# Patient Record
Sex: Male | Born: 1970
Health system: Southern US, Community
[De-identification: ages and names within clinical notes are randomized; demographics above are authoritative.]

## PROBLEM LIST (undated history)

## (undated) DIAGNOSIS — K219 Gastro-esophageal reflux disease without esophagitis: Secondary | ICD-10-CM

## (undated) DIAGNOSIS — E785 Hyperlipidemia, unspecified: Secondary | ICD-10-CM

## (undated) DIAGNOSIS — J45909 Unspecified asthma, uncomplicated: Secondary | ICD-10-CM

## (undated) DIAGNOSIS — M961 Postlaminectomy syndrome, not elsewhere classified: Secondary | ICD-10-CM

## (undated) DIAGNOSIS — I10 Essential (primary) hypertension: Secondary | ICD-10-CM

## (undated) DIAGNOSIS — G8929 Other chronic pain: Secondary | ICD-10-CM

## (undated) DIAGNOSIS — J189 Pneumonia, unspecified organism: Secondary | ICD-10-CM

## (undated) DIAGNOSIS — F32A Depression, unspecified: Secondary | ICD-10-CM

## (undated) DIAGNOSIS — Z9289 Personal history of other medical treatment: Secondary | ICD-10-CM

## (undated) DIAGNOSIS — F329 Major depressive disorder, single episode, unspecified: Secondary | ICD-10-CM

## (undated) DIAGNOSIS — T7840XA Allergy, unspecified, initial encounter: Secondary | ICD-10-CM

## (undated) HISTORY — DX: Unspecified asthma, uncomplicated: J45.909

## (undated) HISTORY — DX: Hyperlipidemia, unspecified: E78.5

## (undated) HISTORY — DX: Essential (primary) hypertension: I10

## (undated) HISTORY — DX: Personal history of other medical treatment: Z92.89

## (undated) HISTORY — PX: SPINAL FUSION: SHX223

## (undated) HISTORY — DX: Allergy, unspecified, initial encounter: T78.40XA

## (undated) HISTORY — PX: BACK SURGERY: SHX140

## (undated) HISTORY — DX: Postlaminectomy syndrome, not elsewhere classified: M96.1

## (undated) HISTORY — PX: ANKLE SURGERY: SHX546

---

## 1992-04-23 HISTORY — PX: HERNIA REPAIR: SHX51

## 2013-03-13 ENCOUNTER — Ambulatory Visit: Payer: Self-pay | Admitting: Internal Medicine

## 2013-04-23 DIAGNOSIS — J189 Pneumonia, unspecified organism: Secondary | ICD-10-CM

## 2013-04-23 HISTORY — DX: Pneumonia, unspecified organism: J18.9

## 2013-07-10 ENCOUNTER — Institutional Professional Consult (permissible substitution): Payer: Self-pay | Admitting: Internal Medicine

## 2013-07-17 ENCOUNTER — Ambulatory Visit (INDEPENDENT_AMBULATORY_CARE_PROVIDER_SITE_OTHER): Payer: BC Managed Care – PPO | Admitting: Internal Medicine

## 2013-07-17 ENCOUNTER — Encounter: Payer: Self-pay | Admitting: Internal Medicine

## 2013-07-17 VITALS — BP 136/80 | HR 88 | Temp 98.6°F | Ht 72.0 in | Wt 232.0 lb

## 2013-07-17 DIAGNOSIS — R058 Other specified cough: Secondary | ICD-10-CM

## 2013-07-17 DIAGNOSIS — R059 Cough, unspecified: Secondary | ICD-10-CM

## 2013-07-17 DIAGNOSIS — R05 Cough: Secondary | ICD-10-CM

## 2013-07-17 MED ORDER — TRAMADOL HCL 50 MG PO TABS: ORAL_TABLET | ORAL | Status: DC

## 2013-07-17 MED ORDER — PANTOPRAZOLE SODIUM 40 MG PO TBEC: 40.0000 mg | DELAYED_RELEASE_TABLET | Freq: Every day | ORAL | Status: DC

## 2013-07-17 MED ORDER — FAMOTIDINE 20 MG PO TABS: ORAL_TABLET | ORAL | Status: DC

## 2013-07-17 MED ORDER — PREDNISONE 10 MG PO TABS: ORAL_TABLET | ORAL | Status: DC

## 2013-07-17 MED ORDER — MONTELUKAST SODIUM 10 MG PO TABS: 10.0000 mg | ORAL_TABLET | Freq: Every day | ORAL | Status: DC

## 2013-07-17 NOTE — Patient Instructions (Addendum)
singulair 10 mg one each evening is a maintenance medication on trial   Only use albuterol if short of breath up to 2 every 4 hours   Prednisone 10 mg take  4 each am x 2 days,   2 each am x 2 days,  1 each am x 2 days and stop   Take delsym two tsp every 12 hours and supplement if needed with  tramadol 50 mg up to 2 every 4 hours to suppress the urge to cough. Swallowing water or using ice chips/non mint and menthol containing candies (such as lifesavers or sugarless jolly ranchers) are also effective.  You should rest your voice and avoid activities that you know make you cough.  Once you have eliminated the cough for 3 straight days try reducing the tramadol first,  then the delsym as tolerated.    Pantoprazole (protonix) 40 mg   Take 30-60 min before first meal of the day and Pepcid 20 mg one bedtime until return to office - this is the best way to tell whether stomach acid is contributing to your problem.    GERD (REFLUX)  is an extremely common cause of respiratory symptoms, many times with no significant heartburn at all.    It can be treated with medication, but also with lifestyle changes including avoidance of late meals, excessive alcohol, smoking cessation, and avoid fatty foods, chocolate, peppermint, colas, red wine, and acidic juices such as orange juice.  NO MINT OR MENTHOL PRODUCTS SO NO COUGH DROPS  USE SUGARLESS CANDY INSTEAD (jolley ranchers or Stover's)  NO OIL BASED VITAMINS - use powdered substitutes.    Please schedule a follow up office visit in 4 weeks, sooner if needed

## 2013-07-17 NOTE — Progress Notes (Addendum)
   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  Prev allergy eval in [place] [facility] cats  (n/a for him) referred 07/17/2013 by Dr Duane LopeAlan Ross for cough eval.    07/17/2013 1st  Pulmonary office visit/ Tanessa Tidd  Chief Complaint  Patient presents with  . Pulmonary Consult    Referred per Dr. Sigmund HazelLisa Miller. Pt c/o persistant cough for the past 3 years, but worse for the past year. He also c/o DOE with minimal exertion.    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   qvar not helping - already rx with zpak and prednisone.  Only thing that really knocks it out is tussionex.  Sob mostly just with coughing.  Cough tends to be worse after supper and disturbs sleep, never coughs up anything much at all  No obvious other patterns in day to day or daytime variabilty or assoc cp or chest tightness, subjective wheeze overt sinus or hb symptoms. No unusual exp hx or h/o childhood pna/ asthma or knowledge of premature birth.  Sleeping ok without nocturnal  or early am exacerbation  of respiratory  c/o's or need for noct saba. Also denies any obvious fluctuation of symptoms with weather or environmental changes or other aggravating or alleviating factors except as outlined above   Current Medications, Allergies, Complete Past Medical History, Past Surgical History, Family History, and Social History were reviewed in [facility] electronic medical record.             Review of Systems  Constitutional: Negative for  fever, chills, activity change, appetite change and unexpected weight change.  HENT: Positive for sore throat. Negative for congestion, dental problem, postnasal drip, rhinorrhea, sneezing, trouble swallowing and voice change.   Eyes: Negative for visual disturbance.  Respiratory: Positive for cough and shortness of breath. Negative for choking.   Cardiovascular: Negative for chest pain and leg swelling.  Gastrointestinal: Negative for nausea, vomiting and abdominal pain.  Genitourinary: Negative for difficulty urinating.  Musculoskeletal: Negative for arthralgias.  Skin: Negative for rash.  Psychiatric/Behavioral: Negative for behavioral problems and confusion.       Objective:   Physical Exam  amb wm nad  Wt Readings from Last 3 Encounters:  07/17/13 232 lb (105.235 kg)     HEENT: nl dentition, turbinates, and orophanx. Nl external ear canals without cough reflex   NECK :  without JVD/Nodes/TM/ nl carotid upstrokes bilaterally   LUNGS: no acc muscle use,  Cough on exp harsh barking dry quality  CV:  RRR  no s3 or murmur or increase in P2, no edema   ABD:  soft and nontender with nl excursion in the supine position. No bruits or organomegaly, bowel sounds nl  MS:  warm without deformities, calf tenderness, cyanosis or clubbing  SKIN: warm and dry without lesions    NEURO:  alert, approp, no deficits           Assessment & Plan:

## 2013-07-18 DIAGNOSIS — R05 Cough: Secondary | ICD-10-CM | POA: Insufficient documentation

## 2013-07-18 DIAGNOSIS — R058 Other specified cough: Secondary | ICD-10-CM | POA: Insufficient documentation

## 2013-07-18 NOTE — Assessment & Plan Note (Signed)
The most common causes of chronic cough in immunocompetent adults include the following: upper airway cough syndrome (UACS), previously referred to as postnasal drip syndrome (PNDS), which is caused by variety of rhinosinus conditions; (2) asthma; (3) GERD; (4) chronic bronchitis from cigarette smoking or other inhaled environmental irritants; (5) nonasthmatic eosinophilic bronchitis; and (6) bronchiectasis.   These conditions, singly or in combination, have accounted for up to 94% of the causes of chronic cough in prospective studies.   Other conditions have constituted no >6% of the causes in prospective studies These have included bronchogenic carcinoma, chronic interstitial pneumonia, sarcoidosis, left ventricular failure, ACEI-induced cough, and aspiration from a condition associated with pharyngeal dysfunction.    Chronic cough is often simultaneously caused by more than one condition. A single cause has been found from 38 to 82% of the time, multiple causes from 18 to 62%. Multiply caused cough has been the result of three diseases up to 42% of the time.       Based on hx and exam, this is most likely:  Classic Upper airway cough syndrome, so named because it's frequently impossible to sort out how much is  CR/sinusitis with freq throat clearing (which can be related to primary GERD)   vs  causing  secondary (" extra esophageal")  GERD from wide swings in gastric pressure that occur with throat clearing, often  promoting self use of mint and menthol lozenges that reduce the lower esophageal sphincter tone and exacerbate the problem further in a cyclical fashion.   These are the same pts (now being labeled as having "irritable larynx syndrome" by some cough centers) who not infrequently have a history of having failed to tolerate ace inhibitors,  dry powder inhalers or biphosphonates or report having atypical reflux symptoms that don't respond to standard doses of PPI , and are easily confused as  having aecopd or asthma flares by even experienced allergists/ pulmonologists.   The first step is to maximize acid suppression and eliminate cyclical coughing then regroup if the cough persists.  See instructions for specific recommendations which were reviewed directly with the patient who was given a copy with highlighter outlining the key components.    Will add singulair in case this is related to chronic rhinitis or cough variant asthma though suspect these may just be the "spark" not the fuel for the refractory cyclical coughing he's experiencing.

## 2013-08-11 ENCOUNTER — Telehealth: Payer: Self-pay | Admitting: Internal Medicine

## 2013-08-11 MED ORDER — TRAMADOL HCL 50 MG PO TABS: ORAL_TABLET | ORAL | Status: DC

## 2013-08-11 NOTE — Telephone Encounter (Signed)
Ok to refill x one 

## 2013-08-11 NOTE — Telephone Encounter (Signed)
Pt last had tramadol refilled 07/17/13 #40 x 0 refills Last ov 07/17/13 Pending 08/21/13 Please advise MW thanks

## 2013-08-11 NOTE — Telephone Encounter (Signed)
Per MW---ok to refill the tramadol x 1.  This has been called to the pts pharmacy and i have called and lmom to make him aware.

## 2013-08-14 ENCOUNTER — Ambulatory Visit: Payer: BC Managed Care – PPO | Admitting: Internal Medicine

## 2013-08-21 ENCOUNTER — Ambulatory Visit (INDEPENDENT_AMBULATORY_CARE_PROVIDER_SITE_OTHER): Payer: BC Managed Care – PPO | Admitting: Internal Medicine

## 2013-08-21 ENCOUNTER — Encounter: Payer: Self-pay | Admitting: Internal Medicine

## 2013-08-21 VITALS — BP 140/84 | HR 87 | Temp 97.8°F | Ht 72.0 in | Wt 237.8 lb

## 2013-08-21 DIAGNOSIS — R059 Cough, unspecified: Secondary | ICD-10-CM

## 2013-08-21 DIAGNOSIS — R058 Other specified cough: Secondary | ICD-10-CM

## 2013-08-21 DIAGNOSIS — R05 Cough: Secondary | ICD-10-CM

## 2013-08-21 MED ORDER — TRAMADOL HCL 50 MG PO TABS: ORAL_TABLET | ORAL | Status: DC

## 2013-08-21 NOTE — Patient Instructions (Addendum)
Plan A = Automatic = singulair 10 mg one daily x 3 months  Plan B = Backup  Short or wheeze > albuterol up 2pff every 4 hours if needed Cough > delsym/ tramadol  Plan C = contingency = Pantoprazole (protonix) 40 mg   Take 30-60 min before first meal of the day and Pepcid 20 mg one bedtime until return to office - this is the best way to tell whether stomach acid is contributing to your problem and if not better quickly return here asap  Please see patient coordinator before you leave today  to schedule sinus CT   Please schedule a follow up visit in 3 months but call sooner if needed

## 2013-08-21 NOTE — Progress Notes (Signed)
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  Prev allergy eval in [place] [facility] cats> dogs (n/a for him) referred 07/17/2013 by Dr Duane LopeAlan Ross for cough eval.   History of Present Illness  07/17/2013 1st [facility] office visit/ January Bergthold  Chief Complaint  Patient presents with  . Pulmonary Consult    Referred per Dr. Sigmund HazelLisa Miller. Pt c/o persistant cough for the past 3 years, but worse for the past year. He also c/o DOE with minimal exertion.    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   qvar not helping - already rx with zpak and prednisone.  Only thing that really knocks it out is tussionex.  Sob mostly just with coughing. Cough tends to be worse after supper and disturbs sleep, never coughs up anything much at all rec Prednisone 10 mg take  4 each am x 2 days,   2 each am x 2 days,  1 each am x 2 days and stop  Take delsym two tsp every 12 hours and supplement if needed with  tramadol 50 mg up to 2 every 4 hours to suppress the urge to cough.  . Once you have eliminated the cough for 3 straight days try reducing the tramadol first,  then the delsym as tolerated.   Pantoprazole (protonix) 40 mg   Take 30-60 min before first meal of the day and Pepcid 20 mg one bedtime singulair 10 mg daily   08/21/2013 f/u ov/Miriam Liles re: recurrent cough/  Chief Complaint  Patient presents with  . Follow-up    Pt states that his cough has almost resolved. His breathing has improved some, but  "sometimes feel like I am breathing under water".  Has not had to use rescue inhaler since his last visit.   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 No unusual exp hx or h/o childhood pna/ asthma or knowledge of premature birth.  Sleeping ok without nocturnal  or early am exacerbation  of respiratory  c/o's or need for noct saba. Also denies any obvious fluctuation of symptoms with weather or environmental changes or other aggravating or alleviating factors except as outlined above   Current Medications, Allergies, Complete Past Medical History, Past Surgical History, Family History, and Social History were reviewed in [facility] electronic medical record.  ROS  The following are not active complaints unless bolded sore throat, dysphagia, dental problems, itching, sneezing,  nasal congestion or excess/ purulent secretions, ear ache,   fever, chills, sweats, unintended wt loss, pleuritic or exertional cp, hemoptysis,  orthopnea pnd or leg swelling, presyncope, palpitations, heartburn, abdominal pain, anorexia, nausea, vomiting, diarrhea  or change in bowel or urinary habits, change in stools  or urine, dysuria,hematuria,  rash, arthralgias, visual complaints, headache, numbness weakness or ataxia or problems with walking or coordination,  change in mood/affect or memory.               .       Objective:   Physical Exam  amb wm nad  Wt Readings from Last 3 Encounters:  08/21/13 237 lb 12.8 oz (107.865 kg)  07/17/13 232 lb (105.235 kg)        HEENT: nl dentition, turbinates, and orophanx. Nl external ear canals without cough reflex   NECK :  without JVD/Nodes/TM/ nl carotid upstrokes bilaterally   LUNGS: no acc muscle use,  No longer cough  on exp  CV:  RRR  no s3 or murmur or increase in P2, no edema   ABD:  soft and nontender with nl excursion in the supine position. No bruits or organomegaly, bowel sounds nl  MS:  warm without deformities, calf tenderness, cyanosis or clubbing  SKIN: warm and dry without lesions    NEURO:  alert, approp, no deficits           Assessment & Plan:

## 2013-08-22 NOTE — Assessment & Plan Note (Signed)
-   rx singulair 07/18/2013 > improved   Also possible he has component of cough variant asthma but already failed qvar so no likely    Classic Upper airway cough syndrome, so named because it's frequently impossible to sort out how much is  CR/sinusitis with freq throat clearing (which can be related to primary GERD)   vs  causing  secondary (" extra esophageal")  GERD from wide swings in gastric pressure that occur with throat clearing, often  promoting self use of mint and menthol lozenges that reduce the lower esophageal sphincter tone and exacerbate the problem further in a cyclical fashion.   These are the same pts (now being labeled as having "irritable larynx syndrome" by some cough centers) who not infrequently have a history of having failed to tolerate ace inhibitors,  dry powder inhalers or biphosphonates or report having atypical reflux symptoms that don't respond to standard doses of PPI , and are easily confused as having aecopd or asthma flares by even experienced allergists/ pulmonologists.   For now continue singulair as maint and add back gerd rx at first sign of flare of cough  In meantime also needs sinus ct to complete the w/u   See instructions for specific recommendations which were reviewed directly with the patient who was given a copy with highlighter outlining the key components.

## 2013-08-28 ENCOUNTER — Ambulatory Visit (INDEPENDENT_AMBULATORY_CARE_PROVIDER_SITE_OTHER)
Admission: RE | Admit: 2013-08-28 | Discharge: 2013-08-28 | Disposition: A | Discharge status: Home / Self Care | Payer: BC Managed Care – PPO | Source: Ambulatory Visit | Attending: Internal Medicine | Admitting: Internal Medicine

## 2013-08-28 DIAGNOSIS — R058 Other specified cough: Secondary | ICD-10-CM

## 2013-08-28 DIAGNOSIS — R059 Cough, unspecified: Secondary | ICD-10-CM

## 2013-08-28 DIAGNOSIS — R05 Cough: Secondary | ICD-10-CM

## 2013-08-29 ENCOUNTER — Encounter: Payer: Self-pay | Admitting: Internal Medicine

## 2013-08-31 ENCOUNTER — Telehealth: Payer: Self-pay | Admitting: Internal Medicine

## 2013-08-31 NOTE — Telephone Encounter (Signed)
Call patient : Study is unremarkable, minimal fluid in sinuses and not enough to warrant ent eval or change in recs      Spoke with pt and notified of results per Dr. Sherene SiresWert. Pt verbalized understanding and denied any questions.

## 2013-08-31 NOTE — Progress Notes (Signed)
Quick Note:  LMTCB ______ 

## 2013-10-05 ENCOUNTER — Telehealth: Payer: Self-pay | Admitting: Internal Medicine

## 2013-10-05 MED ORDER — TRAMADOL HCL 50 MG PO TABS: ORAL_TABLET | ORAL | Status: DC

## 2013-10-05 NOTE — Telephone Encounter (Signed)
lmomtcb x1 

## 2013-10-05 NOTE — Telephone Encounter (Signed)
Refill/ ov before these run out

## 2013-10-05 NOTE — Telephone Encounter (Signed)
Returning call.

## 2013-10-05 NOTE — Telephone Encounter (Signed)
Last refill for tramadol was 08/21/13 #40 x 0 refills Last OV 08/21/13 No pending appt.  Please advise Dr. Sherene SiresWert thanks

## 2013-10-05 NOTE — Telephone Encounter (Signed)
lmtcb X1 to schedule rov with pt.  Tramadol has been called in to the pharmacy.

## 2013-10-06 NOTE — Telephone Encounter (Signed)
I called spoke with pt. Aware of recs. He reports he will have to call back for appt. Nothing further needed

## 2013-10-20 ENCOUNTER — Other Ambulatory Visit: Payer: Self-pay | Admitting: Orthopedic Surgery

## 2013-10-20 DIAGNOSIS — M25572 Pain in left ankle and joints of left foot: Secondary | ICD-10-CM

## 2013-11-23 ENCOUNTER — Encounter: Payer: Self-pay | Admitting: Internal Medicine

## 2013-11-23 ENCOUNTER — Ambulatory Visit (INDEPENDENT_AMBULATORY_CARE_PROVIDER_SITE_OTHER): Payer: BC Managed Care – PPO | Admitting: Internal Medicine

## 2013-11-23 VITALS — BP 144/87 | HR 96 | Ht 72.0 in | Wt 234.4 lb

## 2013-11-23 DIAGNOSIS — E785 Hyperlipidemia, unspecified: Secondary | ICD-10-CM

## 2013-11-23 NOTE — Progress Notes (Addendum)
HPI Patient is a 43 yo who is referred fro cardiac evaluation  He has a family history of CAD  (father with CABG in 60s, MI in 74s; paternal GF and uncle also with early CAD) The patient moved to [place] in past year.  Previously lived in [place] area.  He was followed by a cardiologist there [facility])  He has had stress tests, echo, blood work there.  Does not think he had a CT of heart.   He denies CP  Breathing is OK now  Was more physically actve until this year.  Injured foot, requiring surgery.  Hopes to get back to exercise one day.   Patient has had problems with recurrent bronchitis this year  Followd in pulmonary .  Prob with chronic coughing.   Allergies  Allergen Reactions  . Demerol [Meperidine]     unknown    Current Outpatient Prescriptions  Medication Sig Dispense Refill  . albuterol (PROAIR HFA) 108 (90 BASE) MCG/ACT inhaler Inhale 2 puffs into the lungs every 6 (six) hours as needed for wheezing or shortness of breath.      Marland Kitchen aspirin 81 MG tablet Take 81 mg by mouth daily.      Marland Kitchen atorvastatin (LIPITOR) 20 MG tablet Take 1 tablet by mouth daily.      . DULoxetine (CYMBALTA) 60 MG capsule Take 1 capsule by mouth daily.      . famotidine (PEPCID) 20 MG tablet Take 20 mg by mouth daily as needed. One at bedtime as needed      . fenofibrate (TRICOR) 145 MG tablet Take 1 tablet by mouth daily.      . finasteride (PROPECIA) 1 MG tablet Take 1 mg by mouth daily.      Marland Kitchen loratadine (CLARITIN) 10 MG tablet Take 10 mg by mouth daily.      . montelukast (SINGULAIR) 10 MG tablet Take 1 tablet (10 mg total) by mouth at bedtime.  30 tablet  11  . oxyCODONE-acetaminophen (PERCOCET/ROXICET) 5-325 MG per tablet Take 1 tablet by mouth every 6 (six) hours as needed.      . pantoprazole (PROTONIX) 40 MG tablet Take 40 mg by mouth daily as needed. Take 30-60 min before first meal of the day      . traMADol (ULTRAM) 50 MG tablet 1-2 every 4 hours as needed for cough or pain  40 tablet  0   . traMADol (ULTRAM) 50 MG tablet 1-2 every 4 hours as needed for cough or pain  40 tablet  0   No current facility-administered medications for this visit.    Past Medical History  Diagnosis Date  . Asthma   . Hyperlipidemia     Past Surgical History  Procedure Laterality Date  . Ankle surgery Left   . Hernia repair      Family History  Problem Relation Age of Onset  . Heart disease Father   . Heart attack Father   . Heart attack Paternal Grandfather   . Thyroid cancer Mother     History   Social History  . Marital Status: Married    Spouse Name: N/A    Number of Children: N/A  . Years of Education: N/A   Occupational History  . Attorney    Social History Main Topics  . Smoking status: Never Smoker   . Smokeless tobacco: Never Used  . Alcohol Use: Yes     Comment: once per wk  . Drug Use: No  . Sexual  Activity: Not on file   Other Topics Concern  . Not on file   Social History Narrative  . No narrative on file    Review of Systems:  All systems reviewed.  They are negative to the above problem except as previously stated.  Vital Signs: BP 144/87  Pulse 96  Ht 6' (1.829 m)  Wt 234 lb 6.4 oz (106.323 kg)  BMI 31.78 kg/m2  Physical Exam Patient is an obese 43 yo in NAD  HEENT:  Normocephalic, atraumatic. EOMI, PERRLA.  Neck: JVP is normal.  No bruits.  Lungs: clear to auscultation. No rales no wheezes.  Heart: Regular rate and rhythm. Normal S1, S2. No S3.   No significant murmurs. PMI not displaced.  Abdomen:  Supple, nontender. Normal bowel sounds. No masses. No hepatomegaly.  Extremities:   Good distal pulses throughout. No lower extremity edema.  Musculoskeletal :moving all extremities.  Neuro:   alert and oriented x3.  CN II-XII grossly intact.  EKG  SR 96 bpm  Nonspecific ST T wave changes.   Assessment and Plan:  1.  Cardiac risk evaluation.  Patient with no symptoms to suggest active ischemia.  He is anxious  Father died a few days  after given clinic visit (about 10 years post cabg). I have asked patient to sign release of info to get records from [place]  Will contact him once they arrive  2.  Dyslipidemia  Need to get records  3.  Obesity  pateint knows he has to lose wt  Hopes to increase actvitiy.   Addendum (12/21/13) Records mailed [facility]    [facility].  Syncopal spell  Felt due to comination of meds and EtOH Carottid USN normal  Stress echo normal.  Tele without arrhythmia.    Had coronary CT reported done in 02/2011 that was normal  No calcifications.    Chol levels :  HDL 42 LDL 70  Trig 215, Total 155.

## 2013-11-23 NOTE — Patient Instructions (Signed)
Your physician recommends that you continue on your current medications as directed. Please refer to the Current Medication list given to you today.  We will obtain medical records from [facility]. Dr. Tenny Crawoss will review these records and we will contact you after review for further instructions.

## 2013-11-27 ENCOUNTER — Ambulatory Visit: Payer: BC Managed Care – PPO | Admitting: Internal Medicine

## 2013-12-08 ENCOUNTER — Telehealth: Payer: Self-pay

## 2013-12-09 NOTE — Telephone Encounter (Signed)
Refill for one month I have not received records from [facility] in [place] Please let patient know this and send another refease of info to them so I can get those records to review

## 2013-12-10 ENCOUNTER — Other Ambulatory Visit: Payer: Self-pay

## 2013-12-10 MED ORDER — FENOFIBRATE 145 MG PO TABS: 145.0000 mg | ORAL_TABLET | Freq: Every day | ORAL | Status: DC

## 2013-12-16 ENCOUNTER — Telehealth: Payer: Self-pay | Admitting: Internal Medicine

## 2013-12-16 NOTE — Telephone Encounter (Signed)
New message     Pt saw Dr Tenny Craw 11-23-13.  He was to called and give previous doctors name and number for [place] to get notes.  The info is Dr Trey Paula Zaks---#[phone] and fax [phone].  He is waiting to hear from Dr Tenny Craw as soon as she reviews the notes.

## 2013-12-18 NOTE — Telephone Encounter (Signed)
Received records from medical records.  Given to Dr. Tenny Craw for review.

## 2014-01-14 ENCOUNTER — Other Ambulatory Visit: Payer: Self-pay | Admitting: Internal Medicine

## 2014-01-14 ENCOUNTER — Telehealth: Payer: Self-pay | Admitting: Internal Medicine

## 2014-01-14 DIAGNOSIS — Z79899 Other long term (current) drug therapy: Secondary | ICD-10-CM

## 2014-01-14 NOTE — Telephone Encounter (Signed)
New problem:    Per pt he would like to know what he needs to do going forward.   Pt wants to know, Does he need labs plus pt is worried about his fam.  hx.  Pt would really like a call back today.

## 2014-01-15 NOTE — Telephone Encounter (Signed)
Informed patient. He will come Monday am for labs. Order placed.  Appointment made.

## 2014-01-15 NOTE — Telephone Encounter (Signed)
Message copied by Lendon Ka on Fri Jan 15, 2014  5:55 PM ------      Message from: Pricilla Riffle      Created: Fri Jan 15, 2014  3:54 PM       Reviewed records      Testing in [place] (CT, Ultrasound) all looked good  Done a few years ago but being treated now for lipids.      Lipids looked good but they were from awhile ago.      I would keep on same regimen.  At his convenience should get fasting lipids again.        ----- Message -----         From: Lendon Ka, RN         Sent: 01/14/2014   2:18 PM           To: Pricilla Riffle, MD            Pt called today, asking for a plan.      Did you have a chance to review his scanned in records?      Filippa Yarbough       ------

## 2014-01-18 ENCOUNTER — Other Ambulatory Visit (INDEPENDENT_AMBULATORY_CARE_PROVIDER_SITE_OTHER): Payer: BC Managed Care – PPO | Admitting: *Deleted

## 2014-01-18 DIAGNOSIS — Z79899 Other long term (current) drug therapy: Secondary | ICD-10-CM

## 2014-01-18 LAB — LIPID PANEL
Cholesterol: 174 mg/dL (ref 0–200)
HDL: 34.8 mg/dL — ABNORMAL LOW (ref >39.00)
NonHDL: 139.2
Total CHOL/HDL Ratio: 5
Triglycerides: 246 mg/dL — ABNORMAL HIGH (ref 0.0–149.0)
VLDL: 49.2 mg/dL — ABNORMAL HIGH (ref 0.0–40.0)

## 2014-01-18 LAB — LDL CHOLESTEROL, DIRECT: Direct LDL: 109.6 mg/dL

## 2014-01-25 ENCOUNTER — Telehealth: Payer: Self-pay | Admitting: Internal Medicine

## 2014-01-25 MED ORDER — ATORVASTATIN CALCIUM 40 MG PO TABS: 40.0000 mg | ORAL_TABLET | Freq: Every day | ORAL | Status: DC

## 2014-01-25 NOTE — Telephone Encounter (Signed)
Follow Up  ° °Pt returned call for lab results  °

## 2014-01-25 NOTE — Telephone Encounter (Signed)
Patient informed of his labs. Advised i would send a new rx to [facility] for Lipitor 40 mg daily. He will double up on the 20 mg he has left. Could not schedule a date for his labs, as he travels for work. Will call back to schedule.

## 2014-02-01 ENCOUNTER — Encounter: Payer: Self-pay | Admitting: Internal Medicine

## 2014-02-01 ENCOUNTER — Ambulatory Visit (INDEPENDENT_AMBULATORY_CARE_PROVIDER_SITE_OTHER): Payer: BC Managed Care – PPO | Admitting: Internal Medicine

## 2014-02-01 VITALS — BP 114/76 | HR 80 | Temp 98.1°F | Ht 72.0 in | Wt 243.0 lb

## 2014-02-01 DIAGNOSIS — R05 Cough: Secondary | ICD-10-CM

## 2014-02-01 DIAGNOSIS — R058 Other specified cough: Secondary | ICD-10-CM

## 2014-02-01 MED ORDER — PREDNISONE 10 MG PO TABS: ORAL_TABLET | ORAL | Status: DC

## 2014-02-01 MED ORDER — AMOXICILLIN-POT CLAVULANATE 875-125 MG PO TABS: 1.0000 | ORAL_TABLET | Freq: Two times a day (BID) | ORAL | Status: DC

## 2014-02-01 MED ORDER — ACETAMINOPHEN-CODEINE #3 300-30 MG PO TABS
ORAL_TABLET | ORAL | Status: DC
Start: 2014-02-01 — End: 2014-02-18

## 2014-02-01 NOTE — Assessment & Plan Note (Addendum)
-   rx singulair 07/18/2013 > improved 08/21/13  - Sinus CT 08/28/13 > Sinuses are essentially clear with only minor dependent secretions noted in the inferior right frontal and left maxillary sinuses. There is anatomic narrowing of the ostiomeatal complexes, incompletely evaluated on this limited sinus study.   Classic Upper airway cough syndrome, so named because it's frequently impossible to sort out how much is  CR/sinusitis with freq throat clearing (which can be related to primary GERD)   vs  causing  secondary (" extra esophageal")  GERD from wide swings in gastric pressure that occur with throat clearing, often  promoting self use of mint and menthol lozenges that reduce the lower esophageal sphincter tone and exacerbate the problem further in a cyclical fashion.   These are the same pts (now being labeled as having "irritable larynx syndrome" by some cough centers) who not infrequently have a history of having failed to tolerate ace inhibitors,  dry powder inhalers or biphosphonates or report having atypical reflux symptoms that don't respond to standard doses of PPI , and are easily confused as having aecopd or asthma flares by even experienced allergists/ pulmonologists.  Explained natural history of uri and why it's necessary in patients at risk to treat GERD aggressively  at least  short term   to reduce risk of evolving cyclical cough initially  triggered by epithelial injury and a heightened sensitivty to the effects of any upper airway irritants,  most importantly acid - related.  That is, the more sensitive the epithelium damaged by resp virus, the more the cough, the more the secondary reflux (especially in those prone to reflux) the more the irritation of the sensitive mucosa and so on in a cyclical pattern.  He needs to eliminate this cycle with max gerd rx and stop the cyclical coughing  Could have underlying sinusitis given poor osteomeatal function on a good day > rx augmentin x 10 d  ?  Allergic component > Prednisone 10 mg take  4 each am x 2 days,   2 each am x 2 days,  1 each am x 2 days and stop   See instructions for specific recommendations which were reviewed directly with the patient who was given a copy with highlighter outlining the key components.

## 2014-02-01 NOTE — Patient Instructions (Addendum)
Take delsym two tsp every 12 hours and supplement if needed with tylenol #3 every 4 hours to suppress the urge to cough. Swallowing water or using ice chips/non mint and menthol containing candies (such as lifesavers or sugarless jolly ranchers) are also effective.  You should rest your voice and avoid activities that you know make you cough.  Once you have eliminated the cough for 3 straight days try reducing the tylenol # 3 first,  then the delsym as tolerated.    Augmentin 875 mg take one pill twice daily  X 10 days - take at breakfast and supper with large glass of water.  It would help reduce the usual side effects (diarrhea and yeast infections) if you ate cultured yogurt at lunch.   Prednisone 10 mg take  4 each am x 2 days,   2 each am x 2 days,  1 each am x 2 days and stop   Ear congestion > clariton D over the counter  Plan A = Automatic = singulair 10 mg one daily until return   Plan B = Backup  Short or wheeze > albuterol up 2pff every 4 hours if needed Cough > delsym/ tylenol #3 1 every 4 hours  Plan C = contingency = whenever you are having a respiratory flare:  Pantoprazole (protonix) 40 mg   Take 30-60 min before first meal of the day and Pepcid 20 mg one bedtime until 100% better  Please schedule a follow up office visit in 6 weeks, call sooner if needed

## 2014-02-01 NOTE — Progress Notes (Signed)
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  Prev allergy eval in [place] [facility] cats> dogs (n/a for him) referred 07/17/2013 by Luke Wong for cough eval.   History of Present Illness  07/17/2013 1st [facility] office visit/ Luke Wong  Chief Complaint  Patient presents with  . Pulmonary Consult    Referred per Luke Wong. Pt c/o persistant cough for the past 3 years, but worse for the past year. He also c/o DOE with minimal exertion.    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   qvar not helping - already rx with zpak and prednisone.  Only thing that really knocks it out is tussionex.  Sob mostly just with coughing. Cough tends to be worse after supper and disturbs sleep, never coughs up anything much at all rec Prednisone 10 mg take  4 each am x 2 days,   2 each am x 2 days,  1 each am x 2 days and stop  Take delsym two tsp every 12 hours and supplement if needed with  tramadol 50 mg up to 2 every 4 hours to suppress the urge to cough.  . Once you have eliminated the cough for 3 straight days try reducing the tramadol first,  then the delsym as tolerated.   Pantoprazole (protonix) 40 mg   Take 30-60 min before first meal of the day and Pepcid 20 mg one bedtime singulair 10 mg daily   08/21/2013 f/u ov/Luke Wong re: recurrent cough/  Chief Complaint  Patient presents with  . Follow-up    Pt states that his cough has almost resolved. His breathing has improved some, but  "sometimes feel like I am breathing under water".  Has not had to use rescue inhaler since his last visit.   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 asthma Chief Complaint  Patient presents with  . Acute Visit    Pt c/o chest congestion and cough for the past 6 days- started out with ear pain and his PCP gave zithromax.  Cough is non prod.   did not activate action plan. Feels cough rattling in chest but not productive. Using clariton for nasal/ ear congestion s benefit  and no better p zpak   No obvious day to day or daytime variabilty or assoc  cp or chest tightness, subjective wheeze overt sinus or hb symptoms. No unusual exp hx or h/o childhood pna/ asthma or knowledge of premature birth.  Sleeping ok without nocturnal  or early am exacerbation  of respiratory  c/o's or need for noct saba. Also denies any obvious fluctuation of symptoms with weather or environmental changes or other aggravating or alleviating factors except as outlined above   Current Medications, Allergies, Complete Past Medical History, Past Surgical History, Family History, and Social History were reviewed in [facility] electronic medical record.  ROS  The following are not active complaints unless bolded sore throat, dysphagia, dental problems, itching, sneezing,  nasal  congestion or excess/ purulent secretions, ear ache,   fever, chills, sweats, unintended wt loss, pleuritic or exertional cp, hemoptysis,  orthopnea pnd or leg swelling, presyncope, palpitations, heartburn, abdominal pain, anorexia, nausea, vomiting, diarrhea  or change in bowel or urinary habits, change in stools or urine, dysuria,hematuria,  rash, arthralgias, visual complaints, headache, numbness weakness or ataxia or problems with walking or coordination,  change in mood/affect or memory.               .       Objective:   Physical Exam  amb wm nad  02/01/2014      243  Wt Readings from Last 3 Encounters:  08/21/13 237 lb 12.8 oz (107.865 kg)  07/17/13 232 lb (105.235 kg)        HEENT: nl dentition, turbinates, and orophanx. Nl external ear canals without cough reflex   NECK :  without JVD/Nodes/TM/ nl carotid upstrokes bilaterally   LUNGS: no acc muscle use,  No cough on exp/ no wheeze  CV:  RRR  no s3 or murmur or increase in P2, no edema   ABD:  soft and nontender with nl excursion in the supine position. No bruits or organomegaly, bowel sounds nl  MS:  warm without deformities, calf tenderness, cyanosis or clubbing  SKIN: warm and dry without lesions    NEURO:  alert, approp, no deficits           Assessment & Plan:

## 2014-02-10 ENCOUNTER — Other Ambulatory Visit: Payer: Self-pay | Admitting: Internal Medicine

## 2014-02-18 ENCOUNTER — Telehealth: Payer: Self-pay | Admitting: Internal Medicine

## 2014-02-18 MED ORDER — AMOXICILLIN-POT CLAVULANATE 875-125 MG PO TABS: 1.0000 | ORAL_TABLET | Freq: Two times a day (BID) | ORAL | Status: DC

## 2014-02-18 MED ORDER — ACETAMINOPHEN-CODEINE #3 300-30 MG PO TABS: ORAL_TABLET | ORAL | Status: DC

## 2014-02-18 NOTE — Telephone Encounter (Signed)
lmtcb for pt.  

## 2014-02-18 NOTE — Telephone Encounter (Signed)
I am not here tomorrow so prefer he see Tammy but if declines ok to repeat augmentin x 10 days then ov with me

## 2014-02-18 NOTE — Telephone Encounter (Signed)
Ok with 40.

## 2014-02-18 NOTE — Telephone Encounter (Signed)
Pt states he had a fever slightly over 100F yesterday and his cough has come back. Pt doesn't feel well over all and can;t shake whatever this is going on. His Right ear is blocked for over 3 weeks still.   Allergies  Allergen Reactions  . Demerol [Meperidine]     unknown    MW please advise. Thanks.

## 2014-02-18 NOTE — Telephone Encounter (Signed)
Called and spoke to pt. Appt made with MW on 11/6 for f/u and augmentin sent in to preferred pharmacy.  Pt also now requesting Tylenol #3 refill for cough. Last filled on 10/12 for 1 tablet q4h prn. Refill for #40 and 0 refills.   Please advise.

## 2014-02-18 NOTE — Telephone Encounter (Signed)
rx for the tylenol #3 has been called to the pharmacy and i have called and made the pt aware.

## 2014-02-26 ENCOUNTER — Ambulatory Visit (INDEPENDENT_AMBULATORY_CARE_PROVIDER_SITE_OTHER): Payer: BC Managed Care – PPO | Admitting: Internal Medicine

## 2014-02-26 ENCOUNTER — Encounter: Payer: Self-pay | Admitting: Internal Medicine

## 2014-02-26 VITALS — BP 122/80 | HR 97 | Ht 72.0 in | Wt 242.0 lb

## 2014-02-26 DIAGNOSIS — R058 Other specified cough: Secondary | ICD-10-CM

## 2014-02-26 DIAGNOSIS — R05 Cough: Secondary | ICD-10-CM

## 2014-02-26 MED ORDER — PREDNISONE 10 MG PO TABS: ORAL_TABLET | ORAL | Status: DC

## 2014-02-26 NOTE — Patient Instructions (Addendum)
Prednisone 10 mg take  4 each am x 2 days,   2 each am x 2 days,  1 each am x 2 days and stop   I emphasized that nasal steroids(nasacort or fluticasone over the counter) have no immediate benefit in terms of improving symptoms.  To help them reached the target tissue, the patient should use Afrin two puffs every 12 hours applied one min before using the nasal steroids.  Afrin should be stopped after no more than 5 days.  If the symptoms worsen, Afrin can be restarted after 5 days off of therapy to prevent rebound congestion from overuse of Afrin.  I also emphasized that in no way are nasal steroids a concern in terms of "addiction".   Finish augmentin and singulair   Please see patient coordinator before you leave today  to schedule eval by Dr Suszanne Connerseoh

## 2014-02-26 NOTE — Progress Notes (Signed)
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  Prev allergy eval in [place] [facility] cats> dogs (n/a for him) referred 07/17/2013 by Dr Luke Wong for cough eval.   History of Present Illness  07/17/2013 1st [facility] office visit/ [name]  Chief Complaint  Patient presents with  . Pulmonary Consult    Referred per Dr. Sigmund HazelLisa Wong. Pt c/o persistant cough for the past 3 years, but worse for the past year. He also c/o DOE with minimal exertion.    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   qvar not helping - already rx with zpak and prednisone.  Only thing that really knocks it out is tussionex.  Sob mostly just with coughing. Cough tends to be worse after supper and disturbs sleep, never coughs up anything much at all rec Prednisone 10 mg take  4 each am x 2 days,   2 each am x 2 days,  1 each am x 2 days and stop  Take delsym two tsp every 12 hours and supplement if needed with  tramadol 50 mg up to 2 every 4 hours to suppress the urge to cough.  . Once you have eliminated the cough for 3 straight days try reducing the tramadol first,  then the delsym as tolerated.   Pantoprazole (protonix) 40 mg   Take 30-60 min before first meal of the day and Pepcid 20 mg one bedtime singulair 10 mg daily   08/21/2013 f/u ov/Luke Wong re: recurrent cough/  Chief Complaint  Patient presents with  . Follow-up    Pt states that his cough has almost resolved. His breathing has improved some, but  "sometimes feel like I am breathing under water".  Has not had to use rescue inhaler since his last visit.   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 asthma Chief Complaint  Patient presents with  . Acute Visit    Pt c/o chest congestion and cough for the past 6 days- started out with ear pain and his PCP gave zithromax.  Cough is non prod.   did not activate action plan. Feels cough rattling in chest but not productive. Using clariton for nasal/ ear congestion s  benefit and no better p zpak rec Take delsym two tsp every 12 hours and supplement if needed with tylenol #3 every 4 hours to suppress the urge to cough. Swallowing water or using ice chips/non mint and menthol containing candies (such as lifesavers or sugarless jolly ranchers) are also effective.  You should rest your voice and avoid activities that you know make you cough.  Once you have eliminated the cough for 3 straight days try reducing the tylenol # 3 first,  then the delsym as tolerated.    Augmentin 875 mg take one pill twice daily  X 10 days - take at breakfast and supper with large glass of water.  It would help reduce the usual side effects (diarrhea and yeast infections) if you ate cultured yogurt at lunch.   Prednisone 10 mg take  4 each am x 2 days,   2 each am x 2 days,  1 each  am x 2 days and stop   Ear congestion > clariton D over the counter Plan A = Automatic = singulair 10 mg one daily until return  Plan B = Backup  Short or wheeze > albuterol up 2pff every 4 hours if needed Cough > delsym/ tylenol #3 1 every 4 hours Plan C = contingency = whenever you are having a respiratory flare:  Pantoprazole (protonix) 40 mg   Take 30-60 min before first meal of the day and Pepcid 20 mg one bedtime until 100% better    02/26/2014 f/u ov/Luke Wong re: cough  On singulair for chronic rhinitis and ? Asthma plus gerd rx  Chief Complaint  Patient presents with  . Follow-up    Pt states that his cough has improved some.  He is still having popping in ear and diff with hearing, although this has also improved some.   cough was worse after supper until bed and up all night coughing > better now  Last well 4 weeks prior to OV   Prednisone works the best  Severe ear popping and pain when flies, esp on R  Doesn't think singulair helping  No need for saba      No obvious day to day or daytime variabilty or assoc  Sob or cp or chest tightness, subjective wheeze overt sinus or hb symptoms. No unusual exp hx or h/o childhood pna/ asthma or knowledge of premature birth.  Sleeping ok without nocturnal  or early am exacerbation  of respiratory  c/o's or need for noct saba. Also denies any obvious fluctuation of symptoms with weather or environmental changes or other aggravating or alleviating factors except as outlined above   Current Medications, Allergies, Complete Past Medical History, Past Surgical History, Family History, and Social History were reviewed in [facility] electronic medical record.  ROS  The following are not active complaints unless bolded sore throat, dysphagia, dental problems, itching, sneezing,  nasal congestion or excess/ purulent secretions, ear ache,   fever, chills, sweats, unintended wt loss, pleuritic or exertional cp, hemoptysis,  orthopnea pnd or leg  swelling, presyncope, palpitations, heartburn, abdominal pain, anorexia, nausea, vomiting, diarrhea  or change in bowel or urinary habits, change in stools or urine, dysuria,hematuria,  rash, arthralgias, visual complaints, headache, numbness weakness or ataxia or problems with walking or coordination,  change in mood/affect or memory.               .       Objective:   Physical Exam  amb wm nad  02/01/2014  243 > 02/26/2014 242  Wt Readings from Last 3 Encounters:  08/21/13 237 lb 12.8 oz (107.865 kg)  07/17/13 232 lb (105.235 kg)        HEENT: nl dentition, turbinates, and orophanx. Nl external ear canals without cough reflex/ R TM retracted but no def fluid/erythema   NECK :  without JVD/Nodes/TM/ nl carotid upstrokes bilaterally   LUNGS: no acc muscle use,  No cough on exp/ no wheeze  CV:  RRR  no s3 or murmur or increase in P2, no edema   ABD:  soft and nontender with nl excursion in the supine position. No bruits or organomegaly, bowel sounds nl  MS:  warm without deformities, calf tenderness, cyanosis or clubbing  SKIN: warm and dry without lesions              Assessment & Plan:

## 2014-02-27 NOTE — Assessment & Plan Note (Signed)
-   rx singulair 07/18/2013 > improved 08/21/13 > try off singulair 03/02/14  - Sinus CT 08/28/13 > Sinuses are essentially clear with only minor dependent secretions noted in the inferior right frontal and left maxillary sinuses. There is anatomic narrowing of the ostiomeatal complexes, incompletely evaluated on this limited sinus study.  Continues with more upper than lower airway concerns c/w chronic rhinitis and eustachian tube dysfunction with prednisone the only thing that really rhelps so worth trying max topical rx :  I reviewed  diagnosis and management of rhinitis including how to use topical steroids effectively and why it is necessary to treat nasal obstruction and inflammation and infection all at  the same time to eradicate the cycle of inflammation causing obstruction causing an infection causing inflammation and so forth and so on....  For now he's had enough abx which he should finish and stop singulair since not convinced now it's helping  Next step is ENT eval/ has seen allergist in past and declines re- referral.

## 2014-03-03 ENCOUNTER — Other Ambulatory Visit: Payer: Self-pay | Admitting: Internal Medicine

## 2014-03-03 NOTE — Telephone Encounter (Signed)
[facility]

## 2014-03-12 ENCOUNTER — Ambulatory Visit: Payer: BC Managed Care – PPO | Admitting: Internal Medicine

## 2014-03-13 ENCOUNTER — Encounter: Payer: Self-pay | Admitting: Internal Medicine

## 2014-03-13 DIAGNOSIS — H6983 Other specified disorders of Eustachian tube, bilateral: Secondary | ICD-10-CM | POA: Insufficient documentation

## 2014-03-22 ENCOUNTER — Telehealth: Payer: Self-pay | Admitting: Internal Medicine

## 2014-03-22 NOTE — Telephone Encounter (Signed)
I spoke with the pt and he states that his cough has returned and is worse then ever. He states he has finished all medication given by Dr. Sherene SiresWert and has seen Dr. Suszanne Connerseoh who prescribed another round of prednisone. Pt states while on the prednisone the cough is some better. Appt set for tomorrow at 4:30, pt requested this time. Carron CurieJennifer Cashtyn Pouliot, CMA

## 2014-03-23 ENCOUNTER — Encounter: Payer: Self-pay | Admitting: Adult Health

## 2014-03-23 ENCOUNTER — Encounter (INDEPENDENT_AMBULATORY_CARE_PROVIDER_SITE_OTHER): Payer: Self-pay

## 2014-03-23 ENCOUNTER — Ambulatory Visit (INDEPENDENT_AMBULATORY_CARE_PROVIDER_SITE_OTHER): Payer: BC Managed Care – PPO | Admitting: Adult Health

## 2014-03-23 VITALS — BP 132/82 | HR 85 | Temp 97.9°F | Ht 72.0 in | Wt 245.2 lb

## 2014-03-23 DIAGNOSIS — R058 Other specified cough: Secondary | ICD-10-CM

## 2014-03-23 DIAGNOSIS — R05 Cough: Secondary | ICD-10-CM

## 2014-03-23 MED ORDER — HYDROCODONE-HOMATROPINE 5-1.5 MG/5ML PO SYRP: 5.0000 mL | ORAL_SOLUTION | Freq: Four times a day (QID) | ORAL | Status: DC | PRN

## 2014-03-23 NOTE — Progress Notes (Signed)
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  [place] cats[place] dogs (n/a for him) referred 07/17/2013 by Dr Duane LopeAlan Ross for cough eval.   History of Present Illness  07/17/2013 1st [facility] office visit/ Wert  Chief Complaint  Patient presents with  . Pulmonary Consult    Referred per Dr. Sigmund HazelLisa Miller. Pt c/o persistant cough for the past 3 years, but worse for the past year. He also c/o DOE with minimal exertion.    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   qvar not helping - already rx with zpak and prednisone.  Only thing that really knocks it out is tussionex.  Sob mostly just with coughing. Cough tends to be worse after supper and disturbs sleep, never coughs up anything much at all rec Prednisone 10 mg take  4 each am x 2 days,   2 each am x 2 days,  1 each am x 2 days and stop  Take delsym two tsp every 12 hours and supplement if needed with  tramadol 50 mg up to 2 every 4 hours to suppress the urge to cough.  . Once you have eliminated the cough for 3 straight days try reducing the tramadol first,  then the delsym as tolerated.   Pantoprazole (protonix) 40 mg   Take 30-60 min before first meal of the day and Pepcid 20 mg one bedtime singulair 10 mg daily   08/21/2013 f/u ov/Wert re: recurrent cough/  Chief Complaint  Patient presents with  . Follow-up    Pt states that his cough has almost resolved. His breathing has improved some, but  "sometimes feel like I am breathing under water".  Has not had to use rescue inhaler since his last visit.   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 asthma Chief Complaint  Patient presents with  . Acute Visit    Pt c/o chest congestion and cough for the past 6 days- started out with ear pain and his PCP gave zithromax.  Cough is non prod.   did not activate action plan. Feels cough rattling in chest but not productive. Using clariton for nasal/ ear congestion s  benefit and no better p zpak rec Take delsym two tsp every 12 hours and supplement if needed with tylenol #3 every 4 hours to suppress the urge to cough. Swallowing water or using ice chips/non mint and menthol containing candies (such as lifesavers or sugarless jolly ranchers) are also effective.  You should rest your voice and avoid activities that you know make you cough.  Once you have eliminated the cough for 3 straight days try reducing the tylenol # 3 first,  then the delsym as tolerated.    Augmentin 875 mg take one pill twice daily  X 10 days - take at breakfast and supper with large glass of water.  It would help reduce the usual side effects (diarrhea and yeast infections) if you ate cultured yogurt at lunch.   Prednisone 10 mg take  4 each am x 2 days,   2 each am x 2 days,  1 each  am x 2 days and stop   Ear congestion > clariton D over the counter Plan A = Automatic = singulair 10 mg one daily until return  Plan B = Backup  Short or wheeze > albuterol up 2pff every 4 hours if needed Cough > delsym/ tylenol #3 1 every 4 hours Plan C = contingency = whenever you are having a respiratory flare:  Pantoprazole (protonix) 40 mg   Take 30-60 min before first meal of the day and Pepcid 20 mg one bedtime until 100% better    02/26/2014 f/u ov/Wert re: cough  On singulair for chronic rhinitis and ? Asthma plus gerd rx  Chief Complaint  Patient presents with  . Follow-up    Pt states that his cough has improved some.  He is still having popping in ear and diff with hearing, although this has also improved some.   cough was worse after supper until bed and up all night coughing > better now  Last well 4 weeks prior to OV   Prednisone works the best  Severe ear popping and pain when flies, esp on R  Doesn't think singulair helping  No need for saba  >>pred paper    03/23/2014 Acute OV  Patient returns for persistent coughing.  Complains of worsening cough that is more frequent and deeper x6 days. Ooccasionally produces mucus, wheezing, tightness/heaviness, HA, some low grade temp over the weekend while out of town.   Saw ENT and was started on pred, flonase and will followup again 12/7. Gets some better but cough never goes away completely.  Last antibiotic  was 1 month ago He denies any chest pain, orthopnea, PND, hemoptysis, edema, nausea, vomiting overt reflux, or leg swelling. Has been using several things for cough control, including Delsym Hydromet, Tylenol 3 and tramadol. Says that Hydromet works the best for his cough Has had several courses of steroids over the last year We discussed the potential side effects of frequent steroid use and frequent narcotic use. 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  Denies any unusual hobbies or pets. Does travel frequently to [place]. Has 1 dog. Lives in a rental home w/ no carpet.     Current Medications, Allergies, Complete Past Medical History, Past Surgical History, Family History, and Social History were reviewed in [facility] electronic medical record.  ROS  The following are not active complaints unless bolded sore throat, dysphagia, dental problems, itching, sneezing,  nasal congestion or excess/ purulent secretions,,  fever, chills, sweats, unintended wt loss, pleuritic or exertional cp, hemoptysis,  orthopnea pnd or leg swelling, presyncope, palpitations, heartburn, abdominal pain, anorexia, nausea, vomiting, diarrhea  or change in bowel or urinary habits, change in stools or urine, dysuria,hematuria,  rash, arthralgias, visual complaints, headache, numbness weakness or ataxia or problems with walking or coordination,  change in mood/affect or memory.               .       Objective:   Physical Exam  amb wm nad      HEENT: nl dentition, turbinates, and orophanx. Nl external ear canals without cough reflex/ R TM retracted but no def fluid/erythema   NECK :  without JVD/Nodes/TM/ nl carotid upstrokes bilaterally   LUNGS: no acc muscle use,  No wheezing or stridor , CTA   CV:  RRR  no s3 or murmur or increase in P2, no edema   ABD:  soft and nontender with nl excursion in the supine position. No bruits or organomegaly, bowel sounds nl  MS:  warm without deformities, calf tenderness, cyanosis or clubbing  SKIN: warm and dry without lesions              Assessment & Plan:

## 2014-03-23 NOTE — Patient Instructions (Signed)
Return for labs and chest x-ray Begin Delsym 2 teaspoons twice daily Begin Tessalon perles 1 Three times a day   May use Hydromet 1-2 teaspoons every 6 hours as needed for cough control, may make you sleepy Begin Chlortrimeton  4 mg 2 at bedtime May use Chlor-Trimeton 4 mg 1 every 4 hours as needed. For postnasal drip. Throat clearing and drainage, may make you sleepy Continue with Protonix daily before meal Continue with Pepcid at bedtime Use sips of water to help avoid coughing and throat clearing Use sugarless candy to help with cough Avoid all mint products Return in 3 weeks with Dr. Sherene SiresWert for pulmonary function test and As needed   Please contact office for sooner follow up if symptoms do not improve or worsen or seek emergency care

## 2014-03-23 NOTE — Assessment & Plan Note (Signed)
Persistent upper airway cough syndrome with recurrent flares despite aggressive trigger control.  Workup has been unrevealing w/ neg CT sinus and CXR 08/2013 . ENT referral w/ ET dysfunction  Needs repeat CXR and allergy profile with IgE .  Advised to use caution with narcotic use as dependence is a potential issue Steroid education given -avoid if possible   Plan  Return for labs and chest x-ray Begin Delsym 2 teaspoons twice daily Begin Tessalon perles 1 Three times a day   May use Hydromet 1-2 teaspoons every 6 hours as needed for cough control, may make you sleepy Begin Chlortrimeton  4 mg 2 at bedtime May use Chlor-Trimeton 4 mg 1 every 4 hours as needed. For postnasal drip. Throat clearing and drainage, may make you sleepy Continue with Protonix daily before meal Continue with Pepcid at bedtime Use sips of water to help avoid coughing and throat clearing Use sugarless candy to help with cough Avoid all mint products Return in 3 weeks with Dr. Sherene SiresWert for pulmonary function test and As needed   Please contact office for sooner follow up if symptoms do not improve or worsen or seek emergency care

## 2014-03-24 NOTE — Progress Notes (Signed)
emr chart notes and ov reviewed in detail and agree with a/p

## 2014-03-26 ENCOUNTER — Other Ambulatory Visit (INDEPENDENT_AMBULATORY_CARE_PROVIDER_SITE_OTHER): Payer: BC Managed Care – PPO

## 2014-03-26 ENCOUNTER — Ambulatory Visit (INDEPENDENT_AMBULATORY_CARE_PROVIDER_SITE_OTHER)
Admission: RE | Admit: 2014-03-26 | Discharge: 2014-03-26 | Disposition: A | Discharge status: Home / Self Care | Payer: BC Managed Care – PPO | Source: Ambulatory Visit | Attending: Adult Health | Admitting: Adult Health

## 2014-03-26 DIAGNOSIS — R05 Cough: Secondary | ICD-10-CM

## 2014-03-26 DIAGNOSIS — R058 Other specified cough: Secondary | ICD-10-CM

## 2014-03-27 LAB — CBC WITH DIFFERENTIAL/PLATELET
Basophils Absolute: 0 10*3/uL (ref 0.0–0.1)
Basophils Relative: 0.4 % (ref 0.0–3.0)
Eosinophils Absolute: 0.6 10*3/uL (ref 0.0–0.7)
Eosinophils Relative: 5.8 % — ABNORMAL HIGH (ref 0.0–5.0)
HCT: 44 % (ref 39.0–52.0)
Hemoglobin: 14.3 g/dL (ref 13.0–17.0)
Lymphocytes Relative: 29.6 % (ref 12.0–46.0)
Lymphs Abs: 2.9 10*3/uL (ref 0.7–4.0)
MCHC: 32.4 g/dL (ref 30.0–36.0)
MCV: 87.1 fl (ref 78.0–100.0)
Monocytes Absolute: 0.6 10*3/uL (ref 0.1–1.0)
Monocytes Relative: 6.4 % (ref 3.0–12.0)
Neutro Abs: 5.6 10*3/uL (ref 1.4–7.7)
Neutrophils Relative %: 57.8 % (ref 43.0–77.0)
Platelets: 407 10*3/uL — ABNORMAL HIGH (ref 150.0–400.0)
RBC: 5.05 Mil/uL (ref 4.22–5.81)
RDW: 13.8 % (ref 11.5–15.5)
WBC: 9.7 10*3/uL (ref 4.0–10.5)

## 2014-03-29 LAB — ALLERGY FULL PROFILE
Allergen, D pternoyssinus,d7: 4.17 kU/L — ABNORMAL HIGH
Allergen,Goose feathers, e70: <0.1 kU/L
Alternaria Alternata: <0.1 kU/L
Aspergillus fumigatus, m3: <0.1 kU/L
Bahia Grass: <0.1 kU/L
Bermuda Grass: <0.1 kU/L
Box Elder IgE: <0.1 kU/L
Candida Albicans: <0.1 kU/L
Cat Dander: 3.81 kU/L — ABNORMAL HIGH
Common Ragweed: 0.31 kU/L — ABNORMAL HIGH
Curvularia lunata: <0.1 kU/L
D. farinae: 5.09 kU/L — ABNORMAL HIGH
Dog Dander: 3.29 kU/L — ABNORMAL HIGH
Elm IgE: <0.1 kU/L
Fescue: <0.1 kU/L
G005 Rye, Perennial: <0.1 kU/L
G009 Red Top: <0.1 kU/L
Goldenrod: <0.1 kU/L
Helminthosporium halodes: <0.1 kU/L
House Dust Hollister: 2.56 kU/L — ABNORMAL HIGH
IgE (Immunoglobulin E), Serum: 87 kU/L (ref <115)
Lamb's Quarters: <0.1 kU/L
Oak: <0.1 kU/L
Plantain: <0.1 kU/L
Stemphylium Botryosum: <0.1 kU/L
Sycamore Tree: <0.1 kU/L
Timothy Grass: <0.1 kU/L

## 2014-04-15 ENCOUNTER — Other Ambulatory Visit: Payer: Self-pay | Admitting: Internal Medicine

## 2014-04-19 ENCOUNTER — Ambulatory Visit (INDEPENDENT_AMBULATORY_CARE_PROVIDER_SITE_OTHER): Payer: BC Managed Care – PPO | Admitting: Internal Medicine

## 2014-04-19 ENCOUNTER — Encounter: Payer: Self-pay | Admitting: Internal Medicine

## 2014-04-19 ENCOUNTER — Other Ambulatory Visit: Payer: Self-pay | Admitting: *Deleted

## 2014-04-19 VITALS — BP 114/82 | HR 98 | Ht 72.0 in | Wt 242.0 lb

## 2014-04-19 DIAGNOSIS — R05 Cough: Secondary | ICD-10-CM

## 2014-04-19 DIAGNOSIS — R058 Other specified cough: Secondary | ICD-10-CM

## 2014-04-19 LAB — PULMONARY FUNCTION TEST
DL/VA % pred: 112 %
DL/VA: 5.39 ml/min/mmHg/L
DLCO unc % pred: 98 %
DLCO unc: 34.61 ml/min/mmHg
FEF 25-75 Post: 3.36 L/sec
FEF 25-75 Pre: 4.74 L/sec
FEF2575-%Change-Post: -29 %
FEF2575-%Pred-Post: 82 %
FEF2575-%Pred-Pre: 116 %
FEV1-%Change-Post: -9 %
FEV1-%Pred-Post: 84 %
FEV1-%Pred-Pre: 92 %
FEV1-Post: 3.73 L
FEV1-Pre: 4.1 L
FEV1FVC-%Change-Post: 0 %
FEV1FVC-%Pred-Pre: 107 %
FEV6-%Change-Post: -9 %
FEV6-%Pred-Post: 80 %
FEV6-%Pred-Pre: 89 %
FEV6-Post: 4.39 L
FEV6-Pre: 4.85 L
FEV6FVC-%Pred-Post: 103 %
FEV6FVC-%Pred-Pre: 103 %
FVC-%Change-Post: -9 %
FVC-%Pred-Post: 78 %
FVC-%Pred-Pre: 86 %
FVC-Post: 4.39 L
FVC-Pre: 4.85 L
Post FEV1/FVC ratio: 85 %
Post FEV6/FVC ratio: 100 %
Pre FEV1/FVC ratio: 85 %
Pre FEV6/FVC Ratio: 100 %
RV % pred: 96 %
RV: 1.91 L
TLC % pred: 90 %
TLC: 6.63 L

## 2014-04-19 MED ORDER — HYDROCODONE-HOMATROPINE 5-1.5 MG/5ML PO SYRP: 5.0000 mL | ORAL_SOLUTION | ORAL | Status: DC | PRN

## 2014-04-19 MED ORDER — PANTOPRAZOLE SODIUM 40 MG PO TBEC: 40.0000 mg | DELAYED_RELEASE_TABLET | Freq: Every day | ORAL | Status: DC | PRN

## 2014-04-19 NOTE — Patient Instructions (Addendum)
Continue Pantoprazole (protonix) 40 mg   Take 30-60 min before first meal of the day and Pepcid 20 mg one bedtime until return to office - this is the best way to tell whether stomach acid is contributing to your problem.    Prednisone 10 mg take  4 each am x 2 days,   2 each am x 2 days,  1 each am x 2 days and stop   For drainage take chlortrimeton (chlorpheniramine) 4 mg every 4 hours available over the counter (may cause drowsiness)   Hycodan 1 tsp every 4 hours as needed for cough   Return if cough not better  Late add ? Trial of neurotin if cyclical cough pattern continues though might consider methacholine challenge first

## 2014-04-19 NOTE — Progress Notes (Signed)
PFT done. Garielle Mroz, CMA  

## 2014-04-19 NOTE — Progress Notes (Signed)
Assessment:     Plan:

## 2014-04-20 NOTE — Progress Notes (Signed)
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  Prev allergy eval in [place] cats> dogs (n/a for him) referred 07/17/2013 by Dr Duane Lope for cough eval.   History of Present Illness  07/17/2013 1st  [facility] office visit/ Luke Wong  Chief Complaint  Patient presents with  . Pulmonary Consult    Referred per Dr. Sigmund Hazel. Pt c/o persistant cough for the past 3 years, but worse for the past year. He also c/o DOE with minimal exertion.    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   qvar not helping - already rx with zpak and prednisone.  Only thing that really knocks it out is tussionex.  Sob mostly just with coughing. Cough tends to be worse after supper and disturbs sleep, never coughs up anything much at all rec Prednisone 10 mg take  4 each am x 2 days,   2 each am x 2 days,  1 each am x 2 days and stop  Take delsym two tsp every 12 hours and supplement if needed with  tramadol 50 mg up to 2 every 4 hours to suppress the urge to cough.  . Once you have eliminated the cough for 3 straight days try reducing the tramadol first,  then the delsym as tolerated.   Pantoprazole (protonix) 40 mg   Take 30-60 min before first meal of the day and Pepcid 20 mg one bedtime singulair 10 mg daily   08/21/2013 f/u ov/Luke Wong re: recurrent cough/  Chief Complaint  Patient presents with  . Follow-up    Pt states that his cough has almost resolved. His breathing has improved some, but "sometimes  feel like I am breathing under water".  Has not had to use rescue inhaler since his last visit.   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 asthma Chief Complaint  Patient presents with  . Acute Visit    Pt c/o chest congestion and cough for the past 6 days- started out with ear pain and his PCP gave zithromax.  Cough is non prod.   did not activate action plan. Feels cough rattling in chest but not productive. Using clariton for nasal/ ear congestion s benefit  and no better p zpak rec Take delsym two tsp every 12 hours and supplement if needed with tylenol #3 every 4 hours to suppress the urge to cough.   Augmentin 875 mg take one pill twice daily  X 10 days - take at breakfast and supper with large glass of water.  It would help reduce the usual side effects (diarrhea and yeast infections) if you ate cultured yogurt at lunch.  Prednisone 10 mg take  4 each am x 2 days,   2 each am x 2 days,  1 each am x 2 days and stop  Ear congestion > clariton D over the counter Plan A = Automatic = singulair 10 mg one daily until return  Plan B = Backup  Short or wheeze > albuterol up 2pff every 4 hours if needed Cough > delsym/ tylenol #3 1 every 4 hours Plan C = contingency = whenever you are having a respiratory flare:  Pantoprazole (protonix) 40 mg   Take  30-60 min before first meal of the day and Pepcid 20 mg one bedtime until 100% better    02/26/2014 f/u ov/Luke Wong re: cough  On singulair for chronic rhinitis and ? Asthma plus gerd rx  Chief Complaint  Patient presents with  . Follow-up    Pt states that his cough has improved some.  He is still having popping in ear and diff with hearing, although this has also improved some.   cough was worse after supper until bed and up all night coughing > better now  Last well 4 weeks prior to OV   Prednisone works the best  Severe ear popping and pain when flies, esp on R  Doesn't think singulair helping  No need for saba  >>pred paper    03/23/2014 Acute OV  Patient returns for persistent coughing.  Complains of worsening cough that is more frequent and deeper x6 days. Ooccasionally produces mucus, wheezing, tightness/heaviness, HA, some low grade temp over the weekend while out of town.   Saw ENT and was started on pred, flonase and will followup again 12/7. Gets some better but cough never goes away completely.  Last antibiotic  was 1 month ago He denies any chest pain, orthopnea, PND, hemoptysis, edema, nausea, vomiting overt reflux, or leg swelling. Has been using several things for cough control, including Delsym Hydromet, Tylenol 3 and tramadol. Says that Hydromet works the best for his cough Has had several courses of steroids over the last year We discussed the potential side effects of frequent steroid use and frequent narcotic use. 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 Denies any unusual hobbies or pets. Does travel frequently to [place]. Has 1 dog. Lives in a rental home w/ no carpet.  rec Return for labs and chest x-ray Begin Delsym 2 teaspoons twice daily Begin Tessalon perles 1 Three times a day   May use Hydromet 1-2 teaspoons every 6 hours as needed for cough control, may make you sleepy Begin Chlortrimeton  4  mg 2 at bedtime May use Chlor-Trimeton 4 mg 1 every 4 hours as needed. For postnasal drip. Throat clearing and drainage, may make you sleepy Continue with Protonix daily before meal Continue with Pepcid at bedti   ENT > R Tympanic tube early Dec 2015    04/19/2014 f/u ov/Luke Wong re: recurrent cough x 2 months  Chief Complaint  Patient presents with  . Follow-up  PFT done today. Pt states that his cough is some better.  No new co's.   cough worse as day goes on at peaks at hs despite max gerd rx Coughed up some green last week  Not limited by breathing from desired activities   Not convinced resp to saba or worse off singulair   No obvious day to day or daytime variabilty or assoc chronic cough or cp or chest tightness, subjective wheeze overt  hb symptoms. No unusual exp hx or h/o childhood pna/ asthma or knowledge of premature birth.  Sleeping ok without nocturnal  or early am exacerbation  of respiratory  c/o's or need for noct saba. Also denies any obvious fluctuation of symptoms with weather or environmental changes or other aggravating or alleviating factors except as outlined above   Current Medications, Allergies, Complete Past Medical History, Past Surgical History, Family History, and Social History were reviewed in [facility] electronic medical record.  ROS  The following are not active complaints unless bolded sore throat, dysphagia, dental problems, itching, sneezing,  nasal congestion or excess/ purulent secretions, ear ache,   fever, chills, sweats, unintended wt loss, pleuritic or exertional cp, hemoptysis,  orthopnea pnd or leg swelling, presyncope, palpitations, heartburn, abdominal pain, anorexia, nausea, vomiting, diarrhea  or change in bowel or urinary habits, change in stools or urine, dysuria,hematuria,  rash, arthralgias, visual complaints, headache, numbness weakness or ataxia or problems with walking or coordination,  change in mood/affect or memory.                        .       Objective:   Physical Exam  amb wm nad  Wt Readings from Last 3 Encounters:  04/19/14 242 lb (109.77 kg)  03/23/14 245 lb 3.2 oz (111.222 kg)  02/26/14 242 lb (109.77 kg)    Vital signs reviewed       HEENT: nl dentition, turbinates, and orophanx. Nl external ear canals without cough reflex/ R TM now light reflex, tympanic tube in good position   NECK :  without JVD/Nodes/TM/ nl carotid upstrokes bilaterally   LUNGS: no acc muscle use,  No wheezing or stridor , CTA   CV:  RRR  no s3 or murmur or increase in P2, no edema   ABD:  soft and nontender with nl excursion in the supine position. No bruits or organomegaly, bowel sounds nl  MS:  warm without deformities, calf tenderness, cyanosis or clubbing  SKIN: warm and dry without lesions      cxr 03/26/14 Allowing for hypoinflation there is no significant change in the appearance of the chest since the previous study. No acute cardiopulmonary abnormality is demonstrated.            Assessment & Plan:

## 2014-04-20 NOTE — Assessment & Plan Note (Signed)
-   rx singulair 07/18/2013 > improved 08/21/13 > try off singulair 03/02/14 as pt not convinced helped - Sinus CT 08/28/13 > Sinuses are essentially clear with only minor dependent secretions noted in the inferior right frontal and left maxillary sinuses. There is anatomic narrowing of the ostiomeatal complexes, incompletely evaluated on this limited sinus study. 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 Trial of neurotin if cyclical cough pattern continues though might consider methacholine challenge first

## 2014-04-27 ENCOUNTER — Other Ambulatory Visit: Payer: Self-pay | Admitting: Cardiology

## 2014-05-06 ENCOUNTER — Other Ambulatory Visit: Payer: Self-pay | Admitting: Internal Medicine

## 2014-05-25 DIAGNOSIS — M899 Disorder of bone, unspecified: Secondary | ICD-10-CM | POA: Insufficient documentation

## 2014-05-25 DIAGNOSIS — S93402A Sprain of unspecified ligament of left ankle, initial encounter: Secondary | ICD-10-CM | POA: Insufficient documentation

## 2014-07-26 ENCOUNTER — Telehealth: Payer: Self-pay | Admitting: Internal Medicine

## 2014-07-26 ENCOUNTER — Encounter: Payer: Self-pay | Admitting: Adult Health

## 2014-07-26 ENCOUNTER — Ambulatory Visit (INDEPENDENT_AMBULATORY_CARE_PROVIDER_SITE_OTHER): Payer: BLUE CROSS/BLUE SHIELD | Admitting: Adult Health

## 2014-07-26 VITALS — BP 110/70 | HR 89 | Temp 98.5°F | Ht 72.0 in | Wt 246.0 lb

## 2014-07-26 DIAGNOSIS — R05 Cough: Secondary | ICD-10-CM | POA: Diagnosis not present

## 2014-07-26 DIAGNOSIS — R058 Other specified cough: Secondary | ICD-10-CM

## 2014-07-26 MED ORDER — PREDNISONE 10 MG PO TABS: ORAL_TABLET | ORAL | Status: DC

## 2014-07-26 MED ORDER — HYDROCODONE-HOMATROPINE 5-1.5 MG/5ML PO SYRP
5.0000 mL | ORAL_SOLUTION | Freq: Four times a day (QID) | ORAL | Status: DC | PRN
Start: 2014-07-26 — End: 2014-11-20

## 2014-07-26 NOTE — Progress Notes (Addendum)
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  Prev allergy eval in [place] cats> dogs (n/a for him) referred 07/17/2013 by Dr Duane Lope for cough eval.   History of Present Illness  07/17/2013 1st [facility] office visit/ Wert  Chief Complaint  Patient presents with  . Pulmonary Consult    Referred per Dr. Sigmund Hazel. Pt c/o persistant cough for the past 3 years, but worse for the past year. He also c/o DOE with minimal exertion.    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   qvar not helping - already rx with zpak and prednisone.  Only thing that really knocks it out is tussionex.  Sob mostly just with coughing. Cough tends to be worse after supper and disturbs sleep, never coughs up anything much at all rec Prednisone 10 mg take  4 each am x 2 days,   2 each am x 2 days,  1 each am x 2 days and stop  Take delsym two tsp every 12 hours and supplement if needed with  tramadol 50 mg up to 2 every 4 hours to suppress the urge to cough.  . Once you have eliminated the cough for 3 straight days try reducing the tramadol first,  then the delsym as tolerated.   Pantoprazole (protonix) 40 mg   Take 30-60 min before first meal of the day and Pepcid 20 mg one bedtime singulair 10 mg daily   08/21/2013 f/u ov/Wert re: recurrent cough/  Chief Complaint  Patient presents with  . Follow-up    Pt states that his cough has almost resolved. His breathing has improved some, but "sometimes  feel like I am breathing under water".  Has not had to use rescue inhaler since his last visit.   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 asthma Chief Complaint  Patient presents with  . Acute Visit    Pt c/o chest congestion and cough for the past 6 days- started out with ear pain and his PCP gave zithromax.  Cough is non prod.   did not activate action plan. Feels cough rattling in chest but not productive. Using clariton for nasal/ ear congestion s benefit  and no better p zpak rec Take delsym two tsp every 12 hours and supplement if needed with tylenol #3 every 4 hours to suppress the urge to cough.   Augmentin 875 mg take one pill twice daily  X 10 days - take at breakfast and supper with large glass of water.  It would help reduce the usual side effects (diarrhea and yeast infections) if you ate cultured yogurt at lunch.  Prednisone 10 mg take  4 each am x 2 days,   2 each am x 2 days,  1 each am x 2 days and stop  Ear congestion > clariton D over the counter Plan A = Automatic = singulair 10 mg one daily until return  Plan B = Backup  Short or wheeze > albuterol up 2pff every 4 hours if needed Cough > delsym/ tylenol #3 1 every 4 hours Plan C = contingency = whenever you are having a respiratory flare:  Pantoprazole (protonix) 40 mg   Take  30-60 min before first meal of the day and Pepcid 20 mg one bedtime until 100% better    02/26/2014 f/u ov/Wert re: cough  On singulair for chronic rhinitis and ? Asthma plus gerd rx  Chief Complaint  Patient presents with  . Follow-up    Pt states that his cough has improved some.  He is still having popping in ear and diff with hearing, although this has also improved some.   cough was worse after supper until bed and up all night coughing > better now  Last well 4 weeks prior to OV   Prednisone works the best  Severe ear popping and pain when flies, esp on R  Doesn't think singulair helping  No need for saba  >>pred paper    03/23/2014 Acute OV  Patient returns for persistent coughing.  Complains of worsening cough that is more frequent and deeper x6 days. Ooccasionally produces mucus, wheezing, tightness/heaviness, HA, some low grade temp over the weekend while out of town.   Saw ENT and was started on pred, flonase and will followup again 12/7. Gets some better but cough never goes away completely.  Last antibiotic  was 1 month ago He denies any chest pain, orthopnea, PND, hemoptysis, edema, nausea, vomiting overt reflux, or leg swelling. Has been using several things for cough control, including Delsym Hydromet, Tylenol 3 and tramadol. Says that Hydromet works the best for his cough Has had several courses of steroids over the last year We discussed the potential side effects of frequent steroid use and frequent narcotic use. 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 Denies any unusual hobbies or pets. Does travel frequently to [place]. Has 1 dog. Lives in a rental home w/ no carpet.  rec Return for labs and chest x-ray Begin Delsym 2 teaspoons twice daily Begin Tessalon perles 1 Three times a day   May use Hydromet 1-2 teaspoons every 6 hours as needed for cough control, may make you sleepy Begin Chlortrimeton  4  mg 2 at bedtime May use Chlor-Trimeton 4 mg 1 every 4 hours as needed. For postnasal drip. Throat clearing and drainage, may make you sleepy Continue with Protonix daily before meal Continue with Pepcid at bedti   ENT > R Tympanic tube early Dec 2015    04/19/2014 f/u ov/Wert re: recurrent cough x 2 months  Chief Complaint  Patient presents with  . Follow-up  PFT done today. Pt states that his cough is some better.  No new co's.   cough worse as day goes on at peaks at hs despite max gerd rx Coughed up some green last week  Not limited by breathing from desired activities   Not convinced resp to saba or worse off singulair  >>PPI/Pepcid/pred pack and hycodan rx   07/26/2014 Acute OV  Presents for an acute office visit.  Complains of cough for last 4 days. Minimally productive.  No fever, discolored mucus , chest pain, orthopnea, n/v/d.  Seen last ov for chronic cough , tx w/ steroids , PPI/Pepcid and chlortrimeton . Says cough went totally away for last 3 months . Feels he caught a cold last week and cough started back . He restarted protonix . Does not have any cough syrup.  Had recent ankle surgery and trip [place]. No calf pain, chest pain or hemoptysis.  He is leaving to go [place]OT for work in 3 days. Wants to get  Better before leaving.  Previous PFT and cxr nml .  No sign sinus congestion.     Current Medications, Allergies, Complete Past Medical History, Past Surgical History, Family History, and Social History were reviewed in [facility] electronic medical record.  ROS  The following are not active complaints unless bolded sore throat, dysphagia, dental problems, itching, sneezing,  nasal congestion or excess/ purulent secretions, ear ache,   fever, chills, sweats, unintended wt loss, pleuritic or exertional cp, hemoptysis,  orthopnea pnd or leg swelling, presyncope, palpitations, heartburn, abdominal pain, anorexia, nausea, vomiting, diarrhea  or change in bowel or urinary  habits, change in stools or urine, dysuria,hematuria,  rash, arthralgias, visual complaints, headache, numbness weakness or ataxia or problems with walking or coordination,  change in mood/affect or memory.            Objective:   Physical Exam  amb wm nad     Vital signs reviewed       HEENT: nl dentition, turbinates, and orophanx. Nl external ear canals without cough reflex/ R TM now light reflex, tympanic tube in good position,  nontender sinus    NECK :  without JVD/Nodes/TM/ nl carotid upstrokes bilaterally   LUNGS: no acc muscle use,  No wheezing or stridor , CTA   CV:  RRR  no s3 or murmur or increase in P2, no edema , neg calf pain, neg homans sign   ABD:  soft and nontender with nl excursion in the supine position. No bruits or organomegaly, bowel sounds nl  MS:  warm without deformities, calf tenderness, cyanosis or clubbing  SKIN: warm and dry without lesions      cxr 03/26/14 Allowing for hypoinflation there is no significant change in the appearance of the chest since the previous study. No acute cardiopulmonary abnormality is demonstrated.            Assessment & Plan:

## 2014-07-26 NOTE — Patient Instructions (Addendum)
Restart  Pantoprazole (protonix) 40 mg   Take 30-60 min before first meal of the day and Pepcid 20 mg one bedtime until return to office - this is the best way to tell whether stomach acid is contributing to your problem.   Prednisone taper over next week.  Delsym 2 tsp Twice daily  As needed  Cough .  For drainage take chlortrimeton (chlorpheniramine) 4 mg every 4 hours available over the counter (may cause drowsiness)  Hycodan 1 tsp every 4 hours as needed for cough , may make you sleepy.  Follow up Dr. Sherene SiresWert  In 6-8 weeks and As needed   Please contact office for sooner follow up if symptoms do not improve or worsen or seek emergency care

## 2014-07-26 NOTE — Telephone Encounter (Signed)
Spoke with patient-states he had a cold last week and feels he is getting worse. Increased deep chest cough-slightly productive-unsure if any color; and is set to go out of town for about 2 weeks for work. Pt wants to be seen today; pt was offered and accepted OV with TP at 3:00pm today. Nothing more needed at this time.

## 2014-07-26 NOTE — Assessment & Plan Note (Signed)
Flare of chronic cough with URI  Hold on abx at this time  Plan  Restart  Pantoprazole (protonix) 40 mg   Take 30-60 min before first meal of the day and Pepcid 20 mg one bedtime until return to office - this is the best way to tell whether stomach acid is contributing to your problem.   Prednisone taper over next week.  Delsym 2 tsp Twice daily  As needed  Cough .  For drainage take chlortrimeton (chlorpheniramine) 4 mg every 4 hours available over the counter (may cause drowsiness)  Hycodan 1 tsp every 4 hours as needed for cough , may make you sleepy.  Follow up Dr. Sherene SiresWert  In 6-8 weeks and As needed   Please contact office for sooner follow up if symptoms do not improve or worsen or seek emergency care

## 2014-08-03 ENCOUNTER — Other Ambulatory Visit: Payer: Self-pay | Admitting: Internal Medicine

## 2014-08-23 ENCOUNTER — Encounter: Payer: Self-pay | Admitting: Internal Medicine

## 2014-09-08 ENCOUNTER — Telehealth: Payer: Self-pay | Admitting: Internal Medicine

## 2014-09-08 DIAGNOSIS — E785 Hyperlipidemia, unspecified: Secondary | ICD-10-CM

## 2014-09-08 NOTE — Telephone Encounter (Signed)
Patient called to schedule lab work that was recommended by Dr. Tenny Crawoss in April. Patient travels for work and is just now able to schedule appt to get this done. Dr. Tenny Crawoss had recently started patient on Lipitor. Will route to her so that she can state which labs she would like to order for patient.

## 2014-09-08 NOTE — Telephone Encounter (Signed)
New Message  Pt called. Request to have blood work. Please put in orders and either return note to scheduling to schedule or call the pt. Thanks!

## 2014-09-09 NOTE — Telephone Encounter (Signed)
I had recomm lipitor in fall WOuld set up for lipid panel and AST

## 2014-09-10 ENCOUNTER — Other Ambulatory Visit: Payer: Self-pay | Admitting: Internal Medicine

## 2014-09-14 NOTE — Telephone Encounter (Signed)
I left a message for the patient to call. 

## 2014-09-15 DIAGNOSIS — E785 Hyperlipidemia, unspecified: Secondary | ICD-10-CM | POA: Insufficient documentation

## 2014-09-15 NOTE — Telephone Encounter (Signed)
Follow up       Pt want heather to know that he can come in this thurs--5-26 or Friday--5-27 to have labs drawn.  Please put order in computer.  If this is too soon, please call

## 2014-09-15 NOTE — Telephone Encounter (Signed)
Notified patient that lab work has been ordered (AST, lipid profile) and he can come in either 5/26 or 5/27 to have them drawn. Patient verbalized understanding and appreciation.

## 2014-09-16 ENCOUNTER — Other Ambulatory Visit (INDEPENDENT_AMBULATORY_CARE_PROVIDER_SITE_OTHER): Payer: BLUE CROSS/BLUE SHIELD | Admitting: *Deleted

## 2014-09-16 DIAGNOSIS — E785 Hyperlipidemia, unspecified: Secondary | ICD-10-CM | POA: Diagnosis not present

## 2014-09-16 LAB — LIPID PANEL
Cholesterol: 174 mg/dL (ref 0–200)
HDL: 34.6 mg/dL — ABNORMAL LOW (ref >39.00)
NonHDL: 139.4
Total CHOL/HDL Ratio: 5
Triglycerides: 210 mg/dL — ABNORMAL HIGH (ref 0.0–149.0)
VLDL: 42 mg/dL — ABNORMAL HIGH (ref 0.0–40.0)

## 2014-09-16 LAB — LDL CHOLESTEROL, DIRECT: Direct LDL: 113 mg/dL

## 2014-09-16 LAB — AST: AST: 31 U/L (ref 0–37)

## 2014-09-29 ENCOUNTER — Ambulatory Visit: Payer: BLUE CROSS/BLUE SHIELD | Attending: Orthopedic Surgery

## 2014-09-29 DIAGNOSIS — M25673 Stiffness of unspecified ankle, not elsewhere classified: Secondary | ICD-10-CM

## 2014-09-29 DIAGNOSIS — M25572 Pain in left ankle and joints of left foot: Secondary | ICD-10-CM | POA: Diagnosis not present

## 2014-09-29 DIAGNOSIS — R29898 Other symptoms and signs involving the musculoskeletal system: Secondary | ICD-10-CM | POA: Insufficient documentation

## 2014-09-29 DIAGNOSIS — M25672 Stiffness of left ankle, not elsewhere classified: Secondary | ICD-10-CM | POA: Insufficient documentation

## 2014-09-29 NOTE — Therapy (Addendum)
[facility] [address], [address] [place], [place], [address] Phone: [phone]   Fax:  [phone]  Physical Therapy Evaluation  Patient Details  Name: Luke Wong MRN: [id number] Date of Birth: [birth year] Referring Provider:  Gildardo Cranker, MD  Encounter Date: 09/29/2014      PT End of Session - 09/29/14 0901    Visit Number 1   Date for PT Re-Evaluation 11/24/14   PT Start Time 0804   PT Stop Time 0846   PT Time Calculation (min) 42 min   Activity Tolerance Patient tolerated treatment well   Behavior During Therapy [facility] for tasks assessed/performed      Past Medical History  Diagnosis Date  . Asthma   . Hyperlipidemia     Past Surgical History  Procedure Laterality Date  . Ankle surgery Left   . Hernia repair      There were no vitals filed for this visit.  Visit Diagnosis:  Ankle stiffness, left - Plan: PT plan of care cert/re-cert  Decreased ROM of ankle - Plan: PT plan of care cert/re-cert  Pain in joint, ankle and foot, left - Plan: PT plan of care cert/re-cert      Subjective Assessment - 09/29/14 0811    Subjective Had a Lt. ankle arthoscopy and does not feel like it has helped. Has had an increase in pain in the past 2 years and feels like it has affected other joints (hip, knee) as well. Currently 3/10 now; it gets to 6/10 at the end of the day.    How long can you stand comfortably? 30 minutes    How long can you walk comfortably? 30 minutes - 1 hour    Patient Stated Goals Reduce pain, be able to get back to doing martial arts    Currently in Pain? Yes   Pain Score 3    Pain Location Ankle   Pain Orientation Left   Pain Descriptors / Indicators Aching;Dull;Sore   Pain Onset More than a month ago   Pain Frequency Intermittent   Aggravating Factors  Increased movement/walking,  going up/down stairs, having to pivot on the foot    Pain Relieving Factors pain medication, heat, epsom salt bath   Effect of Pain on Daily Activities unable to do martial arts             [facility] PT Assessment - 09/29/14 0001    Assessment   Medical Diagnosis Lt ankle arthroscopy   Onset Date/Surgical Date 06/29/14   Next MD Visit June 22nd; [facility]   Prior Therapy LBP, ankle years ago   Precautions   Precautions None   Restrictions   Weight Bearing Restrictions No   Balance Screen   Has the patient fallen in the past 6 months No   Prior Function   Level of Independence Independent   Vocation Full time employment  [address]   Leisure [employer], playing with kids    Cognition   Overall Cognitive Status Within Functional Limits for tasks assessed   Observation/Other Assessments   Focus on Therapeutic Outcomes (FOTO)  49%   Posture/Postural Control   Posture/Postural Control No significant limitations   Posture Comments ER at bilateral hip with gait   ROM / Strength   AROM / PROM / Strength AROM;PROM;Strength   AROM   Overall AROM  Deficits   Overall AROM Comments Rt. ankle within normal limits for all motions; PF normal  Cueing to keep hip in neutral for ankle ROM;  naturally ER   AROM Assessment Site Ankle   Right/Left Ankle Left   Left Ankle Dorsiflexion 10  Lacking 10 degrees   Left Ankle Inversion 20   Left Ankle Eversion 10   PROM   Overall PROM  Deficits   Overall PROM Comments Hard end feel for Lt ankle DF; unable to get more motion  R ankle PROM WNL    PROM Assessment Site Ankle   Right/Left Ankle Left   Left Ankle Dorsiflexion 10  lacking 10 with hard end feel   Left Ankle Inversion 30   Left Ankle Eversion 12   Strength   Overall Strength Deficits   Overall Strength Comments Lt DF: 4+/5; all other LE motions 5/5    Palpation   Palpation comment Increased tenderness along anterior talocrual joint; diffuse region of tenderness anteriorly; increased swelling and redness at Lt compared to Rt.    Ambulation/Gait   Ambulation/Gait Yes   Ambulation/Gait Assistance 7:  Independent   Ambulation Distance (Feet) 200 Feet   Ambulation Surface Level   Gait Comments Equal toe on both sides during ambulation; lands on lateral aspect of foot during foot flat on both feet    Balance   Balance Assessed Yes   Standardized Balance Assessment   Standardized Balance Assessment --  SLS: able to hold 5 seconds; increased sway R > L                    [facility] Adult PT Treatment/Exercise - 09/29/14 0001    Modalities   Modalities Iontophoresis   Iontophoresis   Type of Iontophoresis Dexamethasone  #1   Location Anterolateral aspect of ankle    Dose 1.0cc   Time 6 hour wear time                PT Education - 09/29/14 0838    Education provided Yes   Education Details HEP: gastroc with strap, seated figure 4, ionto   Person(s) Educated Patient   Methods Explanation;Handout   Comprehension Verbalized understanding;Returned demonstration          PT Short Term Goals - 09/29/14 0910    PT SHORT TERM GOAL #1   Title Will be independent with HEP    Time 4   Period Weeks   Status New   PT SHORT TERM GOAL #2   Title Will report decrease in pain to 4/10 in Lt. ankle at the end of the day after completing daily activities    Time 4   Period Weeks   Status New   PT SHORT TERM GOAL #3   Title Will report lying with neutral Lt. hip posture for 50% of the time to avoid hip tightness    Time 4   Period Weeks   Status New           PT Long Term Goals - 09/29/14 0913    PT LONG TERM GOAL #1   Title Be independent with advanced HEP    Time 8   Period Weeks   Status New   PT LONG TERM GOAL #2   Title Decrease FOTO score to < or =  to 37% limitation    Time 8   Period Weeks   Status New   PT LONG TERM GOAL #3   Title Improve active Lt. ankle DF movement to 0 degrees for fluid gait pattern during ambulation   Time 8   Period Weeks   Status New  Plan - 09/29/14 0901    Clinical Impression Statement 44 y.o male  presents with diffuse Lt ankle pain that has been persisting for the past 2 years. Pain is intermittent without any preciptiating factors; typically increases at the end of the day after increased activity.  Lt. ankle strength is [facility] and pt reports that he is able to stand/walk for about 30 min - 1 hour before pain onset. Decreased ROM on Lt side and pt tends to keep hip in ER for comfort. Increased sensitivity to light touch along ankle, decreased  temperature and  redness noted on Lt. side. Pt will benefit from skilled physical therapy to improve ROM and for pain management to improve ability for daily activities.     Pt will benefit from skilled therapeutic intervention in order to improve on the following deficits Impaired sensation;Pain;Decreased activity tolerance;Decreased range of motion;Decreased strength;Impaired flexibility   Rehab Potential Good   PT Frequency 2x / week   PT Duration 8 weeks   PT Treatment/Interventions Iontophoresis 4mg /ml Dexamethasone;ADLs/Self Care Home Management;Gait training;Stair training;Neuromuscular re-education;Passive range of motion;Energy conservation;Manual techniques;Functional mobility training;Moist Heat;Therapeutic activities;Ultrasound;Electrical Stimulation;Taping;Compression bandaging;Patient/family education;Therapeutic exercise   PT Next Visit Plan Begin with bike, proprioception exercise, manual therapy for DF ROM, continue ionto   Consulted and Agree with Plan of Care Patient         Problem List Patient Active Problem List   Diagnosis Date Noted  . Hyperlipidemia 09/15/2014  . Dysfunction of both Eustachian tubes 03/13/2014  . Upper airway cough syndrome 07/18/2013    Reyes IvanMarian Sabreen Kitchen, SPT 09/29/2014 10:06 AM  [facility] [address], [address] [place], [place], [id number] Phone: [phone]   Fax:  [phone]

## 2014-09-29 NOTE — Patient Instructions (Addendum)
Sitting: Unilateral   Sit, one leg straight with towel or belt around foot, other leg bent with foot tucked into inner thigh. Pull toes toward knee. Hold _20__ seconds. Repeat _3__ times per session. Do __3_ sessions per day.  Copyright  VHI. All rights reserved.  Piriformis Stretch, Sitting   Sit, one ankle on opposite knee, same-side hand on crossed knee. Push down on knee, keeping spine straight. Lean torso forward, with flat back, until tension is felt in hamstrings and gluteals of crossed-leg side. Hold 20__ seconds.  Repeat _3__ times per session. Do _3__ sessions per day.  Copyright  VHI. All rights reserved.  IONTOPHORESIS PATIENT PRECAUTIONS & CONTRAINDICATIONS:  . Redness under one or both electrodes can occur.  This characterized by a uniform redness that usually disappears within 12 hours of treatment. . Small pinhead size blisters may result in response to the drug.  Contact your physician if the problem persists more than 24 hours. . On rare occasions, iontophoresis therapy can result in temporary skin reactions such as rash, inflammation, irritation or burns.  The skin reactions may be the result of individual sensitivity to the ionic solution used, the condition of the skin at the start of treatment, reaction to the materials in the electrodes, allergies or sensitivity to dexamethasone, or a poor connection between the patch and your skin.  Discontinue using iontophoresis if you have any of these reactions and report to your therapist. . Remove the Patch or electrodes if you have any undue sensation of pain or burning during the treatment and report discomfort to your therapist. . Tell your Therapist if you have had known adverse reactions to the application of electrical current. . If using the Patch, the LED light will turn off when treatment is complete and the patch can be removed.  Approximate treatment time is 1-3 hours.  Remove the patch when light goes off or after 6  hours. . The Patch can be worn during normal activity, however excessive motion where the electrodes have been placed can cause poor contact between the skin and the electrode or uneven electrical current resulting in greater risk of skin irritation. Marland Kitchen. Keep out of the reach of children.   . DO NOT use if you have a cardiac pacemaker or any other electrically sensitive implanted device. . DO NOT use if you have a known sensitivity to dexamethasone. . DO NOT use during Magnetic Resonance Imaging (MRI). . DO NOT use over broken or compromised skin (e.g. sunburn, cuts, or acne) due to the increased risk of skin reaction. . DO NOT SHAVE over the area to be treated:  To establish good contact between the Patch and the skin, excessive hair may be clipped. . DO NOT place the Patch or electrodes on or over your eyes, directly over your heart, or brain. . DO NOT reuse the Patch or electrodes as this may cause burns to occur.

## 2014-10-01 ENCOUNTER — Ambulatory Visit: Payer: BLUE CROSS/BLUE SHIELD | Admitting: Physical Therapy

## 2014-10-01 ENCOUNTER — Encounter: Payer: Self-pay | Admitting: Physical Therapy

## 2014-10-01 DIAGNOSIS — M25672 Stiffness of left ankle, not elsewhere classified: Secondary | ICD-10-CM

## 2014-10-01 DIAGNOSIS — M25572 Pain in left ankle and joints of left foot: Secondary | ICD-10-CM

## 2014-10-01 DIAGNOSIS — M25673 Stiffness of unspecified ankle, not elsewhere classified: Secondary | ICD-10-CM

## 2014-10-01 NOTE — Therapy (Signed)
[facility] [address] [place], [place], [address] Phone: [phone]   Fax:  [phone]  Physical Therapy Treatment  Patient Details  Name: Luke Wong MRN: [id number] Date of Birth: 1970/07/19 Referring Provider:  Gildardo Cranker, MD  Encounter Date: 10/01/2014      PT End of Session - 10/01/14 0920    Visit Number 2   Date for PT Re-Evaluation 11/24/14   PT Start Time 0847   PT Stop Time 0930   PT Time Calculation (min) 43 min   Activity Tolerance Patient tolerated treatment well   Behavior During Therapy [facility] for tasks assessed/performed      Past Medical History  Diagnosis Date  . Asthma   . Hyperlipidemia     Past Surgical History  Procedure Laterality Date  . Ankle surgery Left   . Hernia repair      There were no vitals filed for this visit.  Visit Diagnosis:  Pain in joint, ankle and foot, left  Ankle stiffness, left  Decreased ROM of ankle      Subjective Assessment - 10/01/14 0850    Subjective Ankle is really bad this AM. Walked a lot yesterday.    Currently in Pain? Yes   Pain Score 4    Pain Location Ankle   Pain Orientation Left   Pain Descriptors / Indicators Aching;Dull;Constant   Aggravating Factors  Variable, mostly over doing it BUT doing nothing doesn't help.    Pain Relieving Factors Meds   Multiple Pain Sites No                         [facility] Adult PT Treatment/Exercise - 10/01/14 0001    Iontophoresis   Type of Iontophoresis Dexamethasone  #2   Dose 1ml   Time 6 hour wear time   Manual Therapy   Manual Therapy Passive ROM   Manual therapy comments Attempted light stroking for nerve/ desensitazation but could only tolerate about 3 min   Ankle Exercises: Stretches   Plantar Fascia Stretch 3 reps;20 seconds   Plantar Fascia Stretch Limitations Towel stretch, VC on relaxing   Ankle Exercises: Aerobic   Stationary Bike L1 x 6 min   Ankle Exercises:  Standing   Rebounder Wt shifting 3 ways 1 mineach   Ankle Exercises: Seated   BAPS Sitting;Level 2  2 min each direction including cirlces                  PT Short Term Goals - 09/29/14 0910    PT SHORT TERM GOAL #1   Title Will be independent with HEP    Time 4   Period Weeks   Status New   PT SHORT TERM GOAL #2   Title Will report decrease in pain to 4/10 in Lt. ankle at the end of the day after completing daily activities    Time 4   Period Weeks   Status New   PT SHORT TERM GOAL #3   Title Will report lying with neutral Lt. hip posture for 50% of the time to avoid hip tightness    Time 4   Period Weeks   Status New           PT Long Term Goals - 09/29/14 6962    PT LONG TERM GOAL #1   Title Be independent with advanced HEP    Time 8   Period Weeks   Status New  PT LONG TERM GOAL #2   Title Decrease FOTO score to < or =  to 37% limitation    Time 8   Period Weeks   Status New   PT LONG TERM GOAL #3   Title Improve active Lt. ankle DF movement to 0 degrees for fluid gait pattern during ambulation   Time 8   Period Weeks   Status New               Plan - 10/01/14 0920    Clinical Impression Statement Pt had difficulty controling movement on BAPS board, had to use his vision to see where his ankle was in space. Unsure if ionto was helpful. Doing HEP.   Pt will benefit from skilled therapeutic intervention in order to improve on the following deficits Impaired sensation;Pain;Decreased activity tolerance;Decreased range of motion;Decreased strength;Impaired flexibility   Rehab Potential Good   PT Frequency 2x / week   PT Duration 8 weeks   PT Treatment/Interventions Iontophoresis 4mg /ml Dexamethasone;ADLs/Self Care Home Management;Gait training;Stair training;Neuromuscular re-education;Passive range of motion;Energy conservation;Manual techniques;Functional mobility training;Moist Heat;Therapeutic activities;Ultrasound;Electrical  Stimulation;Taping;Compression bandaging;Patient/family education;Therapeutic exercise   PT Next Visit Plan Continue with proprioception exercises, BAPS was challenging, ionto #3   Consulted and Agree with Plan of Care Patient        Problem List Patient Active Problem List   Diagnosis Date Noted  . Hyperlipidemia 09/15/2014  . Dysfunction of both Eustachian tubes 03/13/2014  . Upper airway cough syndrome 07/18/2013    Othel Hoogendoorn, PTA 10/01/2014, 9:29 AM  [facility] [address], [address] [place], [place], [address] Phone: [phone]   Fax:  [phone]   Skin was intact pre and post ionto.

## 2014-10-05 ENCOUNTER — Ambulatory Visit: Payer: BLUE CROSS/BLUE SHIELD

## 2014-10-05 ENCOUNTER — Other Ambulatory Visit: Payer: Self-pay | Admitting: Internal Medicine

## 2014-10-05 DIAGNOSIS — M25673 Stiffness of unspecified ankle, not elsewhere classified: Secondary | ICD-10-CM

## 2014-10-05 DIAGNOSIS — M25672 Stiffness of left ankle, not elsewhere classified: Secondary | ICD-10-CM | POA: Diagnosis not present

## 2014-10-05 DIAGNOSIS — M25572 Pain in left ankle and joints of left foot: Secondary | ICD-10-CM

## 2014-10-05 NOTE — Patient Instructions (Addendum)
Desensitization Techniques  Perform these exercises 2-3x/day  for 15 minute sessions.  Progress to the next exercises when the exercises you are doing become easy.  1)  Using light pressure, rub the various textures along with the hypersensitive area:  A.  Flannel  E.  Polyester  B.  Velvet  F.  Corduroy  C.  Wool  G.  Cotton material  D.  Terry cloth  2)  With the same textures use a firmer pressure. 3)  Use a hand held vibrator and massage along the sensitive area. 4)  With a small dowel rod, eraser on a pencil or base of an ink pen tap along the sensitive area. 5)  Use an empty roll-on deodorant bottle to roll along the sensitive area. 6)  Place your hand/forearm in separate containers of the following items:  A.  Sand  D.  Dry lentil beans  B.  Dry Rice  E.  Dry kidney beans  C.  Ball bearings F.  Dry pinto beans  Scar Massage Purpose: To soften/smooth scar tissue.   To desensitize sensitive areas after surgery.   To mechanically break up inner scar tissue, adhesions, therefore allowing freer        movement of injured tendons and muscle.  Technique: Use a cream to massage with, as it insures a smooth gliding motion and avoids irritation    caused by rubbing skin to skin.  Cream is preferred over a lotion.   May begin as soon as any suture areas are healed.   Apply a firm, steady pressure with your finger-tip, pulling the skin in a circular motion over the   scarred area.  Do not rub.  Message 5 minutes, at least 2-3 times a day, unless you are getting tender afterward   IONTOPHORESIS PATIENT PRECAUTIONS & CONTRAINDICATIONS   Redness under one or both electrodes can occur.  This is characterized by a uniform redness that usually disappears within 12 hours of treatment.  Small pinhead size blisters may result in response to the drug.  Contact your physician if the problem persists more than 24 hours.  On rare occasions, iontophoresis therapy can result in temporary skin  reactions such as rash, inflammation, irritation or burns.  The skin reaction bay be the result of individual sensitivity to the ionic solution used, the condition of the skin at the start of treatment, reaction to the materials in the electrodes, allergies or sensitivity to dexamethasone, or a poor connection between the patch and your skin.  Discontinue using iontophoresis if you have any of these reactions and report to your therapist.  Remove the Patch or electrodes if you have any undue sensation of pain or burning during the treatment and report discomfort to your therapist.  Tell your Therapist if you have had known adverse reactions to the application of electrical current.  If using the Patch, the LED light will turn off when treatment is complete and the patch can be removed.  Approximate treatment time is 1-3 hours.  Remove the patch when light goes off or after 6 hours.  The Patch can be worn during normal activity, however excessive motion where the electrodes have been placed can cause poor contact between the skin and the electrode or uneven electrical current resulting in greater risk of skin irritation.  Keep out of the reach of children.   DO NOT use if you have a cardiac pacemaker or any other electrically sensitive  Implanted device.  DO NOT use if you  have known sensitivity to Dexamethasone.  DO NOT use during Magnetic Resonance Imaging (MRI).  DO NOT use over broken or compromised skin (e.g. Sunburn, cuts or acne) due to the increased risk of skin reaction.  DO NOT SHAVE over the area to be treated:  To establish good contact between the Patch and the skin excessive hair may be clipped.  DO NOT place the Patch or electrodes on or over your eyes, directly over your heart or brain.  DO NOT reuse the Patch or electrodes as this may cause burns to occur.

## 2014-10-05 NOTE — Therapy (Addendum)
[facility] [address] [place], [place], [address] Phone: [phone]   Fax:  [phone]  Physical Therapy Treatment  Patient Details  Name: Luke Wong MRN: [id number] Date of Birth: 26-Dec-1970 Referring Provider:  Gildardo Cranker, MD  Encounter Date: 10/05/2014      PT End of Session - 10/05/14 0732    Visit Number 3   Date for PT Re-Evaluation 11/24/14   PT Start Time 0731   PT Stop Time 0802   PT Time Calculation (min) 31 min   Activity Tolerance Patient limited by pain;Patient tolerated treatment well   Behavior During Therapy [facility] for tasks assessed/performed      Past Medical History  Diagnosis Date  . Asthma   . Hyperlipidemia     Past Surgical History  Procedure Laterality Date  . Ankle surgery Left   . Hernia repair      There were no vitals filed for this visit.  Visit Diagnosis:  Pain in joint, ankle and foot, left  Ankle stiffness, left  Decreased ROM of ankle      Subjective Assessment - 10/05/14 0733    Subjective Ankle is feeling sore and pain has been keeping him up the past two nights. May be due to walking around a lot of Sunday. Has been resting it the last two days. Pain radiating toward calf. Ionto patch does not seem to help/harm the area.   Pain Score 4    Pain Location Ankle   Pain Orientation Left   Pain Onset More than a month ago   Pain Frequency Intermittent   Aggravating Factors  Increased activity, does not appear to have one specific thing   Pain Relieving Factors Ibuprofen, heat for temporary relief                          [facility] Adult PT Treatment/Exercise - 10/05/14 0001    Iontophoresis   Type of Iontophoresis Dexamethasone  #3   Location Anterolateral aspect of ankle    Dose 1.0cc   Time 6 hour wear time   Manual Therapy   Manual Therapy Soft tissue mobilization   Manual therapy comments Scar mobilizatoin; light pressure; 3 minutes    Ankle Exercises: Seated   BAPS Sitting;Level 1  2 min each direction including cirlces; tacti   Ankle Exercises: Standing   SLS 3x30 seconds each leg on trampoline                PT Education - 10/05/14 0747    Education provided Yes   Education Details desensitization techniques, ionto   Person(s) Educated Patient   Methods Explanation;Handout   Comprehension Verbalized understanding;Returned demonstration          PT Short Term Goals - 10/05/14 0900    PT SHORT TERM GOAL #2   Title Will report decrease in pain to 4/10 in Lt. ankle at the end of the day after completing daily activities   Lt ankle has increased in pain to 6/10 due to increased activity this past weekend   Time 4   Period Weeks   Status On-going           PT Long Term Goals - 09/29/14 0913    PT LONG TERM GOAL #1   Title Be independent with advanced HEP    Time 8   Period Weeks   Status New   PT LONG TERM GOAL #2   Title Decrease  FOTO score to < or =  to 37% limitation    Time 8   Period Weeks   Status New   PT LONG TERM GOAL #3   Title Improve active Lt. ankle DF movement to 0 degrees for fluid gait pattern during ambulation   Time 8   Period Weeks   Status New               Plan - 10/05/14 0901    Clinical Impression Statement Able to tolerate standing and sitting ankle proprioceptive exercises with only mild discomfort. Increased tenderness/irritation response to light scar massage at ankle; no notable redness/temperature difference this session. Provided desensitization exercises for HEP in order to decrease response to non-painful stimuli.  Will continue to benefit from skilled PT for continued balance, ankle strength/ROM and pain management.    Pt will benefit from skilled therapeutic intervention in order to improve on the following deficits Impaired sensation;Pain;Decreased activity tolerance;Decreased range of motion;Decreased strength;Impaired flexibility   Rehab  Potential Good   PT Frequency 2x / week   PT Duration 8 weeks   PT Treatment/Interventions Iontophoresis /ml Dexamethasone;ADLs/Self Care Home Management;Gait training;Stair training;Neuromuscular re-education;Passive range of motion;Energy conservation;Manual techniques;Functional mobility training;Moist Heat;Therapeutic activities;Ultrasound;Electrical Stimulation;Taping;Compression bandaging;Patient/family education;Therapeutic exercise   PT Next Visit Plan Continue with proprioception exercises, assess goals , ionto #3   Consulted and Agree with Plan of Care Patient        Problem List Patient Active Problem List   Diagnosis Date Noted  . Hyperlipidemia 09/15/2014  . Dysfunction of both Eustachian tubes 03/13/2014  . Upper airway cough syndrome 07/18/2013    Reyes Ivan, SPT 10/05/2014 9:26 AM During this treatment session, the therapist was present, participating in, and directing the treatment. Lorrene Reid, PT 10/05/2014 9:27 AM  [facility] [address], [address] [place], [place], [address] Phone: [phone]   Fax:  [phone]

## 2014-10-07 ENCOUNTER — Ambulatory Visit: Payer: BLUE CROSS/BLUE SHIELD

## 2014-10-07 DIAGNOSIS — M25673 Stiffness of unspecified ankle, not elsewhere classified: Secondary | ICD-10-CM

## 2014-10-07 DIAGNOSIS — M25672 Stiffness of left ankle, not elsewhere classified: Secondary | ICD-10-CM

## 2014-10-07 DIAGNOSIS — M25572 Pain in left ankle and joints of left foot: Secondary | ICD-10-CM

## 2014-10-07 NOTE — Patient Instructions (Signed)

## 2014-10-07 NOTE — Therapy (Signed)
[facility] [address], [address] [place], [place], [address] Phone: [phone]   Fax:  [phone]  Physical Therapy Treatment  Patient Details  Name: Luke Wong MRN: [id number] Date of Birth: 20-May-1970 Referring Provider:  Gildardo Cranker, MD  Encounter Date: 10/07/2014      PT End of Session - 10/07/14 0804    Visit Number 4   Date for PT Re-Evaluation 11/24/14   PT Start Time 0730   PT Stop Time 0802   PT Time Calculation (min) 32 min   Activity Tolerance Patient tolerated treatment well   Behavior During Therapy [facility] for tasks assessed/performed      Past Medical History  Diagnosis Date  . Asthma   . Hyperlipidemia     Past Surgical History  Procedure Laterality Date  . Ankle surgery Left   . Hernia repair      There were no vitals filed for this visit.  Visit Diagnosis:  Pain in joint, ankle and foot, left  Ankle stiffness, left  Decreased ROM of ankle      Subjective Assessment - 10/07/14 0736    Subjective Ankle is feeling "crappy"; no improvement since Tuesday.    Patient Stated Goals Reduce pain, be able to get back to doing martial arts    Currently in Pain? Yes   Pain Score 4    Pain Location Ankle   Pain Orientation Left   Pain Descriptors / Indicators Aching;Dull;Constant   Pain Type Chronic pain   Pain Onset More than a month ago   Pain Frequency Intermittent   Aggravating Factors  Increased activity,    Pain Relieving Factors Ibuprofen, hot water                          [facility] Adult PT Treatment/Exercise - 10/07/14 0001    Iontophoresis   Type of Iontophoresis Dexamethasone  #4   Location Anterolateral aspect of ankle    Dose 1.0cc   Time 6 hour wear time   Ankle Exercises: Standing   SLS 3x1 min on Lt. LE; 30 sec on Rt LE    Heel Raises 20 reps  Foam Mat   Ankle Exercises: Aerobic   Stationary Bike L1 x 6 min   Ankle Exercises: Seated   BAPS Sitting;Level  1  2 min each direction including cirlces; tacti                PT Education - 10/07/14 0752    Education provided Yes   Education Details ionto   Person(s) Educated Patient   Methods Explanation;Demonstration   Comprehension Verbalized understanding;Returned demonstration          PT Short Term Goals - 10/05/14 0900    PT SHORT TERM GOAL #2   Title Will report decrease in pain to 4/10 in Lt. ankle at the end of the day after completing daily activities   Lt ankle has increased in pain to 6/10 due to increased activity this past weekend   Time 4   Period Weeks   Status On-going           PT Long Term Goals - 09/29/14 0913    PT LONG TERM GOAL #1   Title Be independent with advanced HEP    Time 8   Period Weeks   Status New   PT LONG TERM GOAL #2   Title Decrease FOTO score to < or =  to 37% limitation  Time 8   Period Weeks   Status New   PT LONG TERM GOAL #3   Title Improve active Lt. ankle DF movement to 0 degrees for fluid gait pattern during ambulation   Time 8   Period Weeks   Status New               Plan - 10/07/14 0805    Clinical Impression Statement Continues to experience diffuse pain along anterolateral ankle pain; does not feel like exercises have been exacerbating pain. Demonstrated improved proprioception using BAPS board with Lt. ankle and  increased SLS time on trampoline indicating improved balance response.  Continued ionto treatment for pain management. Will benefit from skilled PT for continued LE exericse and ankle proprioceptive training.   Pt will benefit from skilled therapeutic intervention in order to improve on the following deficits Impaired sensation;Pain;Decreased activity tolerance;Decreased range of motion;Decreased strength;Impaired flexibility   PT Frequency 2x / week   PT Duration 8 weeks   PT Treatment/Interventions Iontophoresis 4mg /ml Dexamethasone;ADLs/Self Care Home Management;Gait training;Stair  training;Neuromuscular re-education;Passive range of motion;Energy conservation;Manual techniques;Functional mobility training;Moist Heat;Therapeutic activities;Ultrasound;Electrical Stimulation;Taping;Compression bandaging;Patient/family education;Therapeutic exercise   PT Next Visit Plan Try BAPS board Level 2, DF/PF on rockerboard   Consulted and Agree with Plan of Care Patient        Problem List Patient Active Problem List   Diagnosis Date Noted  . Hyperlipidemia 09/15/2014  . Dysfunction of both Eustachian tubes 03/13/2014  . Upper airway cough syndrome 07/18/2013   Reyes Ivan, SPT 10/07/2014 8:22 AM During this treatment session, the therapist was present, participating in, and directing the treatment. TAKACS,KELLY 10/07/2014, 8:22 AM  [facility] [address] [place], [place], [address] Phone: [phone]   Fax:  [phone]

## 2014-10-12 ENCOUNTER — Ambulatory Visit: Payer: BLUE CROSS/BLUE SHIELD

## 2014-10-12 DIAGNOSIS — M25672 Stiffness of left ankle, not elsewhere classified: Secondary | ICD-10-CM | POA: Diagnosis not present

## 2014-10-12 DIAGNOSIS — M25572 Pain in left ankle and joints of left foot: Secondary | ICD-10-CM

## 2014-10-12 DIAGNOSIS — M25673 Stiffness of unspecified ankle, not elsewhere classified: Secondary | ICD-10-CM

## 2014-10-12 NOTE — Patient Instructions (Signed)

## 2014-10-12 NOTE — Therapy (Signed)
[facility] [address], [address] [place], [place], [address] Phone: [phone]   Fax:  [phone]  Physical Therapy Treatment  Patient Details  Name: Luke Wong MRN: [id number] Date of Birth: 12-Nov-1970 Referring Provider:  Nadara Mustard, MD  Encounter Date: 10/12/2014      PT End of Session - 10/12/14 0758    Visit Number 5   Date for PT Re-Evaluation 11/24/14   PT Start Time 0729   PT Stop Time 0759   PT Time Calculation (min) 30 min   Activity Tolerance Patient limited by pain  Pain with all exercise today   Behavior During Therapy [facility] for tasks assessed/performed      Past Medical History  Diagnosis Date  . Asthma   . Hyperlipidemia     Past Surgical History  Procedure Laterality Date  . Ankle surgery Left   . Hernia repair      There were no vitals filed for this visit.  Visit Diagnosis:  Pain in joint, ankle and foot, left  Ankle stiffness, left  Decreased ROM of ankle      Subjective Assessment - 10/12/14 0732    Subjective Pt is going on a hiking trip over the weekend, very nervous that he is going to have a lot of pain.     Limitations Standing;Walking   How long can you walk comfortably? 30 minutes - 1 hour    Currently in Pain? Yes   Pain Score 4    Pain Location Ankle   Pain Orientation Left   Pain Descriptors / Indicators Aching;Constant;Dull  tearing   Pain Type Chronic pain   Pain Onset More than a month ago   Pain Frequency Constant   Aggravating Factors  end of the day (pain up to 6-7/10), sleep at night, standing longer than 30 minutes   Pain Relieving Factors ibuprofen   Multiple Pain Sites No            [facility] PT Assessment - 10/12/14 0001    Assessment   Medical Diagnosis Lt ankle arthroscopy   Onset Date/Surgical Date 06/29/14   Observation/Other Assessments   Focus on Therapeutic Outcomes (FOTO)  53% limitation   AROM   Overall AROM  Deficits   AROM Assessment  Site Ankle   Right/Left Ankle Left   Left Ankle Dorsiflexion 11  lacking 11 degrees   Left Ankle Inversion 16   Left Ankle Eversion 10                     [facility] Adult PT Treatment/Exercise - 10/12/14 0001    Iontophoresis   Type of Iontophoresis Dexamethasone  #5   Location Anterolateral aspect of ankle    Dose 1.0cc   Time 6 hour wear time   Ankle Exercises: Aerobic   Stationary Bike L3 x 6 min  PT present to discuss goals with pt   Ankle Exercises: Seated   BAPS Sitting;Level 2  2 min each direction including cirlces; tactile cues by PT   Other Seated Ankle Exercises ankle AROM all directions                PT Education - 10/12/14 0741    Education provided Yes   Education Details ionto   Person(s) Educated Patient   Methods Explanation;Handout   Comprehension Verbalized understanding          PT Short Term Goals - 10/05/14 0900    PT SHORT TERM GOAL #2  Title Will report decrease in pain to 4/10 in Lt. ankle at the end of the day after completing daily activities   Lt ankle has increased in pain to 6/10 due to increased activity this past weekend   Time 4   Period Weeks   Status On-going           PT Long Term Goals - 10/12/14 0735    PT LONG TERM GOAL #1   Title Be independent with advanced HEP    Time 8   Period Weeks   Status On-going  Pt is independent in current HEP   PT LONG TERM GOAL #2   Title Decrease FOTO score to < or =  to 37% limitation    Time 8   Period Weeks               Plan - 10/12/14 2841    Clinical Impression Statement Pt has attended 5 PT sessions including 5 ionto patches, flexibility, instruction in desensitization program for home and proprioception exercises.  Pt reports no change in status and FOTO score was 53% limitation today (49% limitation at evaluation), and continues to have 4-6/10 Lt ankle pain with all activity. No change in Lt ankle AROM since evaluation. Pain is worse at night.  Pt  with improved propriocpetion with  seated BAPs board.     Pt will benefit from skilled therapeutic intervention in order to improve on the following deficits Impaired sensation;Pain;Decreased activity tolerance;Decreased range of motion;Decreased strength;Impaired flexibility   Rehab Potential Good   PT Frequency 2x / week   PT Duration 8 weeks   PT Treatment/Interventions Iontophoresis 4mg /ml Dexamethasone;ADLs/Self Care Home Management;Gait training;Stair training;Neuromuscular re-education;Passive range of motion;Energy conservation;Manual techniques;Functional mobility training;Moist Heat;Therapeutic activities;Ultrasound;Electrical Stimulation;Taping;Compression bandaging;Patient/family education;Therapeutic exercise   PT Next Visit Plan See what MD says regarding PT, 1 more ionto patch, propriocpetion, desensitization   Consulted and Agree with Plan of Care Patient        Problem List Patient Active Problem List   Diagnosis Date Noted  . Hyperlipidemia 09/15/2014  . Dysfunction of both Eustachian tubes 03/13/2014  . Upper airway cough syndrome 07/18/2013    Naseem Varden, PT 10/12/2014, 8:01 AM  [facility] [address], [address] [place], [place], [address] Phone: [phone]   Fax:  [phone]

## 2014-10-19 ENCOUNTER — Ambulatory Visit: Payer: BLUE CROSS/BLUE SHIELD

## 2014-10-19 DIAGNOSIS — M25672 Stiffness of left ankle, not elsewhere classified: Secondary | ICD-10-CM

## 2014-10-19 DIAGNOSIS — M25673 Stiffness of unspecified ankle, not elsewhere classified: Secondary | ICD-10-CM

## 2014-10-19 DIAGNOSIS — M25572 Pain in left ankle and joints of left foot: Secondary | ICD-10-CM

## 2014-10-19 NOTE — Therapy (Signed)
[facility] [address], [address] [place], [place], [address] Phone: [phone]   Fax:  [phone]  Physical Therapy Treatment  Patient Details  Name: Luke Wong MRN: [id number] Date of Birth: [birth year] Referring Provider:  Gildardo Crankeross, Charles, MD  Encounter Date: 10/19/2014      PT End of Session - 10/19/14 0815    Visit Number 6   Date for PT Re-Evaluation 11/24/14   PT Start Time 0736   PT Stop Time 0820   PT Time Calculation (min) 44 min   Activity Tolerance Patient tolerated treatment well;Patient limited by pain   Behavior [facility] Therapy [facility] for tasks assessed/performed      Past Medical History  Diagnosis Date  . Asthma   . Hyperlipidemia     Past Surgical History  Procedure Laterality Date  . Ankle surgery Left   . Hernia repair      There were no vitals filed for this visit.  Visit Diagnosis:  Pain in joint, ankle and foot, left  Ankle stiffness, left  Decreased ROM of ankle      Subjective Assessment - 10/19/14 0738    Subjective Went on a hiking trip this weekend and feels like he is really paying for it. Noticed decreased balance with hiking.    How long can you walk comfortably? 2-3 hour hike    Patient Stated Goals Reduce pain, be able to get back to doing martial arts    Currently in Pain? Yes   Pain Score 7    Pain Location Ankle   Pain Orientation Left   Pain Descriptors / Indicators Stabbing   Pain Type Chronic pain   Pain Radiating Towards Lt. shin area    Pain Onset More than a month ago   Pain Frequency Constant   Aggravating Factors  Extended amounts of hiking                          [facility] Adult PT Treatment/Exercise - 10/19/14 0001    Modalities   Modalities Electrical Stimulation   Electrical Stimulation   Electrical Stimulation Location Anterolateral Lt. ankle   Electrical Stimulation Action Pain Relief   Electrical Stimulation Parameters Pre-Mod Channel 1  - 15 minutes   Electrical Stimulation Goals Pain   Iontophoresis   Type of Iontophoresis Dexamethasone  #6   Location Anterolateral aspect of ankle    Dose 1.0cc   Time 6 hour wear time   Manual Therapy   Manual Therapy Passive ROM   Manual therapy comments Mirror Therapy - passive ROM, light stroking with different materials   Ankle Exercises: Standing   SLS 3x1 min bilaterally   rebounder   Heel Raises 20 reps  Foam Mat   Ankle Exercises: Aerobic   Stationary Bike L3 x 4 min  PT present to discuss goals with pt                PT Education - 10/19/14 1010    Education provided Yes   Education Details ionto   Person(s) Educated Patient   Methods Explanation;Handout   Comprehension Verbalized understanding          PT Short Term Goals - 10/19/14 0744    PT SHORT TERM GOAL #1   Title Will be independent with HEP   Reports that he has only done desensitization exercises 1-2 times at thome    Time 4   Period Weeks   Status On-going  PT SHORT TERM GOAL #2   Title Will report decrease in pain to 4/10 in Lt. ankle at the end of the day after completing daily activities   Reported 7/10 after going hiking this weekend with all activities    Status On-going   PT SHORT TERM GOAL #3   Title Will report lying with neutral Lt. hip posture for 50% of the time to avoid hip tightness   Continues to lie down with hip externally rotated.    Time 4   Period Days   Status On-going           PT Long Term Goals - 10/12/14 0735    PT LONG TERM GOAL #1   Title Be independent with advanced HEP    Time 8   Period Weeks   Status On-going  Pt is independent in current HEP   PT LONG TERM GOAL #2   Title Decrease FOTO score to < or =  to 37% limitation    Time 8   Period Weeks               Plan - 10/19/14 1003    Clinical Impression Statement Pt has noticed continued increase in pain after vigorous activity over the weekend and increased difficulty with  descending stairs. Able to progress to 1 minute of SLS balance and tolerated ankle strengthening exercises. Utilized mirror therapy in conjunction with manual therapy for desensitization of Lt. ankle. Pt reported e-stim helped decrease Lt. ankle symptoms. Will continue with PT for continued strength, balance and proprioceptive/desensitization training.     Pt will benefit from skilled therapeutic intervention in order to improve on the following deficits Impaired sensation;Pain;Decreased activity tolerance;Decreased range of motion;Decreased strength;Impaired flexibility   Rehab Potential Good   PT Frequency 2x / week   PT Duration 8 weeks   PT Treatment/Interventions Iontophoresis /ml Dexamethasone;ADLs/Self Care Home Management;Gait training;Stair training;Neuromuscular re-education;Passive range of motion;Energy conservation;Manual techniques;Functional mobility training;Moist Heat;Therapeutic activities;Ultrasound;Electrical Stimulation;Taping;Compression bandaging;Patient/family education;Therapeutic exercise   PT Next Visit Plan Continue with e-stim to Lt. ankle, continue with ankle strengthening/proprioceptive exercises on foam pad/trampoline/BAPS board   Consulted and Agree with Plan of Care Patient        Problem List Patient Active Problem List   Diagnosis Date Noted  . Hyperlipidemia 09/15/2014  . Dysfunction of both Eustachian tubes 03/13/2014  . Upper airway cough syndrome 07/18/2013   Reyes Ivan, SPT 10/19/2014 10:24 AM During this treatment session, the therapist was present, participating in, and directing the treatment. TAKACS,KELLY 10/19/2014, 10:24 AM  [facility] [address] [place], [place], [address] Phone: [phone]   Fax:  [phone]

## 2014-10-19 NOTE — Patient Instructions (Signed)

## 2014-10-21 ENCOUNTER — Ambulatory Visit: Payer: BLUE CROSS/BLUE SHIELD

## 2014-10-21 DIAGNOSIS — M25672 Stiffness of left ankle, not elsewhere classified: Secondary | ICD-10-CM

## 2014-10-21 DIAGNOSIS — M25572 Pain in left ankle and joints of left foot: Secondary | ICD-10-CM

## 2014-10-21 NOTE — Therapy (Addendum)
[facility] [address], [place], [place], [address] Phone: [phone]   Fax:  [phone]  Physical Therapy Treatment  Patient Details  Name: Luke Wong MRN: [id number] Date of Birth: 02/04/1971 Referring Provider:  Lona Kettle, MD  Encounter Date: 10/21/2014      PT End of Session - 10/21/14 0743    Visit Number 7   Date for PT Re-Evaluation 11/24/14   PT Start Time 0740   PT Stop Time 0821   PT Time Calculation (min) 41 min   Activity Tolerance Patient tolerated treatment well   Behavior During Therapy [facility] for tasks assessed/performed      Past Medical History  Diagnosis Date  . Asthma   . Hyperlipidemia     Past Surgical History  Procedure Laterality Date  . Ankle surgery Left   . Hernia repair      There were no vitals filed for this visit.  Visit Diagnosis:  Pain in joint, ankle and foot, left  Ankle stiffness, left      Subjective Assessment - 10/21/14 0741    Subjective Did not feel like e-stim hurt but did not notice a change in pain difference the next day. Pain has reduced since last session after rest but still present; reports feeling like there is a bowling ball on his left foot.  Still has difficulty going down the steps.    Patient Stated Goals Reduce pain, be able to get back to doing martial arts    Currently in Pain? Yes   Pain Score 3    Pain Location Ankle   Pain Orientation Left                         [facility] Adult PT Treatment/Exercise - 10/21/14 0001    Electrical Stimulation   Electrical Stimulation Location Anterolateral Lt. ankle   Electrical Stimulation Parameters Pre-Mod Channel 1-15    Electrical Stimulation Goals Pain   Ankle Exercises: Seated   Other Seated Ankle Exercises 20x red theraband ankle 4 way directions  mirror therapy   Ankle Exercises: Standing   Other Standing Ankle Exercises Tandem stance 1 min; 3x each leg    Other Standing  Ankle Exercises Lunges x 10                   PT Short Term Goals - 10/19/14 0744    PT SHORT TERM GOAL #1   Title Will be independent with HEP   Reports that he has only done desensitization exercises 1-2 times at thome    Time 4   Period Weeks   Status On-going   PT SHORT TERM GOAL #2   Title Will report decrease in pain to 4/10 in Lt. ankle at the end of the day after completing daily activities   Reported 7/10 after going hiking this weekend with all activities    Status On-going   PT SHORT TERM GOAL #3   Title Will report lying with neutral Lt. hip posture for 50% of the time to avoid hip tightness   Continues to lie down with hip externally rotated.    Time 4   Period Days   Status On-going           PT Long Term Goals - 10/12/14 0735    PT LONG TERM GOAL #1   Title Be independent with advanced HEP    Time 8   Period Weeks   Status  On-going  Pt is independent in current HEP   PT LONG TERM GOAL #2   Title Decrease FOTO score to < or =  to 37% limitation    Time 8   Period Weeks               Plan - 10/21/14 0956    Clinical Impression Statement Has had decreased irritability at Lt ankle and pain has localized to small area under distal lateral/medial mallelous; reports that he does not feel diffuse pain at anterior talus. Continues to report feeling generalized pain associated with Lt. ankle. Will continue desensitiztion exercises at home. Will continue to benefit from skilled PT for ankle strengtheing, proprioception and  desensitization training.     Pt will benefit from skilled therapeutic intervention in order to improve on the following deficits Impaired sensation;Pain;Decreased activity tolerance;Decreased range of motion;Decreased strength;Impaired flexibility   Rehab Potential Good   PT Frequency 2x / week   PT Duration 8 weeks   PT Treatment/Interventions Iontophoresis 97m/ml Dexamethasone;ADLs/Self Care Home Management;Gait training;Stair  training;Neuromuscular re-education;Passive range of motion;Energy conservation;Manual techniques;Functional mobility training;Moist Heat;Therapeutic activities;Ultrasound;Electrical Stimulation;Taping;Compression bandaging;Patient/family education;Therapeutic exercise   PT Next Visit Plan Continue with e-stim, perform BAPS board exercises for propioceptive training    Consulted and Agree with Plan of Care Patient        Problem List Patient Active Problem List   Diagnosis Date Noted  . Hyperlipidemia 09/15/2014  . Dysfunction of both Eustachian tubes 03/13/2014  . Upper airway cough syndrome 07/18/2013   MReginal Lutes SPT 10/21/2014 10:21 AM During this treatment session, the therapist was present, participating in, and directing the treatment. TAKACS,KELLY, PT 10/21/2014, 10:21 AM PHYSICAL THERAPY DISCHARGE SUMMARY  Visits from Start of Care: 7  Current functional level related to goals / functional outcomes: See above for current status.  Pt with continued Lt ankle pain that limits mobility.      Remaining deficits: See above.  Pt with continued chronic pain in the Lt ankle.  Pt plans to follow-up with MD to discuss limited progress.     Education / Equipment: HEP, BEconomisteducation Plan: Patient agrees to discharge.  Patient goals were partially met. Patient is being discharged due to lack of progress.  ?????   KSigurd Sos PT 11/15/2014 11:41 AM  [facility] [address] [place] [address]Phone: [phone]  Fax:  [phone]

## 2014-11-20 ENCOUNTER — Emergency Department (HOSPITAL_COMMUNITY)
Admission: EM | Admit: 2014-11-20 | Discharge: 2014-11-20 | Disposition: A | Discharge status: Home / Self Care | Payer: BLUE CROSS/BLUE SHIELD | Attending: Emergency Medicine | Admitting: Emergency Medicine

## 2014-11-20 ENCOUNTER — Encounter (HOSPITAL_COMMUNITY): Payer: Self-pay | Admitting: Emergency Medicine

## 2014-11-20 DIAGNOSIS — M545 Low back pain, unspecified: Secondary | ICD-10-CM

## 2014-11-20 DIAGNOSIS — M25551 Pain in right hip: Secondary | ICD-10-CM | POA: Insufficient documentation

## 2014-11-20 DIAGNOSIS — Z7982 Long term (current) use of aspirin: Secondary | ICD-10-CM | POA: Diagnosis not present

## 2014-11-20 DIAGNOSIS — E785 Hyperlipidemia, unspecified: Secondary | ICD-10-CM | POA: Diagnosis not present

## 2014-11-20 DIAGNOSIS — Z79899 Other long term (current) drug therapy: Secondary | ICD-10-CM | POA: Diagnosis not present

## 2014-11-20 DIAGNOSIS — J45909 Unspecified asthma, uncomplicated: Secondary | ICD-10-CM | POA: Insufficient documentation

## 2014-11-20 MED ORDER — SODIUM CHLORIDE 0.9 % IV BOLUS (SEPSIS)
1000.0000 mL | Freq: Once | INTRAVENOUS | Status: AC
  Administered 2014-11-20: 1000 mL via INTRAVENOUS

## 2014-11-20 MED ORDER — HYDROMORPHONE HCL 1 MG/ML IJ SOLN
1.0000 mg | Freq: Once | INTRAMUSCULAR | Status: AC
  Administered 2014-11-20: 1 mg via INTRAVENOUS
  Filled 2014-11-20: qty 1

## 2014-11-20 MED ORDER — NAPROXEN 500 MG PO TABS: 500.0000 mg | ORAL_TABLET | Freq: Two times a day (BID) | ORAL | Status: DC

## 2014-11-20 MED ORDER — OXYCODONE-ACETAMINOPHEN 5-325 MG PO TABS: 1.0000 | ORAL_TABLET | Freq: Four times a day (QID) | ORAL | Status: DC | PRN

## 2014-11-20 MED ORDER — ONDANSETRON HCL 4 MG/2ML IJ SOLN
4.0000 mg | Freq: Once | INTRAMUSCULAR | Status: AC
  Administered 2014-11-20: 4 mg via INTRAVENOUS
  Filled 2014-11-20: qty 2

## 2014-11-20 NOTE — ED Provider Notes (Signed)
CSN: [id number]     Arrival date & time 11/20/14  0906 History   First MD Initiated Contact with Patient 11/20/14 [phone]     Chief Complaint  Patient presents with  . Back Pain     (Consider location/radiation/quality/duration/timing/severity/associated sxs/prior Treatment) HPI Mr. Luke Wong is a 44 y.o. male presenting to the emergency department with right-sided low back and hip pain with radiation down the front of his leg just past his knee.  States the pain began on Sunday and was of sudden onset while making a twisting movement.  Denies any known injury or trauma to the area.  States went to his PCP on Tuesday and given prednisone dose pack, flexeril and vicodin.  Also states went to his chiropractor Wedneday through Friday of this week for treatment as well.  Reports using ice at home in addition to all other treatment modalities and states nothing has really helped.  Reports standing makes the pain worse while lying down seems to make the pain better.  Denies any overt loss of strength or coordination, no bladder or bowel loss, no saddle anesthesia.  States that he was supposed to have an order placed by his PCP for an MRI but there was no record of order being placed.  Pt is afebrile, no chest pain, no shortness of breath, no urinary complaints, no CVA tenderness, no swelling in his extremities.  Past Medical History  Diagnosis Date  . Asthma   . Hyperlipidemia    Past Surgical History  Procedure Laterality Date  . Ankle surgery Left   . Hernia repair     Family History  Problem Relation Age of Onset  . Heart disease Father   . Heart attack Father   . Heart attack Paternal Grandfather   . Thyroid cancer Mother    History  Substance Use Topics  . Smoking status: Never Smoker   . Smokeless tobacco: Never Used  . Alcohol Use: Yes     Comment: once per wk    Review of Systems  Constitutional: Negative for fever and chills.  Respiratory: Negative for cough and shortness of  breath.   Cardiovascular: Negative for chest pain and leg swelling.  Gastrointestinal: Negative for nausea and vomiting.  Genitourinary: Negative for dysuria and frequency.  Musculoskeletal: Positive for back pain.       Right sided hip pain with radiation below knee and right side back pain.  Neurological: Negative for dizziness and syncope.  All other systems reviewed and are negative.     Allergies  Demerol  Home Medications   Prior to Admission medications   Medication Sig Start Date End Date Taking? Authorizing Provider  aspirin 81 MG tablet Take 81 mg by mouth daily.   Yes Historical Provider, MD  atorvastatin (LIPITOR) 40 MG tablet Take 1 tablet (40 mg total) by mouth daily. 10/05/14  Yes Pricilla Riffle, MD  cyclobenzaprine (FLEXERIL) 10 MG tablet Take 10 mg by mouth 3 (three) times daily. 11/16/14  Yes Historical Provider, MD  DULoxetine (CYMBALTA) 60 MG capsule Take 60 mg by mouth daily.  06/26/13  Yes Historical Provider, MD  fenofibrate (TRICOR) 145 MG tablet Take 1 tablet by mouth everyday Patient taking differently: Take 145 mg by mouth daily. Take 1 tablet by mouth everyday 05/07/14  Yes Pricilla Riffle, MD  finasteride (PROPECIA) 1 MG tablet Take 1 mg by mouth daily.   Yes Historical Provider, MD  loratadine (CLARITIN) 10 MG tablet Take 10 mg by mouth daily.  Yes Historical Provider, MD  Multiple Vitamins-Minerals (MULTIVITAMIN PO) Take 1 tablet by mouth daily.   Yes Historical Provider, MD  albuterol (PROAIR HFA) 108 (90 BASE) MCG/ACT inhaler Inhale 2 puffs into the lungs every 6 (six) hours as needed for wheezing or shortness of breath.    Historical Provider, MD  famotidine (PEPCID) 20 MG tablet Take 20 mg by mouth daily as needed. One at bedtime as needed 07/17/13   Nyoka Cowden, MD  naproxen (NAPROSYN) 500 MG tablet Take 1 tablet (500 mg total) by mouth 2 (two) times daily with a meal. 11/20/14   Gwynn Burly, DO  oxyCODONE-acetaminophen (ROXICET) 5-325 MG per tablet Take  1 tablet by mouth every 6 (six) hours as needed for severe pain. 11/20/14   Gwynn Burly, DO  pantoprazole (PROTONIX) 40 MG tablet Take 1 tablet (40 mg total) by mouth daily as needed. Patient not taking: Reported on 09/29/2014 04/19/14   Nyoka Cowden, MD   BP 130/70 mmHg  Pulse 94  Temp(Src) 98 F (36.7 C) (Oral)  Resp 17  Ht 6' (1.829 m)  Wt 235 lb (106.595 kg)  BMI 31.86 kg/m2  SpO2 97% Physical Exam  Constitutional: He appears well-developed and well-nourished.  HENT:  Head: Normocephalic and atraumatic.  Eyes: EOM are normal.  Cardiovascular: Normal rate and regular rhythm.   Pulmonary/Chest: Effort normal and breath sounds normal.  Abdominal: Soft. There is no tenderness.  Musculoskeletal: He exhibits no edema.  ROM normal on left side. Normal strength on left side.  Slightly decreased ROM and strength on right with straight leg raise.  Strength equal bilaterally with plantarflexion and dorsiflexion.  Neurological: He is alert. No sensory deficit.    ED Course  Procedures (including critical care time) Labs Review Labs Reviewed - No data to display  Imaging Review No results found.   EKG Interpretation None      MDM   Final diagnoses:  Low back pain, non-specific   44 yo male with low back pain with onset of Sunday night.  Pain radiates to front of hip and front of leg down past knee.  Has been seen by PCP and treated with Vicodin, flexeril, and prednisone dose pack.  States supposed to have MRI as outpatient but has not been scheduled for that yet.  Patient is without neurological deficits, no bladder or bowel incontinence, no saddle anesthesia.  Strength is fair.  Pt is afebrile, normotensive, not tachycardic.  We have given pain control and IV fluids.  Will plan for discharge to home with follow up with PCP.  We have attempted to order MRI for patient as outpatient and instructed to call [facility] on Monday morning.  Given rx for Naproxen 500mg  BID and  Percocet 5/325 #15.    Gwynn Burly, DO 11/20/14 1312  Jerelyn Scott, MD 11/22/14 [phone]

## 2014-11-20 NOTE — ED Notes (Signed)
MD at bedside. 

## 2014-11-20 NOTE — ED Notes (Signed)
Pt. Stated, i hurt my back on Sunday went to doctor on Tuesday gave Prednisone dose- pak , Flexeril, and Hydrocodone.  Went to chiropractor on Wednesday he told me to come here.

## 2014-11-20 NOTE — Discharge Instructions (Signed)
Back Injury Prevention Back injuries can be extremely painful and difficult to heal. After having one back injury, you are much more likely to experience another later on. It is important to learn how to avoid injuring or re-injuring your back. The following tips can help you to prevent a back injury. PHYSICAL FITNESS  Exercise regularly and try to develop good tone in your abdominal muscles. Your abdominal muscles provide a lot of the support needed by your back.  Do aerobic exercises (walking, jogging, biking, swimming) regularly.  Do exercises that increase balance and strength (tai chi, yoga) regularly. This can decrease your risk of falling and injuring your back.  Stretch before and after exercising.  Maintain a healthy weight. The more you weigh, the more stress is placed on your back. For every pound of weight, 10 times that amount of pressure is placed on the back. DIET  Talk to your caregiver about how much calcium and vitamin D you need per day. These nutrients help to prevent weakening of the bones (osteoporosis). Osteoporosis can cause broken (fractured) bones that lead to back pain.  Include good sources of calcium in your diet, such as dairy products, green, leafy vegetables, and products with calcium added (fortified).  Include good sources of vitamin D in your diet, such as milk and foods that are fortified with vitamin D.  Consider taking a nutritional supplement or a multivitamin if needed.  Stop smoking if you smoke. POSTURE  Sit and stand up straight. Avoid leaning forward when you sit or hunching over when you stand.  Choose chairs with good low back (lumbar) support.  If you work at a desk, sit close to your work so you do not need to lean over. Keep your chin tucked in. Keep your neck drawn back and elbows bent at a right angle. Your arms should look like the letter "L."  Sit high and close to the steering wheel when you drive. Add a lumbar support to your car  seat if needed.  Avoid sitting or standing in one position for too long. Take breaks to get up, stretch, and walk around at least once every hour. Take breaks if you are driving for long periods of time.  Sleep on your side with your knees slightly bent, or sleep on your back with a pillow under your knees. Do not sleep on your stomach. LIFTING, TWISTING, AND REACHING  Avoid heavy lifting, especially repetitive lifting. If you must do heavy lifting:  Stretch before lifting.  Work slowly.  Rest between lifts.  Use carts and dollies to move objects when possible.  Make several small trips instead of carrying 1 heavy load.  Ask for help when you need it.  Ask for help when moving big, awkward objects.  Follow these steps when lifting:  Stand with your feet shoulder-width apart.  Get as close to the object as you can. Do not try to pick up heavy objects that are far from your body.  Use handles or lifting straps if they are available.  Bend at your knees. Squat down, but keep your heels off the floor.  Keep your shoulders pulled back, your chin tucked in, and your back straight.  Lift the object slowly, tightening the muscles in your legs, abdomen, and buttocks. Keep the object as close to the center of your body as possible.  When you put a load down, use these same guidelines in reverse.  Do not:  Lift the object above your waist.  Twist at the waist while lifting or carrying a load. Move your feet if you need to turn, not your waist.  Bend over without bending at your knees.  Avoid reaching over your head, across a table, or for an object on a high surface. OTHER TIPS  Avoid wet floors and keep sidewalks clear of ice to prevent falls.  Do not sleep on a mattress that is too soft or too hard.  Keep items that are used frequently within easy reach.  Put heavier objects on shelves at waist level and lighter objects on lower or higher shelves.  Find ways to  decrease your stress, such as exercise, massage, or relaxation techniques. Stress can build up in your muscles. Tense muscles are more vulnerable to injury.  Seek treatment for depression or anxiety if needed. These conditions can increase your risk of developing back pain. SEEK MEDICAL CARE IF:  You injure your back.  You have questions about diet, exercise, or other ways to prevent back injuries. MAKE SURE YOU:  Understand these instructions.  Will watch your condition.  Will get help right away if you are not doing well or get worse. Document Released: 05/17/2004 Document Revised: 07/02/2011 Document Reviewed: 05/21/2011 ExitCare Patient Information 2015 [employer]. This information is not intended to replace advice given to you by your health care provider. Make sure you discuss any questions you have with your health care provider.  

## 2014-11-22 ENCOUNTER — Ambulatory Visit
Admission: RE | Admit: 2014-11-22 | Discharge: 2014-11-22 | Disposition: A | Discharge status: Home / Self Care | Payer: BLUE CROSS/BLUE SHIELD | Source: Ambulatory Visit | Attending: Emergency Medicine | Admitting: Emergency Medicine

## 2014-11-29 ENCOUNTER — Encounter (HOSPITAL_COMMUNITY): Payer: Self-pay | Admitting: *Deleted

## 2014-11-29 ENCOUNTER — Other Ambulatory Visit: Payer: Self-pay | Admitting: Neurological Surgery

## 2014-11-30 ENCOUNTER — Encounter (HOSPITAL_COMMUNITY)
Admission: RE | Disposition: A | Discharge status: Home / Self Care | Payer: Self-pay | Source: Ambulatory Visit | Attending: Neurological Surgery

## 2014-11-30 ENCOUNTER — Encounter (HOSPITAL_COMMUNITY): Payer: Self-pay | Admitting: General Practice

## 2014-11-30 ENCOUNTER — Ambulatory Visit (HOSPITAL_COMMUNITY)
Admission: RE | Admit: 2014-11-30 | Discharge: 2014-11-30 | Disposition: A | Discharge status: Home / Self Care | Payer: BLUE CROSS/BLUE SHIELD | Source: Ambulatory Visit | Attending: Neurological Surgery | Admitting: Neurological Surgery

## 2014-11-30 ENCOUNTER — Ambulatory Visit (HOSPITAL_COMMUNITY): Payer: BLUE CROSS/BLUE SHIELD

## 2014-11-30 ENCOUNTER — Ambulatory Visit (HOSPITAL_COMMUNITY): Payer: BLUE CROSS/BLUE SHIELD | Admitting: Anesthesiology

## 2014-11-30 ENCOUNTER — Ambulatory Visit: Payer: BLUE CROSS/BLUE SHIELD | Admitting: Sports Medicine

## 2014-11-30 DIAGNOSIS — M5116 Intervertebral disc disorders with radiculopathy, lumbar region: Secondary | ICD-10-CM | POA: Insufficient documentation

## 2014-11-30 DIAGNOSIS — E785 Hyperlipidemia, unspecified: Secondary | ICD-10-CM | POA: Diagnosis not present

## 2014-11-30 DIAGNOSIS — M549 Dorsalgia, unspecified: Secondary | ICD-10-CM

## 2014-11-30 DIAGNOSIS — M5416 Radiculopathy, lumbar region: Secondary | ICD-10-CM | POA: Diagnosis present

## 2014-11-30 HISTORY — DX: Gastro-esophageal reflux disease without esophagitis: K21.9

## 2014-11-30 HISTORY — DX: Depression, unspecified: F32.A

## 2014-11-30 HISTORY — DX: Major depressive disorder, single episode, unspecified: F32.9

## 2014-11-30 HISTORY — PX: LUMBAR LAMINECTOMY/ DECOMPRESSION WITH MET-RX: SHX5959

## 2014-11-30 HISTORY — DX: Pneumonia, unspecified organism: J18.9

## 2014-11-30 LAB — CBC
HCT: 42 % (ref 39.0–52.0)
Hemoglobin: 13.8 g/dL (ref 13.0–17.0)
MCH: 28.3 pg (ref 26.0–34.0)
MCHC: 32.9 g/dL (ref 30.0–36.0)
MCV: 86.1 fL (ref 78.0–100.0)
Platelets: 301 10*3/uL (ref 150–400)
RBC: 4.88 MIL/uL (ref 4.22–5.81)
RDW: 13.4 % (ref 11.5–15.5)
WBC: 7.2 10*3/uL (ref 4.0–10.5)

## 2014-11-30 LAB — SURGICAL PCR SCREEN
MRSA, PCR: NEGATIVE
Staphylococcus aureus: POSITIVE — AB

## 2014-11-30 SURGERY — LUMBAR LAMINECTOMY/ DECOMPRESSION WITH MET-RX
Anesthesia: General | Site: Back | Laterality: Right

## 2014-11-30 MED ORDER — PROPOFOL 10 MG/ML IV BOLUS
INTRAVENOUS | Status: DC | PRN
  Administered 2014-11-30: 120 mg via INTRAVENOUS

## 2014-11-30 MED ORDER — MENTHOL 3 MG MT LOZG: 1.0000 | LOZENGE | OROMUCOSAL | Status: DC | PRN

## 2014-11-30 MED ORDER — MUPIROCIN 2 % EX OINT
1.0000 "application " | TOPICAL_OINTMENT | Freq: Once | CUTANEOUS | Status: AC
  Administered 2014-11-30: 1 via TOPICAL

## 2014-11-30 MED ORDER — DULOXETINE HCL 60 MG PO CPEP: 60.0000 mg | ORAL_CAPSULE | Freq: Every day | ORAL | Status: DC

## 2014-11-30 MED ORDER — MUPIROCIN 2 % EX OINT
TOPICAL_OINTMENT | CUTANEOUS | Status: AC
  Filled 2014-11-30: qty 22

## 2014-11-30 MED ORDER — PHENYLEPHRINE HCL 10 MG/ML IJ SOLN
INTRAMUSCULAR | Status: DC | PRN
  Administered 2014-11-30 (×3): 80 ug via INTRAVENOUS
  Administered 2014-11-30: 120 ug via INTRAVENOUS
  Administered 2014-11-30: 80 ug via INTRAVENOUS
  Administered 2014-11-30: 120 ug via INTRAVENOUS

## 2014-11-30 MED ORDER — LACTATED RINGERS IV SOLN
INTRAVENOUS | Status: DC
  Administered 2014-11-30 (×2): via INTRAVENOUS

## 2014-11-30 MED ORDER — DEXAMETHASONE SODIUM PHOSPHATE 10 MG/ML IJ SOLN
INTRAMUSCULAR | Status: DC | PRN
  Administered 2014-11-30: 8 mg via INTRAVENOUS

## 2014-11-30 MED ORDER — METHYLPREDNISOLONE ACETATE 80 MG/ML IJ SUSP
INTRAMUSCULAR | Status: DC | PRN
  Administered 2014-11-30: 80 mg

## 2014-11-30 MED ORDER — MIDAZOLAM HCL 5 MG/5ML IJ SOLN
INTRAMUSCULAR | Status: DC | PRN
  Administered 2014-11-30: 2 mg via INTRAVENOUS

## 2014-11-30 MED ORDER — SODIUM CHLORIDE 0.9 % IJ SOLN: 3.0000 mL | Freq: Two times a day (BID) | INTRAMUSCULAR | Status: DC

## 2014-11-30 MED ORDER — THROMBIN 5000 UNITS EX SOLR
CUTANEOUS | Status: DC | PRN
  Administered 2014-11-30 (×2): 5000 [IU] via TOPICAL

## 2014-11-30 MED ORDER — SUGAMMADEX SODIUM 200 MG/2ML IV SOLN
INTRAVENOUS | Status: AC
  Filled 2014-11-30: qty 2

## 2014-11-30 MED ORDER — ALBUTEROL SULFATE (2.5 MG/3ML) 0.083% IN NEBU: 3.0000 mL | INHALATION_SOLUTION | Freq: Four times a day (QID) | RESPIRATORY_TRACT | Status: DC | PRN

## 2014-11-30 MED ORDER — METHOCARBAMOL 500 MG PO TABS
750.0000 mg | ORAL_TABLET | Freq: Four times a day (QID) | ORAL | Status: DC | PRN
  Administered 2014-11-30: 750 mg via ORAL
  Filled 2014-11-30: qty 2

## 2014-11-30 MED ORDER — SUGAMMADEX SODIUM 200 MG/2ML IV SOLN
INTRAVENOUS | Status: DC | PRN
  Administered 2014-11-30: 200 mg via INTRAVENOUS

## 2014-11-30 MED ORDER — ACETAMINOPHEN 650 MG RE SUPP: 650.0000 mg | RECTAL | Status: DC | PRN

## 2014-11-30 MED ORDER — HYDROMORPHONE HCL 1 MG/ML IJ SOLN
0.5000 mg | INTRAMUSCULAR | Status: DC | PRN
  Administered 2014-11-30: 1 mg via INTRAVENOUS
  Filled 2014-11-30: qty 1

## 2014-11-30 MED ORDER — FENTANYL CITRATE (PF) 250 MCG/5ML IJ SOLN
INTRAMUSCULAR | Status: AC
  Filled 2014-11-30: qty 5

## 2014-11-30 MED ORDER — OXYCODONE-ACETAMINOPHEN 5-325 MG PO TABS: 1.0000 | ORAL_TABLET | ORAL | Status: DC | PRN

## 2014-11-30 MED ORDER — MIDAZOLAM HCL 2 MG/2ML IJ SOLN
INTRAMUSCULAR | Status: AC
  Filled 2014-11-30: qty 2

## 2014-11-30 MED ORDER — DEXTROSE 5 % IV SOLN
500.0000 mg | Freq: Four times a day (QID) | INTRAVENOUS | Status: DC | PRN
  Filled 2014-11-30: qty 5

## 2014-11-30 MED ORDER — THROMBIN 5000 UNITS EX SOLR
OROMUCOSAL | Status: DC | PRN
  Administered 2014-11-30: 10 mL via TOPICAL

## 2014-11-30 MED ORDER — ROCURONIUM BROMIDE 100 MG/10ML IV SOLN
INTRAVENOUS | Status: DC | PRN
  Administered 2014-11-30: 50 mg via INTRAVENOUS

## 2014-11-30 MED ORDER — LIDOCAINE HCL (CARDIAC) 20 MG/ML IV SOLN
INTRAVENOUS | Status: DC | PRN
  Administered 2014-11-30: 100 mg via INTRAVENOUS

## 2014-11-30 MED ORDER — SODIUM CHLORIDE 0.9 % IV SOLN: 250.0000 mL | INTRAVENOUS | Status: DC

## 2014-11-30 MED ORDER — HYDROMORPHONE HCL 1 MG/ML IJ SOLN: 0.2500 mg | INTRAMUSCULAR | Status: DC | PRN

## 2014-11-30 MED ORDER — PANTOPRAZOLE SODIUM 40 MG IV SOLR: 40.0000 mg | Freq: Every day | INTRAVENOUS | Status: DC

## 2014-11-30 MED ORDER — VECURONIUM BROMIDE 10 MG IV SOLR
INTRAVENOUS | Status: DC | PRN
  Administered 2014-11-30: 3000 ug via INTRAVENOUS
  Administered 2014-11-30: 2000 ug via INTRAVENOUS

## 2014-11-30 MED ORDER — LORATADINE 10 MG PO TABS: 10.0000 mg | ORAL_TABLET | Freq: Every day | ORAL | Status: DC

## 2014-11-30 MED ORDER — CEFAZOLIN SODIUM-DEXTROSE 2-3 GM-% IV SOLR
2.0000 g | Freq: Three times a day (TID) | INTRAVENOUS | Status: DC
  Filled 2014-11-30 (×2): qty 50

## 2014-11-30 MED ORDER — 0.9 % SODIUM CHLORIDE (POUR BTL) OPTIME
TOPICAL | Status: DC | PRN
  Administered 2014-11-30: 1000 mL

## 2014-11-30 MED ORDER — FENTANYL CITRATE (PF) 250 MCG/5ML IJ SOLN
INTRAMUSCULAR | Status: DC | PRN
  Administered 2014-11-30 (×3): 50 ug via INTRAVENOUS
  Administered 2014-11-30: 100 ug via INTRAVENOUS
  Administered 2014-11-30: 150 ug via INTRAVENOUS
  Administered 2014-11-30 (×2): 50 ug via INTRAVENOUS

## 2014-11-30 MED ORDER — SODIUM CHLORIDE 0.9 % IJ SOLN: 3.0000 mL | INTRAMUSCULAR | Status: DC | PRN

## 2014-11-30 MED ORDER — ONDANSETRON HCL 4 MG/2ML IJ SOLN: 4.0000 mg | Freq: Once | INTRAMUSCULAR | Status: DC | PRN

## 2014-11-30 MED ORDER — OXYCODONE-ACETAMINOPHEN 5-325 MG PO TABS
1.0000 | ORAL_TABLET | ORAL | Status: DC | PRN
  Administered 2014-11-30: 2 via ORAL
  Filled 2014-11-30: qty 2

## 2014-11-30 MED ORDER — HEMOSTATIC AGENTS (NO CHARGE) OPTIME
TOPICAL | Status: DC | PRN
  Administered 2014-11-30: 1 via TOPICAL

## 2014-11-30 MED ORDER — SODIUM CHLORIDE 0.9 % IR SOLN
Status: DC | PRN
  Administered 2014-11-30: 500 mL

## 2014-11-30 MED ORDER — KETOROLAC TROMETHAMINE 30 MG/ML IJ SOLN: 30.0000 mg | Freq: Four times a day (QID) | INTRAMUSCULAR | Status: DC

## 2014-11-30 MED ORDER — SODIUM CHLORIDE 0.9 % IV SOLN: INTRAVENOUS | Status: DC

## 2014-11-30 MED ORDER — ONDANSETRON HCL 4 MG/2ML IJ SOLN: 4.0000 mg | INTRAMUSCULAR | Status: DC | PRN

## 2014-11-30 MED ORDER — LIDOCAINE-EPINEPHRINE 1 %-1:100000 IJ SOLN
INTRAMUSCULAR | Status: DC | PRN
  Administered 2014-11-30: 5 mL

## 2014-11-30 MED ORDER — ONDANSETRON HCL 4 MG/2ML IJ SOLN
INTRAMUSCULAR | Status: DC | PRN
  Administered 2014-11-30: 4 mg via INTRAVENOUS

## 2014-11-30 MED ORDER — ACETAMINOPHEN 325 MG PO TABS: 650.0000 mg | ORAL_TABLET | ORAL | Status: DC | PRN

## 2014-11-30 MED ORDER — PHENOL 1.4 % MT LIQD: 1.0000 | OROMUCOSAL | Status: DC | PRN

## 2014-11-30 MED ORDER — CEFAZOLIN SODIUM-DEXTROSE 2-3 GM-% IV SOLR
2.0000 g | Freq: Once | INTRAVENOUS | Status: AC
  Administered 2014-11-30: 2 g via INTRAVENOUS
  Filled 2014-11-30: qty 50

## 2014-11-30 SURGICAL SUPPLY — 60 items
BLADE CLIPPER SURG (BLADE) IMPLANT
BLADE SURG 15 STRL LF DISP TIS (BLADE) ×1 IMPLANT
BLADE SURG 15 STRL SS (BLADE) ×1
BRUSH SCRUB EZ PLAIN DRY (MISCELLANEOUS) ×2 IMPLANT
BUR MATCHSTICK NEURO 3.0 LAGG (BURR) ×2 IMPLANT
CANISTER SUCT 3000ML PPV (MISCELLANEOUS) ×2 IMPLANT
DECANTER SPIKE VIAL GLASS SM (MISCELLANEOUS) ×4 IMPLANT
DERMABOND ADHESIVE PROPEN (GAUZE/BANDAGES/DRESSINGS) ×1
DERMABOND ADVANCED (GAUZE/BANDAGES/DRESSINGS) ×1
DERMABOND ADVANCED .7 DNX12 (GAUZE/BANDAGES/DRESSINGS) ×1 IMPLANT
DERMABOND ADVANCED .7 DNX6 (GAUZE/BANDAGES/DRESSINGS) ×1 IMPLANT
DRAPE C-ARM 42X72 X-RAY (DRAPES) ×4 IMPLANT
DRAPE INCISE IOBAN 66X45 STRL (DRAPES) ×2 IMPLANT
DRAPE LAPAROTOMY 100X72 PEDS (DRAPES) ×2 IMPLANT
DRAPE MICROSCOPE LEICA (MISCELLANEOUS) ×2 IMPLANT
DRAPE POUCH INSTRU U-SHP 10X18 (DRAPES) ×2 IMPLANT
DRAPE PROXIMA HALF (DRAPES) ×4 IMPLANT
DRSG OPSITE POSTOP 3X4 (GAUZE/BANDAGES/DRESSINGS) ×2 IMPLANT
DURAPREP 26ML APPLICATOR (WOUND CARE) ×2 IMPLANT
ELECT BLADE 6.5 EXT (BLADE) ×4 IMPLANT
ELECT REM PT RETURN 9FT ADLT (ELECTROSURGICAL) ×2
ELECTRODE REM PT RTRN 9FT ADLT (ELECTROSURGICAL) ×1 IMPLANT
GAUZE SPONGE 4X4 16PLY XRAY LF (GAUZE/BANDAGES/DRESSINGS) IMPLANT
GLOVE BIO SURGEON STRL SZ 6.5 (GLOVE) ×6 IMPLANT
GLOVE BIO SURGEON STRL SZ7 (GLOVE) ×2 IMPLANT
GLOVE BIOGEL PI IND STRL 7.0 (GLOVE) ×3 IMPLANT
GLOVE BIOGEL PI INDICATOR 7.0 (GLOVE) ×3
GLOVE ECLIPSE 7.0 STRL STRAW (GLOVE) ×2 IMPLANT
GLOVE ECLIPSE 8.0 STRL XLNG CF (GLOVE) ×2 IMPLANT
GLOVE EXAM NITRILE LRG STRL (GLOVE) ×2 IMPLANT
GLOVE EXAM NITRILE MD LF STRL (GLOVE) IMPLANT
GLOVE EXAM NITRILE XL STR (GLOVE) IMPLANT
GLOVE EXAM NITRILE XS STR PU (GLOVE) IMPLANT
GLOVE SS N UNI LF 7.0 STRL (GLOVE) ×4 IMPLANT
GLOVE SURG SS PI 7.5 STRL IVOR (GLOVE) ×2 IMPLANT
GOWN STRL REUS W/ TWL LRG LVL3 (GOWN DISPOSABLE) ×2 IMPLANT
GOWN STRL REUS W/ TWL XL LVL3 (GOWN DISPOSABLE) ×3 IMPLANT
GOWN STRL REUS W/TWL 2XL LVL3 (GOWN DISPOSABLE) IMPLANT
GOWN STRL REUS W/TWL LRG LVL3 (GOWN DISPOSABLE) ×2
GOWN STRL REUS W/TWL XL LVL3 (GOWN DISPOSABLE) ×3
KIT BASIN OR (CUSTOM PROCEDURE TRAY) ×2 IMPLANT
KIT ROOM TURNOVER OR (KITS) ×2 IMPLANT
NEEDLE HYPO 18GX1.5 BLUNT FILL (NEEDLE) IMPLANT
NEEDLE HYPO 25X1 1.5 SAFETY (NEEDLE) ×2 IMPLANT
NEEDLE SPNL 20GX3.5 QUINCKE YW (NEEDLE) IMPLANT
NS IRRIG 1000ML POUR BTL (IV SOLUTION) ×2 IMPLANT
PACK LAMINECTOMY NEURO (CUSTOM PROCEDURE TRAY) ×2 IMPLANT
PAD ARMBOARD 7.5X6 YLW CONV (MISCELLANEOUS) ×6 IMPLANT
RUBBERBAND STERILE (MISCELLANEOUS) ×4 IMPLANT
SPONGE SURGIFOAM ABS GEL SZ50 (HEMOSTASIS) ×2 IMPLANT
SUT VIC AB 0 CT1 18XCR BRD8 (SUTURE) ×1 IMPLANT
SUT VIC AB 0 CT1 8-18 (SUTURE) ×1
SUT VIC AB 2-0 CT1 18 (SUTURE) ×2 IMPLANT
SUT VIC AB 3-0 SH 8-18 (SUTURE) ×2 IMPLANT
SUT VICRYL 0 UR6 27IN ABS (SUTURE) ×2 IMPLANT
SYR 20ML ECCENTRIC (SYRINGE) ×2 IMPLANT
SYR 5ML LL (SYRINGE) IMPLANT
TOWEL OR 17X24 6PK STRL BLUE (TOWEL DISPOSABLE) ×2 IMPLANT
TOWEL OR 17X26 10 PK STRL BLUE (TOWEL DISPOSABLE) ×2 IMPLANT
WATER STERILE IRR 1000ML POUR (IV SOLUTION) ×2 IMPLANT

## 2014-11-30 NOTE — Transfer of Care (Signed)
Immediate Anesthesia Transfer of Care Note  Patient: Luke Wong  Procedure(s) Performed: Procedure(s) with comments: Right L4-5 Far Lateral Microdiskectomy w/ Metrx (Right) - Right L4-5 Far Lateral Microdiskectomy  Patient Location: PACU  Anesthesia Type:General  Level of Consciousness: awake, alert  and oriented  Airway & Oxygen Therapy: Patient Spontanous Breathing  Post-op Assessment: Report given to RN and Post -op Vital signs reviewed and stable  Post vital signs: Reviewed and stable  Last Vitals:  Filed Vitals:   11/30/14 0847  BP: 129/97  Pulse: 95  Temp: 36.7 C  Resp: 18    Complications: No apparent anesthesia complications

## 2014-11-30 NOTE — Anesthesia Preprocedure Evaluation (Signed)
Anesthesia Evaluation  Patient identified by MRN, date of birth, ID band Patient awake    Reviewed: Allergy & Precautions, NPO status , Patient's Chart, lab work & pertinent test results  Airway Mallampati: I  TM Distance: >3 FB Neck ROM: Full    Dental   Pulmonary    Pulmonary exam normal       Cardiovascular Normal cardiovascular exam    Neuro/Psych Depression    GI/Hepatic GERD-  Medicated and Controlled,  Endo/Other    Renal/GU      Musculoskeletal   Abdominal   Peds  Hematology   Anesthesia Other Findings   Reproductive/Obstetrics                             Anesthesia Physical Anesthesia Plan  ASA: II  Anesthesia Plan: General   Post-op Pain Management:    Induction: Intravenous  Airway Management Planned: Oral ETT  Additional Equipment:   Intra-op Plan:   Post-operative Plan: Extubation in OR  Informed Consent: I have reviewed the patients History and Physical, chart, labs and discussed the procedure including the risks, benefits and alternatives for the proposed anesthesia with the patient or authorized representative who has indicated his/her understanding and acceptance.     Plan Discussed with: CRNA and Surgeon  Anesthesia Plan Comments:         Anesthesia Quick Evaluation

## 2014-11-30 NOTE — H&P (Addendum)
  CC: Back and right leg pain  HPI: 44 year old male with 3 weeks of back and right leg pain that radiates from the back to the lateral thigh and anterior lower leg.  Also with numbness.  No bowel or bladder dysfunction.  He has not responded adequately to medical management. Allergies  Allergen Reactions  . Demerol [Meperidine]     unknown   Past Medical History  Diagnosis Date  . Asthma   . Hyperlipidemia   . GERD (gastroesophageal reflux disease)   . Pneumonia 2015  . Depression    Past Surgical History  Procedure Laterality Date  . Ankle surgery Left 06/07/13, 3/16    Arthroscopic debridement  x2  . Hernia repair Bilateral 1994   Family History  Problem Relation Age of Onset  . Heart disease Father   . Heart attack Father   . Heart attack Paternal Grandfather   . Thyroid cancer Mother    History   Social History  . Marital Status: Married    Spouse Name: N/A  . Number of Children: N/A  . Years of Education: N/A   Occupational History  . Attorney    Social History Main Topics  . Smoking status: Never Smoker   . Smokeless tobacco: Never Used  . Alcohol Use: Yes     Comment: once per wk 0- 3 wine, beer, liquor  . Drug Use: No  . Sexual Activity: Not on file   Other Topics Concern  . Not on file   Social History Narrative   Physical Exam  Constitutional: He is oriented to person, place, and time. He appears well-developed.  HENT:  Head: Normocephalic.  Cardiovascular: Normal rate and regular rhythm.   Pulmonary/Chest: Effort normal.  Neurological: He is alert and oriented to person, place, and time. He has normal strength and normal reflexes. A sensory deficit is present.  R L4 dermatome sensation diminished   A/P: 44 year old male with right L4 radiculopathy secondary to right L4-5 extraforaminal disc herniation.  Sensory changes, motor normal.  Agrees to proceed with L4-5 extraforaminal microendoscopic discectomy.  Risks and benefits discussed.

## 2014-11-30 NOTE — Anesthesia Postprocedure Evaluation (Signed)
Anesthesia Post Note  Patient: Luke Wong  Procedure(s) Performed: Procedure(s) (LRB): Right L4-5 Far Lateral Microdiskectomy w/ Metrx (Right)  Anesthesia type: general  Patient location: PACU  Post pain: Pain level controlled  Post assessment: Patient's Cardiovascular Status Stable  Last Vitals:  Filed Vitals:   11/30/14 1434  BP: 103/67  Pulse: 95  Temp: 36.7 C  Resp: 16    Post vital signs: Reviewed and stable  Level of consciousness: sedated  Complications: No apparent anesthesia complications

## 2014-11-30 NOTE — Discharge Summary (Signed)
  Date of admission: 11/30/14  Date of discharge: 11/30/14  Admission diagnosis:  1: Lumbar radiculopathy 2: Herniated nucleus pulposis, right L4-5, extraforaminal.  Discharge diagnosis: Same  Procedure Performed: L4-5 extraforaminal microendoscopic discectomy, right  Hospital course: Luke Wong was admitted to the hospital on the day of surgery and taken to the operating room for the above listed operation.  He tolerated the procedure well.  He was neurologically and medically stable upon awakening from general anesthesia.  He was transported to PACU and then the floor.  After recovery from anesthesia he remained at his neurological baseline and was discharged home in stable condition.  Discharge medications: Resume home medications Percocet 5/325 for pain  Followup: Neurosurgery clinic in 10 days.

## 2014-11-30 NOTE — Op Note (Addendum)
Date of Procedure: 11/30/14  Surgeon: Hulan Saas, MD  Assistant Surgeon: Aliene Beams, MD  Admission Diagnosis:  1. Lumbar radiculopathy 2. Herniated nucleus pulposis, L4-5, Right, extraforaminal  Discharge Diagnosis: Same  Procedure performed: 1. Right L4-5 extraforaminal microendoscopic discectomy  Anesthesia: General endotrachial  Estimated blood loss: 50 cc  Indication: Luke Wong is a 44 year old male with back and right leg pain consistent with an L4 radiculopathy. His MRI reveals an extraforaminal disc herniation at L4-5 on the right.  He failed to respond to medical management and wished to proceed with surgery.  Risks and benefits of surgery as well as the alternatives were explained to him in language that he understands.  His questions were answered.  Procedure: After smooth induction of general endotracheal anesthesia he was turned prone on to a Wilson frame.  Hair was clipped from the lumbar region and his skin was prepped with alcohol then betadine and chlorhexidine.  With the assistance of fluoroscopy a spinal needle was placed to identify the L4-5 disc space and the right L4 transverse process.  The skin was anesthetized with lidocaine 1% and epinephrine 1:100,000 and a 2 cm incision was made 4cm right of midline.  A k-wire was placed in contact with the right L4 transverse process.  Sequential dilators were advanced and an 18 mm tube retractor was positioned.  The operative microscope was brought into the field.  Using microsurgical technique the inferior aspect of the L4 transverse process was exposed and the retractor was repositioned inferiorly and medially to allow visualization of the lateral L4-5 facet.  Dissection was carried out to the posterior border of the L4-5 neural foramen.  The intertransverse fascia was bluntly dissected.  Gentle blunt dissection was carried out through the subfascial fat and the disc space was obtained.  The nerve root and its  associated structures were identified superiorly at this point.  Dissection was carried down to the intervertebral disc.  Working laterally four free fragments of disc were encountered and removed.  Removal of the last fragment was accompanied by brisk venous bleeding.  Using ball tipped probes the area around the disc herniation was probed and no additional fragments were visualized.  Meticulous hemostasis was obtained.  The wound was irrigated with bacitracin saline.  80 mg of Depo-medrol was injected into the depth of the wound.  The wound was closed in layers using absorbable sutures.  Dermabond was used to seal the skin edges.  Sponge and needle counts were counted and found to be consistent with the pre-operative count.  The patient was turned to the supine position and was extubated without incident.  He was able to dorsiflex his right foot after awakening from anesthesia.  He was transported to the PACU to recover.

## 2014-11-30 NOTE — Progress Notes (Signed)
Pt doing well. Pt and wife given D/C instructions with Rx, verbal understanding was provided. Pt's incision is clean and dry with no sign of infection. Pt's IV was removed prior to D/C. Pt D/C'd home via wheelchair @ 1735 per MD order. Pt is stable @ D/C and has no other needs at this time. Rema Fendt, RN

## 2014-11-30 NOTE — Anesthesia Procedure Notes (Signed)
Procedure Name: Intubation Performed by: Marena Chancy Preoxygenation: Pre-oxygenation with 100% oxygen Intubation Type: IV induction Ventilation: Mask ventilation without difficulty Laryngoscope Size: Miller and 3 Grade View: Grade II Tube type: Oral Tube size: 8.0 mm Number of attempts: 1 Placement Confirmation: ETT inserted through vocal cords under direct vision,  breath sounds checked- equal and bilateral and positive ETCO2 Tube secured with: Tape Dental Injury: Teeth and Oropharynx as per pre-operative assessment

## 2014-12-01 ENCOUNTER — Encounter (HOSPITAL_COMMUNITY): Payer: Self-pay | Admitting: Neurological Surgery

## 2014-12-02 ENCOUNTER — Telehealth: Payer: Self-pay | Admitting: Internal Medicine

## 2014-12-02 NOTE — Telephone Encounter (Signed)
Spoke with pt, scheduled for tomorrow afternoon.  Nothing further needed at this time.

## 2014-12-02 NOTE — Telephone Encounter (Signed)
Will need ov tomorrow can't treat this over the phone make sure bring all meds.

## 2014-12-02 NOTE — Telephone Encounter (Signed)
Spoke with pt, states he had back sx on Monday, has since began having nonprod cough, some chest tightness with exertion. Denies fever, mucus production. Pt not taking anything to help with symptoms. [facility] [address].    MW please advise on recs.  Thanks!

## 2014-12-03 ENCOUNTER — Ambulatory Visit (INDEPENDENT_AMBULATORY_CARE_PROVIDER_SITE_OTHER)
Admission: RE | Admit: 2014-12-03 | Discharge: 2014-12-03 | Disposition: A | Discharge status: Home / Self Care | Payer: BLUE CROSS/BLUE SHIELD | Source: Ambulatory Visit | Attending: Internal Medicine | Admitting: Internal Medicine

## 2014-12-03 ENCOUNTER — Other Ambulatory Visit: Payer: Self-pay | Admitting: Internal Medicine

## 2014-12-03 ENCOUNTER — Encounter: Payer: Self-pay | Admitting: Internal Medicine

## 2014-12-03 ENCOUNTER — Ambulatory Visit (INDEPENDENT_AMBULATORY_CARE_PROVIDER_SITE_OTHER): Payer: BLUE CROSS/BLUE SHIELD | Admitting: Internal Medicine

## 2014-12-03 DIAGNOSIS — R05 Cough: Secondary | ICD-10-CM

## 2014-12-03 DIAGNOSIS — R058 Other specified cough: Secondary | ICD-10-CM

## 2014-12-03 MED ORDER — PANTOPRAZOLE SODIUM 40 MG PO TBEC: DELAYED_RELEASE_TABLET | ORAL | Status: DC

## 2014-12-03 MED ORDER — FAMOTIDINE 20 MG PO TABS: ORAL_TABLET | ORAL | Status: DC

## 2014-12-03 MED ORDER — HYDROCODONE-HOMATROPINE 5-1.5 MG/5ML PO SYRP
5.0000 mL | ORAL_SOLUTION | Freq: Four times a day (QID) | ORAL | Status: DC | PRN
Start: 2014-12-03 — End: 2015-03-01

## 2014-12-03 NOTE — Patient Instructions (Addendum)
Restart  Pantoprazole (protonix) 40 mg   Take 30-60 min before first meal of the day and Pepcid 20 mg one bedtime until  Cough is gone for a week without the need for cough suppression  Prednisone taper over next week.   GERD (REFLUX)  is an extremely common cause of respiratory symptoms just like yours , many times with no obvious heartburn at all.    It can be treated with medication, but also with lifestyle changes including elevation of the head of your bed (ideally with 6 inch  bed blocks),  Smoking cessation, avoidance of late meals, excessive alcohol, and avoid fatty foods, chocolate, peppermint, colas, red wine, and acidic juices such as orange juice.  NO MINT OR MENTHOL PRODUCTS SO NO COUGH DROPS  USE SUGARLESS CANDY INSTEAD (Jolley ranchers or Stover's or Life Savers) or even ice chips will also do - the key is to swallow to prevent all throat clearing. NO OIL BASED VITAMINS - use powdered substitutes.   Hycodan 1 tsp every 4 hours as needed for cough , may make you sleepy.   Please remember to go to the   x-ray department downstairs for your tests - we will call you with the results when they are available.     Return if not better w/in 2 weeks

## 2014-12-03 NOTE — Progress Notes (Signed)
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  Prev allergy eval in [place] Pos cats> dogs (n/a for him) referred 07/17/2013 by Dr Duane Lope for cough eval.   History of Present Illness  07/17/2013 1st [facility] office visit/ Ayesha Markwell  Chief Complaint  Patient presents with  . Pulmonary Consult    Referred per Dr. Sigmund Hazel. Pt c/o persistant cough for the past 3 years, but worse for the past year. He also c/o DOE with minimal exertion.    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   qvar not helping - already rx with zpak and prednisone.  Only thing that really knocks it out is tussionex.  Sob mostly just with coughing. Cough tends to be worse after supper and disturbs sleep, never coughs up anything much at all rec Prednisone 10 mg take  4 each am x 2 days,   2 each am x 2 days,  1 each am x 2 days and stop  Take delsym two tsp every 12 hours and supplement if needed with  tramadol 50 mg up to 2 every 4 hours to suppress the urge to cough.  . Once you have eliminated the cough for 3 straight days try reducing the tramadol first,  then the delsym as tolerated.   Pantoprazole (protonix) 40 mg   Take 30-60 min before first meal of the day and Pepcid 20 mg one bedtime singulair 10 mg daily   08/21/2013 f/u ov/Hendrixx Severin re: recurrent cough/  Chief Complaint  Patient presents with  . Follow-up    Pt states that his cough has almost resolved. His breathing has improved some, but "sometimes  feel like I am breathing under water".  Has not had to use rescue inhaler since his last visit.   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 asthma Chief Complaint  Patient presents with  . Acute Visit    Pt c/o chest congestion and cough for the past 6 days- started out with ear pain and his PCP gave zithromax.  Cough is non prod.   did not activate action plan. Feels cough rattling in chest but not productive. Using clariton for nasal/ ear congestion s benefit  and no better p zpak rec Take delsym two tsp every 12 hours and supplement if needed with tylenol #3 every 4 hours to suppress the urge to cough.   Augmentin 875 mg take one pill twice daily  X 10 days - take at breakfast and supper with large glass of water.  It would help reduce the usual side effects (diarrhea and yeast infections) if you ate cultured yogurt at lunch.  Prednisone 10 mg take  4 each am x 2 days,   2 each am x 2 days,  1 each am x 2 days and stop  Ear congestion > clariton D over the counter Plan A = Automatic = singulair 10 mg one daily until return  Plan B = Backup  Short or wheeze > albuterol up 2pff every 4 hours if needed Cough > delsym/ tylenol #3 1 every 4 hours Plan C = contingency = whenever you are having a respiratory flare:  Pantoprazole (protonix) 40 mg   Take  30-60 min before first meal of the day and Pepcid 20 mg one bedtime until 100% better    02/26/2014 f/u ov/Nakaila Freeze re: cough  On singulair for chronic rhinitis and ? Asthma plus gerd rx  Chief Complaint  Patient presents with  . Follow-up    Pt states that his cough has improved some.  He is still having popping in ear and diff with hearing, although this has also improved some.   cough was worse after supper until bed and up all night coughing > better now  Last well 4 weeks prior to OV   Prednisone works the best  Severe ear popping and pain when flies, esp on R  Doesn't think singulair helping  No need for saba  >>pred paper    03/23/2014 Acute OV  Patient returns for persistent coughing.  Complains of worsening cough that is more frequent and deeper x6 days. Ooccasionally produces mucus, wheezing, tightness/heaviness, HA, some low grade temp over the weekend while out of town.   Saw ENT and was started on pred, flonase and will followup again 12/7. Gets some better but cough never goes away completely.  Last antibiotic  was 1 month ago He denies any chest pain, orthopnea, PND, hemoptysis, edema, nausea, vomiting overt reflux, or leg swelling. Has been using several things for cough control, including Delsym Hydromet, Tylenol 3 and tramadol. Says that Hydromet works the best for his cough Has had several courses of steroids over the last year We discussed the potential side effects of frequent steroid use and frequent narcotic use. Cough is disrupting his home life and working environment Patient is a  Clinical research associate.  He is a never smoker. Denies any unusual hobbies or pets. Does travel frequently to [place]. Has 1 dog. Lives in a rental home w/ no carpet.  rec Return for labs and chest x-ray Begin Delsym 2 teaspoons twice daily Begin Tessalon perles 1 Three times a day   May use Hydromet 1-2 teaspoons every 6 hours as needed for cough control, may make you sleepy Begin Chlortrimeton  4  mg 2 at bedtime May use Chlor-Trimeton 4 mg 1 every 4 hours as needed. For postnasal drip. Throat clearing and drainage, may make you sleepy Continue with Protonix daily before meal Continue with Pepcid at bedti   ENT > R Tympanic tube early Dec 2015    04/19/2014 f/u ov/Aadhya Bustamante re: recurrent cough x 2 months  Chief Complaint  Patient presents with  . Follow-up  PFT done today. Pt states that his cough is some better.  No new co's.   cough worse as day goes on at peaks at hs despite max gerd rx Coughed up some green last week  Not limited by breathing from desired activities   Not convinced resp to saba or worse off singulair  >>PPI/Pepcid/pred pack and hycodan rx   07/26/2014 Acute OV  Presents for an acute office visit.  Complains of cough for last 4 days. Minimally productive.  No fever, discolored mucus , chest pain, orthopnea, n/v/d.  Seen last ov for chronic cough , tx w/ steroids , PPI/Pepcid and chlortrimeton . Says cough went totally away for last 3 months . Feels he caught a cold last week and cough started back . He restarted protonix . Does not have any cough syrup.  Had recent ankle surgery and trip [place]. No calf pain, chest pain or hemoptysis.  He is leaving to go [place] for work in 3 days. Wants to get  Better before leaving.  Previous PFT and cxr nml .  No sign sinus congestion.  rec Restart  Pantoprazole (protonix) 40 mg   Take 30-60 min before first meal of the day and Pepcid 20 mg one bedtime until return to office - this is the best way to tell whether stomach acid is contributing to your problem.   Prednisone taper over next week.  Delsym 2 tsp Twice daily  As needed  Cough .  For drainage take chlortrimeton (chlorpheniramine) 4 mg every 4 hours available over the counter (may cause drowsiness)  Hycodan 1 tsp every 4 hours as needed for cough , may make you sleepy.     12/03/2014 acute  ov/Ector Laurel re: acute flare of uacs p et  Chief Complaint  Patient presents with   . Acute Visit    Pt c/o increased cough for the past 3 days- had back surgery the day before this started. He also c/o minimal chest tightness.     Was doing fine then ET 12/01/14 for back surgery  then 8/11 onset hacking cough/ sob not on acid rx / dry hacking quality to point hard to catch his breath but otherwise really not sob   No obvious day to day or daytime variability    or cp or   subjective wheeze or overt sinus or hb symptoms. No unusual exp hx or h/o childhood pna/ asthma or knowledge of premature birth.  Sleeping ok without nocturnal  or early am exacerbation  of respiratory  c/o's or need for noct saba. Also denies any obvious fluctuation of symptoms with weather or environmental changes or other aggravating or alleviating factors except as outlined above   Current Medications, Allergies, Complete Past Medical History, Past Surgical History, Family History, and Social History were reviewed in [employer] record.  ROS  The following are not active complaints unless bolded sore throat, dysphagia, dental problems, itching, sneezing,  nasal congestion or excess/ purulent secretions, ear ache,   fever, chills, sweats, unintended wt loss, classically pleuritic or exertional cp, hemoptysis,  orthopnea pnd or leg swelling, presyncope, palpitations, abdominal pain, anorexia, nausea, vomiting, diarrhea  or change in bowel or bladder habits, change in stools or urine, dysuria,hematuria,  rash, arthralgias, visual complaints, headache, numbness, weakness or ataxia or problems with walking or coordination,  change in mood/affect or memory.                Objective:   Physical Exam  amb wm  nad   Wt Readings from Last 3 Encounters:  12/03/14 233 lb (105.688 kg)  11/30/14 232 lb (105.235 kg)  11/22/14 232 lb (105.235 kg)    Vital signs reviewed      HEENT: nl dentition, turbinates, and orophanx. Nl external ear canals without cough reflex/    NECK :  without  JVD/Nodes/TM/ nl carotid upstrokes bilaterally   LUNGS: no acc muscle use,  No wheezing or stridor , CTA   CV:  RRR  no s3 or murmur or increase in P2, no edema , neg calf pain, neg homans sign   ABD:  soft and nontender with nl excursion in the supine position. No bruits or organomegaly, bowel sounds nl  MS:  warm without deformities, calf tenderness, cyanosis or clubbing  SKIN: warm and dry without lesions        CXR PA and Lateral:   12/03/2014 :     I personally reviewed images and agree with radiology impression as follows:   No active disease.         Assessment & Plan:

## 2014-12-04 ENCOUNTER — Encounter: Payer: Self-pay | Admitting: Internal Medicine

## 2014-12-04 NOTE — Assessment & Plan Note (Signed)
-   rx singulair 07/18/2013 > improved 08/21/13 > try off singulair 03/02/14 as pt not convinced helped - Sinus CT 08/28/13 > Sinuses are essentially clear with only minor dependent secretions noted in the inferior right frontal and left maxillary sinuses. There is anatomic narrowing of the ostiomeatal complexes, incompletely evaluated on this limited sinus study. - 02/26/2014 refer to ENT ([facility])> neg sinus dz/ pos ET dysfunction rec flonase  -03/26/14  Allergy profile:  eos 0,  IGgE 87 Dog=cat, dust, ragweed  - 04/19/14  PFTs wnl including fef 25-75 off singulair and all bronchodilators   Flare p ET :  Of the three most common causes of chronic cough, only one (GERD)  can actually cause the other two (asthma and post nasal drip syndrome)  and perpetuate the cylce of cough inducing airway trauma, inflammation, heightened sensitivity to reflux which is prompted by the cough itself via a cyclical mechanism.    This may partially respond to steroids and look like asthma and post nasal drainage but never erradicated completely unless the cough and the secondary reflux are eliminated, preferably both at the same time.  While not intuitively obvious, many patients with chronic low grade reflux do not cough until there is a secondary insult that disturbs the protective epithelial barrier and exposes sensitive nerve endings.  This can be viral or direct physical injury such as with an endotracheal tube.   The point is that once this occurs, it is difficult to eliminate using anything but a maximally effective acid suppression regimen at least in the short run, accompanied by an appropriate diet to address non acid GERD.   I had an extended discussion with the patient reviewing all relevant studies completed to date and  lasting 15 to 20 minutes of a 25 minute visit    Each maintenance medication was reviewed in detail including most importantly the difference between maintenance and prns and under what circumstances  the prns are to be triggered using an action plan format that is not reflected in the computer generated alphabetically organized AVS.    Please see instructions for details which were reviewed in writing and the patient given a copy highlighting the part that I personally wrote and discussed at today's ov.

## 2015-02-16 ENCOUNTER — Other Ambulatory Visit: Payer: Self-pay | Admitting: Internal Medicine

## 2015-03-01 ENCOUNTER — Ambulatory Visit (INDEPENDENT_AMBULATORY_CARE_PROVIDER_SITE_OTHER): Payer: BLUE CROSS/BLUE SHIELD | Admitting: Internal Medicine

## 2015-03-01 ENCOUNTER — Encounter: Payer: Self-pay | Admitting: Internal Medicine

## 2015-03-01 VITALS — BP 140/82 | HR 99 | Temp 98.8°F | Ht 72.0 in | Wt 243.8 lb

## 2015-03-01 DIAGNOSIS — H6983 Other specified disorders of Eustachian tube, bilateral: Secondary | ICD-10-CM

## 2015-03-01 DIAGNOSIS — R05 Cough: Secondary | ICD-10-CM

## 2015-03-01 DIAGNOSIS — R058 Other specified cough: Secondary | ICD-10-CM

## 2015-03-01 DIAGNOSIS — E669 Obesity, unspecified: Secondary | ICD-10-CM

## 2015-03-01 MED ORDER — PREDNISONE 10 MG PO TABS: ORAL_TABLET | ORAL | Status: DC

## 2015-03-01 MED ORDER — AZITHROMYCIN 250 MG PO TABS: ORAL_TABLET | ORAL | Status: DC

## 2015-03-01 MED ORDER — HYDROCODONE-HOMATROPINE 5-1.5 MG/5ML PO SYRP: 5.0000 mL | ORAL_SOLUTION | Freq: Four times a day (QID) | ORAL | Status: DC | PRN

## 2015-03-01 NOTE — Progress Notes (Signed)
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  Prev allergy eval in [place] [facility] cats> dogs (n/a for him) referred 07/17/2013 by Dr Duane Lope for cough eval.   History of Present Illness  07/17/2013 1st [facility] office visit/ Lazariah Savard  Chief Complaint  Patient presents with  . Pulmonary Consult    Referred per Dr. Sigmund Hazel. Pt c/o persistant cough for the past 3 years, but worse for the past year. He also c/o DOE with minimal exertion.    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   qvar not helping - already rx with zpak and prednisone.  Only thing that really knocks it out is tussionex.  Sob mostly just with coughing. Cough tends to be worse after supper and disturbs sleep, never coughs up anything much at all rec Prednisone 10 mg take  4 each am x 2 days,   2 each am x 2 days,  1 each am x 2 days and stop  Take delsym two tsp every 12 hours and supplement if needed with  tramadol 50 mg up to 2 every 4 hours to suppress the urge to cough.  . Once you have eliminated the cough for 3 straight days try reducing the tramadol first,  then the delsym as tolerated.   Pantoprazole (protonix) 40 mg   Take 30-60 min before first meal of the day and Pepcid 20 mg one bedtime singulair 10 mg daily   08/21/2013 f/u ov/Alyanna Stoermer re: recurrent cough/  Chief Complaint  Patient presents with  . Follow-up    Pt states that his cough has almost resolved. His breathing has improved some, but "sometimes  feel like I am breathing under water".  Has not had to use rescue inhaler since his last visit.   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 asthma Chief Complaint  Patient presents with  . Acute Visit    Pt c/o chest congestion and cough for the past 6 days- started out with ear pain and his PCP gave zithromax.  Cough is non prod.   did not activate action plan. Feels cough rattling in chest but not productive. Using clariton for nasal/ ear congestion s benefit  and no better p zpak rec Take delsym two tsp every 12 hours and supplement if needed with tylenol #3 every 4 hours to suppress the urge to cough.   Augmentin 875 mg take one pill twice daily  X 10 days - take at breakfast and supper with large glass of water.  It would help reduce the usual side effects (diarrhea and yeast infections) if you ate cultured yogurt at lunch.  Prednisone 10 mg take  4 each am x 2 days,   2 each am x 2 days,  1 each am x 2 days and stop  Ear congestion > clariton D over the counter Plan A = Automatic = singulair 10 mg one daily until return  Plan B = Backup  Short or wheeze > albuterol up 2pff every 4 hours if needed Cough > delsym/ tylenol #3 1 every 4 hours Plan C = contingency = whenever you are having a respiratory flare:  Pantoprazole (protonix) 40 mg   Take  30-60 min before first meal of the day and Pepcid 20 mg one bedtime until 100% better    02/26/2014 f/u ov/Clemencia Helzer re: cough  On singulair for chronic rhinitis and ? Asthma plus gerd rx  Chief Complaint  Patient presents with  . Follow-up    Pt states that his cough has improved some.  He is still having popping in ear and diff with hearing, although this has also improved some.   cough was worse after supper until bed and up all night coughing > better now  Last well 4 weeks prior to OV   Prednisone works the best  Severe ear popping and pain when flies, esp on R  Doesn't think singulair helping  No need for saba  >>pred paper    03/23/2014 Acute OV  Patient returns for persistent coughing.  Complains of worsening cough that is more frequent and deeper x6 days. Ooccasionally produces mucus, wheezing, tightness/heaviness, HA, some low grade temp over the weekend while out of town.   Saw ENT and was started on pred, flonase and will followup again 12/7. Gets some better but cough never goes away completely.  Last antibiotic  was 1 month ago He denies any chest pain, orthopnea, PND, hemoptysis, edema, nausea, vomiting overt reflux, or leg swelling. Has been using several things for cough control, including Delsym Hydromet, Tylenol 3 and tramadol. Says that Hydromet works the best for his cough Has had several courses of steroids over the last year We discussed the potential side effects of frequent steroid use and frequent narcotic use. Cough is disrupting his home life and working environment Patient is a  Clinical research associate.  He is a never smoker. Denies any unusual hobbies or pets. Does travel frequently to [place]. Has 1 dog. Lives in a rental home w/ no carpet.  rec Return for labs and chest x-ray Begin Delsym 2 teaspoons twice daily Begin Tessalon perles 1 Three times a day   May use Hydromet 1-2 teaspoons every 6 hours as needed for cough control, may make you sleepy Begin Chlortrimeton  4  mg 2 at bedtime May use Chlor-Trimeton 4 mg 1 every 4 hours as needed. For postnasal drip. Throat clearing and drainage, may make you sleepy Continue with Protonix daily before meal Continue with Pepcid at bedti   ENT > R Tympanic tube early Dec 2015    04/19/2014 f/u ov/Jayesh Marbach re: recurrent cough x 2 months  Chief Complaint  Patient presents with  . Follow-up  PFT done today. Pt states that his cough is some better.  No new co's.   cough worse as day goes on at peaks at hs despite max gerd rx Coughed up some green last week  Not limited by breathing from desired activities   Not convinced resp to saba or worse off singulair  >>PPI/Pepcid/pred pack and hycodan rx   07/26/2014 Acute OV  Presents for an acute office visit.  Complains of cough for last 4 days. Minimally productive.  No fever, discolored mucus , chest pain, orthopnea, n/v/d.  Seen last ov for chronic cough , tx w/ steroids , PPI/Pepcid and chlortrimeton . Says cough went totally away for last 3 months . Feels he caught a cold last week and cough started back . He restarted protonix . Does not have any cough syrup.  Had recent ankle surgery and trip [place]. No calf pain, chest pain or hemoptysis.  He is leaving to go [place] for work in 3 days. Wants to get  Better before leaving.  Previous PFT and cxr nml .  No sign sinus congestion.  rec Restart  Pantoprazole (protonix) 40 mg   Take 30-60 min before first meal of the day and Pepcid 20 mg one bedtime until return to office - this is the best way to tell whether stomach acid is contributing to your problem.   Prednisone taper over next week.  Delsym 2 tsp Twice daily  As needed  Cough .  For drainage take chlortrimeton (chlorpheniramine) 4 mg every 4 hours available over the counter (may cause drowsiness)  Hycodan 1 tsp every 4 hours as needed for cough , may make you sleepy.     12/03/2014 acute  ov/Xayvion Shirah re: acute flare of uacs p et  Chief Complaint  Patient presents with   . Acute Visit    Pt c/o increased cough for the past 3 days- had back surgery the day before this started. He also c/o minimal chest tightness.     Was doing fine then ET 12/01/14 for back surgery  then 8/11 onset hacking cough/ sob not on acid rx / dry hacking quality to point hard to catch his breath but otherwise really not sob  rec Restart  Pantoprazole (protonix) 40 mg   Take 30-60 min before first meal of the day and Pepcid 20 mg one bedtime until  Cough is gone for a week without the need for cough suppression Prednisone taper over next week.  GERD diet   Hycodan 1 tsp every 4 hours as needed for cough , may make you sleepy.       03/01/2015  Acute  ov/Deandrea Vanpelt re: head cold to chest onset was acute  02/25/15  Chief Complaint  Patient presents with  . Acute Visit    pt states he started with just a cold and around sat/ sun it has gotten worse. pt c/o dry cough,SOB. no c/o wheezing or chest tightness, fever or chills.    still has dog in bedroom, had ducts cleaned in house.  Not taking pepcid at hs consistently whenever coughing as prev rec      No obvious patterns  day to day or daytime variability or sob/   cp or   subjective wheeze or   hb symptoms. No unusual exp hx or h/o childhood pna/ asthma or knowledge of premature birth.  Sleeping ok without nocturnal  or early am exacerbation  of respiratory  c/o's or need for noct saba. Also denies any obvious  fluctuation of symptoms with weather or environmental changes or other aggravating or alleviating factors except as outlined above   Current Medications, Allergies, Complete Past Medical History, Past Surgical History, Family History, and Social History were reviewed in [facility] electronic medical record.  ROS  The following are not active complaints unless bolded sore throat, dysphagia, dental problems, itching, sneezing,  nasal congestion or excess/ purulent secretions, ear ache,   fever, chills, sweats, unintended wt loss,  classically pleuritic or exertional cp, hemoptysis,  orthopnea pnd or leg swelling, presyncope, palpitations, abdominal pain, anorexia, nausea, vomiting, diarrhea  or change in bowel or bladder habits, change in stools or urine, dysuria,hematuria,  rash, arthralgias, visual complaints, headache, numbness, weakness or ataxia or problems with walking or coordination,  change in mood/affect or memory.                Objective:   Physical Exam  amb wm nad   03/01/2015        244   12/03/14 233 lb (105.688 kg)  11/30/14 232 lb (105.235 kg)  11/22/14 232 lb (105.235 kg)    Vital signs reviewed      HEENT: nl dentition, turbinates, and orophanx. Nl external ear canals without cough reflex/  Tympanic tube in place on R   NECK :  without JVD/Nodes/TM/ nl carotid upstrokes bilaterally   LUNGS: no acc muscle use,  No wheezing or stridor , CTA   CV:  RRR  no s3 or murmur or increase in P2, no edema , neg calf pain, neg homans sign   ABD:  soft and nontender with nl excursion in the supine position. No bruits or organomegaly, bowel sounds nl  MS:  warm without deformities, calf tenderness, cyanosis or clubbing  SKIN: warm and dry without lesions        CXR PA and Lateral:   12/03/2014 :     I personally reviewed images and agree with radiology impression as follows:   No active disease.         Assessment & Plan:

## 2015-03-01 NOTE — Patient Instructions (Addendum)
Restart  Pantoprazole (protonix) 40 mg   Take 30-60 min before first meal of the day and Pepcid 20 mg one bedtime until  Cough is gone for a week without the need for cough suppression  Prednisone 10 mg take  4 each am x 2 days,   2 each am x 2 days,  1 each am x 2 days and stop   advil cold / sinus vs just pseudofed   Hycodan 1 tsp every 4 hours as needed for cough , may make you sleepy.   Zpak   GERD (REFLUX)  is an extremely common cause of respiratory symptoms just like yours , many times with no obvious heartburn at all.    It can be treated with medication, but also with lifestyle changes including elevation of the head of your bed (ideally with 6 inch  bed blocks),  Smoking cessation, avoidance of late meals, excessive alcohol, and avoid fatty foods, chocolate, peppermint, colas, red wine, and acidic juices such as orange juice.  NO MINT OR MENTHOL PRODUCTS SO NO COUGH DROPS  USE SUGARLESS CANDY INSTEAD (Jolley ranchers or Stover's or Life Savers) or even ice chips will also do - the key is to swallow to prevent all throat clearing. NO OIL BASED VITAMINS - use powdered substitutes.       Return if not better before you fly to [facility]

## 2015-03-02 ENCOUNTER — Encounter: Payer: Self-pay | Admitting: Internal Medicine

## 2015-03-02 DIAGNOSIS — E669 Obesity, unspecified: Secondary | ICD-10-CM | POA: Insufficient documentation

## 2015-03-02 NOTE — Assessment & Plan Note (Signed)
Body mass index is 33.06 kg/(m^2).  No results found for: TSH   Contributing to gerd tendency/  reviewed the need and the process to achieve and maintain neg calorie balance > defer f/u primary care including intermittently monitoring thyroid status

## 2015-03-02 NOTE — Assessment & Plan Note (Signed)
See ent eval by Suszanne Connerseoh 03/08/14  rec flonase and R Tympanic tube  > in good position as of 03/01/2015   rec advil cold and sinus instead of  antihistamines for colds as they may cause excessive dryness and mucus impaction of the sinus channels and ear canals.

## 2015-03-02 NOTE — Assessment & Plan Note (Addendum)
-   rx singulair 07/18/2013 > improved 08/21/13 > try off singulair 03/02/14 as pt not convinced helped - Sinus CT 08/28/13 > Sinuses are essentially clear with only minor dependent secretions noted in the inferior right frontal and left maxillary sinuses. There is anatomic narrowing of the ostiomeatal complexes, incompletely evaluated on this limited sinus study. - 02/26/2014 refer to ENT ([facility])> neg sinus dz/ pos ET dysfunction rec flonase  -03/26/14  Allergy profile:  eos 0,  IGgE 87 Dog=cat, dust, ragweed  - 04/19/14  PFTs wnl including fef 25-75 off singulair and all bronchodilators    Explained the natural history of uri and why it's necessary in patients at risk to treat GERD aggressively - at least  short term -   to reduce risk of evolving cyclical cough initially  triggered by epithelial injury and a heightened sensitivty to the effects of any upper airway irritants,  most importantly acid - related - then perpetuated by epithelial injury related to the cough itself as the upper airway collapses on itself.  That is, the more sensitive the epithelium becomes once it is damaged by the virus, the more the ensuing irritability> the more the cough, the more the secondary reflux (especially in those prone to reflux) the more the irritation of the sensitive mucosa and so on in a  Classic cyclical pattern.    He does not appear to have significant bacterial infection at present so Z-Pak and a short course of prednisone should be appropriate.  I had an extended discussion with the patient reviewing all relevant studies completed to date and  lasting 15 to 20 minutes of a 25 minute visit    Each maintenance medication was reviewed in detail including most importantly the difference between maintenance and prns and under what circumstances the prns are to be triggered using an action plan format that is not reflected in the computer generated alphabetically organized AVS.    Please see instructions for  details which were reviewed in writing and the patient given a copy highlighting the part that I personally wrote and discussed at today's ov.

## 2015-03-06 ENCOUNTER — Other Ambulatory Visit: Payer: Self-pay | Admitting: Internal Medicine

## 2015-03-23 ENCOUNTER — Other Ambulatory Visit: Payer: Self-pay

## 2015-03-23 MED ORDER — ATORVASTATIN CALCIUM 40 MG PO TABS: ORAL_TABLET | ORAL | Status: DC

## 2015-03-29 ENCOUNTER — Other Ambulatory Visit: Payer: Self-pay | Admitting: *Deleted

## 2015-03-29 MED ORDER — FENOFIBRATE 145 MG PO TABS: 145.0000 mg | ORAL_TABLET | Freq: Every day | ORAL | Status: DC

## 2015-03-29 MED ORDER — ATORVASTATIN CALCIUM 40 MG PO TABS: ORAL_TABLET | ORAL | Status: DC

## 2015-04-14 ENCOUNTER — Encounter: Payer: Self-pay | Admitting: Internal Medicine

## 2015-04-14 ENCOUNTER — Ambulatory Visit (INDEPENDENT_AMBULATORY_CARE_PROVIDER_SITE_OTHER): Payer: BLUE CROSS/BLUE SHIELD | Admitting: Internal Medicine

## 2015-04-14 VITALS — BP 116/80 | HR 100 | Temp 98.5°F | Wt 249.0 lb

## 2015-04-14 DIAGNOSIS — R05 Cough: Secondary | ICD-10-CM

## 2015-04-14 DIAGNOSIS — R058 Other specified cough: Secondary | ICD-10-CM

## 2015-04-14 LAB — NITRIC OXIDE: Nitric Oxide: 29

## 2015-04-14 MED ORDER — HYDROCODONE-HOMATROPINE 5-1.5 MG/5ML PO SYRP: 5.0000 mL | ORAL_SOLUTION | Freq: Four times a day (QID) | ORAL | Status: DC | PRN

## 2015-04-14 MED ORDER — PREDNISONE 10 MG PO TABS: ORAL_TABLET | ORAL | Status: DC

## 2015-04-14 MED ORDER — MOMETASONE FUROATE 50 MCG/ACT NA SUSP
NASAL | Status: DC
Start: 2015-04-14 — End: 2015-06-17

## 2015-04-14 NOTE — Assessment & Plan Note (Addendum)
-   rx singulair 07/18/2013 > improved 08/21/13 > try off singulair 03/02/14 as pt not convinced helped - Sinus CT 08/28/13 > Sinuses are essentially clear with only minor dependent secretions noted in the inferior right frontal and left maxillary sinuses. There is anatomic narrowing of the ostiomeatal complexes, incompletely evaluated on this limited sinus study. - 02/26/2014 refer to ENT ([facility])> neg sinus dz/ pos ET dysfunction rec flonase  -03/26/14  Allergy profile:  eos 0,  IGgE 87 Dog=cat, dust, ragweed  - 04/19/14  PFTs wnl including fef 25-75 off singulair and all bronchodilators  - NO 04/14/2015 during flare = 28     Symptoms recurrent, difficult to control. DDX of  difficult airways management all start with A and  include Adherence, Ace Inhibitors, Acid Reflux, Active Sinus Disease, Alpha 1 Antitripsin deficiency, Anxiety masquerading as Airways dz,  ABPA,  allergy(esp in young), Aspiration (esp in elderly), Adverse effects of meds,  Active smokers, A bunch of PE's (a small clot burden can't cause this syndrome unless there is already severe underlying pulm or vascular dz with poor reserve) plus two Bs  = Bronchiectasis and Beta blocker use..and one C= CHF   Adherence is always the initial "prime suspect" and is a multilayered concern that requires a "trust but verify" approach in every patient - starting with knowing how to use medications, especially inhalers, correctly, keeping up with refills and understanding the fundamental difference between maintenance and prns vs those medications only taken for a very short course and then stopped and not refilled.  - needs to understand contingency for gerd rx whenever cough first starts   ? Acid (or non-acid) GERD > always difficult to exclude as up to 75% of pts in some series report no assoc GI/ Heartburn symptoms> rec max (24h)  acid suppression and diet restrictions/ reviewed and instructions given in writing/ should be continued until mct  done  ? Allergy/asthma > less likely since flare with NO so low but needs MCt to complete the w/u   ? Active sinus dz less likely based on previous eval   Will repeat short course pred and max rx for cyclical cough elimination then proceed with mct while on gerd rx next  I had an extended discussion with the patient reviewing all relevant studies completed to date and  lasting 15 to 20 minutes of a 25 minute visit    Each maintenance medication was reviewed in detail including most importantly the difference between maintenance and prns and under what circumstances the prns are to be triggered using an action plan format that is not reflected in the computer generated alphabetically organized AVS.    Please see instructions for details which were reviewed in writing and the patient given a copy highlighting the part that I personally wrote and discussed at today's ov.

## 2015-04-14 NOTE — Patient Instructions (Addendum)
Restart Pantoprazole (protonix) 40 mg   Take 30-60 min before first meal of the day and Pepcid 20 mg one bedtime until  Cough is gone for a week without the need for cough suppression and restart immediately at the first sign of any flare   Prednisone 10 mg take  4 each am x 2 days,   2 each am x 2 days,  1 each am x 2 days and stop   Resume nasonex one twice daily as a new maintenance therapy   For nasal congestion >> Advil cold / sinus for nose   For cough >> hycodan 1 tsp every 4 hours as needed    For short of breath > albuterol 2 puff every 4 hours as needed   Please see patient coordinator before you leave today  to schedule methacholine challenge for right after the holidays

## 2015-04-14 NOTE — Progress Notes (Signed)
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  Prev allergy eval in [place] Pos cats> dogs (n/a for him) referred 07/17/2013 by Luke Wong for cough eval.   History of Present Illness  07/17/2013 1st [facility] office visit/ Luke Wong  Chief Complaint  Patient presents with  . Pulmonary Consult    Referred per Luke Wong. Pt c/o persistant cough for the past 3 years, but worse for the past year. He also c/o DOE with minimal exertion.    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   qvar not helping - already rx with zpak and prednisone.  Only thing that really knocks it out is tussionex.  Sob mostly just with coughing. Cough tends to be worse after supper and disturbs sleep, never coughs up anything much at all rec Prednisone 10 mg take  4 each am x 2 days,   2 each am x 2 days,  1 each am x 2 days and stop  Take delsym two tsp every 12 hours and supplement if needed with  tramadol 50 mg up to 2 every 4 hours to suppress the urge to cough.  . Once you have eliminated the cough for 3 straight days try reducing the tramadol first,  then the delsym as tolerated.   Pantoprazole (protonix) 40 mg   Take 30-60 min before first meal of the day and Pepcid 20 mg one bedtime singulair 10 mg daily   08/21/2013 f/u ov/Luke Wong re: recurrent cough/  Chief Complaint  Patient presents with  . Follow-up    Pt states that his cough has almost resolved. His breathing has improved some, but "sometimes  feel like I am breathing under water".  Has not had to use rescue inhaler since his last visit.   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 asthma Chief Complaint  Patient presents with  . Acute Visit    Pt c/o chest congestion and cough for the past 6 days- started out with ear pain and his PCP gave zithromax.  Cough is non prod.   did not activate action plan. Feels cough rattling in chest but not productive. Using clariton for nasal/ ear congestion s benefit  and no better p zpak rec Take delsym two tsp every 12 hours and supplement if needed with tylenol #3 every 4 hours to suppress the urge to cough.   Augmentin 875 mg take one pill twice daily  X 10 days - take at breakfast and supper with large glass of water.  It would help reduce the usual side effects (diarrhea and yeast infections) if you ate cultured yogurt at lunch.  Prednisone 10 mg take  4 each am x 2 days,   2 each am x 2 days,  1 each am x 2 days and stop  Ear congestion > clariton D over the counter Plan A = Automatic = singulair 10 mg one daily until return  Plan B = Backup  Short or wheeze > albuterol up 2pff every 4 hours if needed Cough > delsym/ tylenol #3 1 every 4 hours Plan C = contingency = whenever you are having a respiratory flare:  Pantoprazole (protonix) 40 mg   Take  30-60 min before first meal of the day and Pepcid 20 mg one bedtime until 100% better    02/26/2014 f/u ov/Luke Wong re: cough  On singulair for chronic rhinitis and ? Asthma plus gerd rx  Chief Complaint  Patient presents with  . Follow-up    Pt states that his cough has improved some.  He is still having popping in ear and diff with hearing, although this has also improved some.   cough was worse after supper until bed and up all night coughing > better now  Last well 4 weeks prior to OV   Prednisone works the best  Severe ear popping and pain when flies, esp on R  Doesn't think singulair helping  No need for saba  >>pred paper    03/23/2014 NP Acute OV  Patient returns for persistent coughing.  Complains of worsening cough that is more frequent and deeper x6 days. Ooccasionally produces mucus, wheezing, tightness/heaviness, HA, some low grade temp over the weekend while out of town.   Saw ENT and was started on pred, flonase and will followup again 12/7. Gets some better but cough never goes away completely.  Last antibiotic  was 1 month ago He denies any chest pain, orthopnea, PND, hemoptysis, edema, nausea, vomiting overt reflux, or leg swelling. Has been using several things for cough control, including Delsym Hydromet, Tylenol 3 and tramadol. Says that Hydromet works the best for his cough Has had several courses of steroids over the last year We discussed the potential side effects of frequent steroid use and frequent narcotic use. Cough is disrupting his home life and working environment Patient is a  Clinical research associate.  He is a never smoker. Denies any unusual hobbies or pets. Does travel frequently to [place]. Has 1 dog. Lives in a rental home w/ no carpet.  rec Return for labs and chest x-ray Begin Delsym 2 teaspoons twice daily Begin Tessalon perles 1 Three times a day   May use Hydromet 1-2 teaspoons every 6 hours as needed for cough control, may make you sleepy Begin Chlortrimeton   4 mg 2 at bedtime May use Chlor-Trimeton 4 mg 1 every 4 hours as needed. For postnasal drip. Throat clearing and drainage, may make you sleepy Continue with Protonix daily before meal Continue with Pepcid at bedti   ENT > R Tympanic tube early Dec 2015    04/19/2014 f/u ov/Wasil Wolke re: recurrent cough x 2 months  Chief Complaint  Patient presents with  . Follow-up  PFT done today. Pt states that his cough is some better.  No new co's.   cough worse as day goes on at peaks at hs despite max gerd rx Coughed up some green last week  Not limited by breathing from desired activities   Not convinced resp to saba or worse off singulair  >>PPI/Pepcid/pred pack and hycodan rx   07/26/2014 NP Acute OV  Presents for an acute office visit.  Complains of cough for last 4 days. Minimally productive.  No fever, discolored mucus , chest pain, orthopnea, n/v/d.  Seen last ov for chronic cough , tx w/ steroids , PPI/Pepcid and chlortrimeton . Says cough went totally away for last 3 months . Feels he caught a cold last week and cough started back . He restarted protonix . Does not have any cough syrup.  Had recent ankle surgery and trip [place]. No calf pain, chest pain or hemoptysis.  He is leaving to go [place] for work in 3 days. Wants to get  Better before leaving.  Previous PFT and cxr nml .  No sign sinus congestion.  rec Restart  Pantoprazole (protonix) 40 mg   Take 30-60 min before first meal of the day and Pepcid 20 mg one bedtime until return to office - this is the best way to tell whether stomach acid is contributing to your problem.   Prednisone taper over next week.  Delsym 2 tsp Twice daily  As needed  Cough .  For drainage take chlortrimeton (chlorpheniramine) 4 mg every 4 hours available over the counter (may cause drowsiness)  Hycodan 1 tsp every 4 hours as needed for cough , may make you sleepy.     12/03/2014 acute  ov/Sadhana Frater re: acute flare of uacs p et  Chief Complaint  Patient presents  with  . Acute Visit    Pt c/o increased cough for the past 3 days- had back surgery the day before this started. He also c/o minimal chest tightness.    Was doing fine then ET 12/01/14 for back surgery  then 8/11 onset hacking cough/ sob not on acid rx / dry hacking quality to point hard to catch his breath but otherwise really not sob  rec Restart  Pantoprazole (protonix) 40 mg   Take 30-60 min before first meal of the day and Pepcid 20 mg one bedtime until  Cough is gone for a week without the need for cough suppression Prednisone taper over next week.  GERD diet   Hycodan 1 tsp every 4 hours as needed for cough , may make you sleepy.       03/01/2015  Acute  ov/Aaradhya Kysar re: head cold to chest onset was acute  02/25/15  Chief Complaint  Patient presents with  . Acute Visit    pt states he started with just a cold and around sat/ sun it has gotten worse. pt c/o dry cough,SOB. no c/o wheezing or chest tightness, fever or chills.   still has dog in bedroom, had ducts cleaned in house.  Not taking pepcid at hs consistently whenever coughing as prev rec  rec Restart  Pantoprazole (protonix) 40 mg   Take 30-60 min before first meal of the day and Pepcid 20 mg one bedtime until  Cough is gone for a week without the need for cough suppression Prednisone 10 mg take  4 each am x 2 days,   2 each am x 2 days,  1 each am x 2 days and stop  advil  cold / sinus vs just pseudofed  Hycodan 1 tsp every 4 hours as needed for cough , may make you sleepy.  Zpak  GERD diet    04/14/2015  f/u ov/Orville Widmann re: recurrent cough  Chief Complaint  Patient presents with  . Acute Visit    Pt c/o cough non prod, chest tightness and SOB for the past 2 days.   gets completely better where no need for any med including no nasal steroids  then relapsed 2 days prior to OV   Cough is worse at hs and present all day long but no excess/ purulent sputum or mucus plugs    No obvious patterns  day to day or daytime variability or  cp or   subjective wheeze or   hb symptoms. No unusual exp hx or h/o childhood pna/ asthma or knowledge of premature birth.  Sleeping ok without nocturnal  or early am exacerbation  of respiratory  c/o's or need for noct saba. Also denies any obvious fluctuation of symptoms with weather or environmental changes or other aggravating or alleviating factors except as outlined above   Current Medications, Allergies, Complete Past Medical History, Past Surgical History, Family History, and Social History were reviewed in [employer] record.  ROS  The following are not active complaints unless bolded sore throat, dysphagia, dental problems, itching, sneezing,  nasal congestion or excess/ purulent secretions, ear ache,   fever, chills, sweats, unintended wt loss, classically pleuritic or exertional cp, hemoptysis,  orthopnea pnd or leg swelling, presyncope, palpitations, abdominal pain, anorexia, nausea, vomiting, diarrhea  or change in bowel or bladder habits, change in stools or urine, dysuria,hematuria,  rash, arthralgias, visual complaints, headache, numbness, weakness or ataxia or problems with walking or coordination,  change in mood/affect or memory.                Objective:   Physical Exam  amb wm harsh upper airway cough   04/14/2015      249  03/01/2015        244   12/03/14 233 lb (105.688 kg)  11/30/14 232 lb (105.235 kg)  11/22/14 232 lb (105.235 kg)    Vital signs reviewed      HEENT: nl dentition, turbinates, and orophanx. Nl external ear canals without cough reflex/  Tympanic tube in place on R   NECK :  without JVD/Nodes/TM/ nl carotid upstrokes bilaterally   LUNGS: no acc muscle use,  No wheezing or stridor , CTA   CV:  RRR  no s3 or murmur or increase in P2, no edema , neg calf pain, neg homans sign   ABD:  soft and nontender with nl excursion in the supine position. No bruits or organomegaly, bowel sounds nl  MS:  warm without deformities,  calf tenderness, cyanosis or clubbing  SKIN: warm and dry without lesions             Assessment & Plan:

## 2015-04-19 ENCOUNTER — Other Ambulatory Visit: Payer: Self-pay | Admitting: Internal Medicine

## 2015-04-27 ENCOUNTER — Telehealth: Payer: Self-pay | Admitting: Internal Medicine

## 2015-04-27 NOTE — Telephone Encounter (Signed)
Nothing else to offer over the phone if what I already suggested did not help so needs ov with all meds in hand and we'll schedule a methacholine challenge study then to sort this all out.  Ok to add on

## 2015-04-27 NOTE — Telephone Encounter (Addendum)
Patient has Methacholine Challenge scheduled for 05/06/15.  Patient scheduled to see MW tomorrow at 11:15 Patient aware of appointment. Nothing further needed.

## 2015-04-27 NOTE — Telephone Encounter (Signed)
Spoke with pt, finished pred taper from last ov, still not feeling better; c/o prod cough with clear/white mucus, chest tightness, shortness of breath.  Denies fever.  Requesting further recs. Pt uses [facility] on [address]   MW please advise on recs.  Thanks!    Patient Instructions       Restart Pantoprazole (protonix) 40 mg   Take 30-60 min before first meal of the day and Pepcid 20 mg one bedtime until  Cough is gone for a week without the need for cough suppression and restart immediately at the first sign of any flare   Prednisone 10 mg take  4 each am x 2 days,   2 each am x 2 days,  1 each am x 2 days and stop   Resume nasonex one twice daily as a new maintenance therapy   For nasal congestion >> Advil cold / sinus for nose   For cough >> hycodan 1 tsp every 4 hours as needed    For short of breath > albuterol 2 puff every 4 hours as needed   Please see patient coordinator before you leave today  to schedule methacholine challenge for right after the holidays

## 2015-04-28 ENCOUNTER — Telehealth: Payer: Self-pay | Admitting: Internal Medicine

## 2015-04-28 ENCOUNTER — Ambulatory Visit (INDEPENDENT_AMBULATORY_CARE_PROVIDER_SITE_OTHER): Payer: BLUE CROSS/BLUE SHIELD | Admitting: Internal Medicine

## 2015-04-28 ENCOUNTER — Encounter: Payer: Self-pay | Admitting: Internal Medicine

## 2015-04-28 VITALS — BP 132/84 | HR 86 | Ht 72.0 in | Wt 250.0 lb

## 2015-04-28 DIAGNOSIS — R05 Cough: Secondary | ICD-10-CM | POA: Diagnosis not present

## 2015-04-28 DIAGNOSIS — R058 Other specified cough: Secondary | ICD-10-CM

## 2015-04-28 MED ORDER — HYDROCODONE-HOMATROPINE 5-1.5 MG/5ML PO SYRP: 5.0000 mL | ORAL_SOLUTION | Freq: Four times a day (QID) | ORAL | Status: DC | PRN

## 2015-04-28 MED ORDER — PREDNISONE 10 MG PO TABS: ORAL_TABLET | ORAL | Status: DC

## 2015-04-28 NOTE — Patient Instructions (Addendum)
Goal is to elminate completely all coughing while on prednisone and continuing the full acid suppression regimen   Stay on the acid regimen along with the nasal spray  until the methacholine choline challenge   For drainage / throat tickle try take CHLORPHENIRAMINE  4 mg - take one every 4 hours as needed - available over the counter- may cause drowsiness so start with just a bedtime dose or two and see how you tolerate it before trying in daytime    Prednisone 10 mg take  4 each am x 2 days,   2 each am x 2 days,  1 each am x 2 days and stop   Hycodan 2 tsp every 3-4 hours x 3-5 days and stop   Please schedule a follow up office visit in 4 weeks, sooner if needed

## 2015-04-28 NOTE — Progress Notes (Signed)
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  Prev allergy eval in [place] cats> dogs (n/a for him) referred 07/17/2013 by Dr Duane LopeAlan Ross for cough eval.   History of Present Illness  07/17/2013 1st [facility] office visit/ Luke Wong  Chief Complaint  Patient presents with  . Pulmonary Consult    Referred per Dr. Sigmund HazelLisa Miller. Pt c/o persistant cough for the past 3 years, but worse for the past year. He also c/o DOE with minimal exertion.    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   qvar not helping - already rx with zpak and prednisone.  Only thing that really knocks it out is tussionex.  Sob mostly just with coughing. Cough tends to be worse after supper and disturbs sleep, never coughs up anything much at all rec Prednisone 10 mg take  4 each am x 2 days,   2 each am x 2 days,  1 each am x 2 days and stop  Take delsym two tsp every 12 hours and supplement if needed with  tramadol 50 mg up to 2 every 4 hours to suppress the urge to cough.  . Once you have eliminated the cough for 3 straight days try reducing the tramadol first,  then the delsym as tolerated.   Pantoprazole (protonix) 40 mg   Take 30-60 min before first meal of the day and Pepcid 20 mg one bedtime singulair 10 mg daily   08/21/2013 f/u ov/Luke Wong re: recurrent cough/  Chief Complaint  Patient presents with  . Follow-up    Pt states that his cough has almost resolved. His breathing has improved some, but "sometimes  feel like I am breathing under water".  Has not had to use rescue inhaler since his last visit.   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 asthma Chief Complaint  Patient presents with  . Acute Visit    Pt c/o chest congestion and cough for the past 6 days- started out with ear pain and his PCP gave zithromax.  Cough is non prod.   did not activate action plan. Feels cough rattling in chest but not productive. Using clariton for nasal/ ear congestion s benefit  and no better p zpak rec Take delsym two tsp every 12 hours and supplement if needed with tylenol #3 every 4 hours to suppress the urge to cough.   Augmentin 875 mg take one pill twice daily  X 10 days - take at breakfast and supper with large glass of water.  It would help reduce the usual side effects (diarrhea and yeast infections) if you ate cultured yogurt at lunch.  Prednisone 10 mg take  4 each am x 2 days,   2 each am x 2 days,  1 each am x 2 days and stop  Ear congestion > clariton D over the counter Plan A = Automatic = singulair 10 mg one daily until return  Plan B = Backup  Short or wheeze > albuterol up 2pff every 4 hours if needed Cough > delsym/ tylenol #3 1 every 4 hours Plan C = contingency = whenever you are having a respiratory flare:  Pantoprazole (protonix) 40 mg   Take  30-60 min before first meal of the day and Pepcid 20 mg one bedtime until 100% better    02/26/2014 f/u ov/Luke Wong re: cough  On singulair for chronic rhinitis and ? Asthma plus gerd rx  Chief Complaint  Patient presents with  . Follow-up    Pt states that his cough has improved some.  He is still having popping in ear and diff with hearing, although this has also improved some.   cough was worse after supper until bed and up all night coughing > better now  Last well 4 weeks prior to OV   Prednisone works the best  Severe ear popping and pain when flies, esp on R  Doesn't think singulair helping  No need for saba  >>pred paper    03/23/2014 NP Acute OV  Patient returns for persistent coughing.  Complains of worsening cough that is more frequent and deeper x6 days. Ooccasionally produces mucus, wheezing, tightness/heaviness, HA, some low grade temp over the weekend while out of town.   Saw ENT and was started on pred, flonase and will followup again 12/7. Gets some better but cough never goes away completely.  Last antibiotic  was 1 month ago He denies any chest pain, orthopnea, PND, hemoptysis, edema, nausea, vomiting overt reflux, or leg swelling. Has been using several things for cough control, including Delsym Hydromet, Tylenol 3 and tramadol. Says that Hydromet works the best for his cough Has had several courses of steroids over the last year We discussed the potential side effects of frequent steroid use and frequent narcotic use. Cough is disrupting his home life and working environment Patient is a  Clinical research associate.  He is a never smoker. Denies any unusual hobbies or pets. Does travel frequently to [place]. Has 1 dog. Lives in a rental home w/ no carpet.  rec Return for labs and chest x-ray Begin Delsym 2 teaspoons twice daily Begin Tessalon perles 1 Three times a day   May use Hydromet 1-2 teaspoons every 6 hours as needed for cough control, may make you sleepy Begin Chlortrimeton   4 mg 2 at bedtime May use Chlor-Trimeton 4 mg 1 every 4 hours as needed. For postnasal drip. Throat clearing and drainage, may make you sleepy Continue with Protonix daily before meal Continue with Pepcid at bedti   ENT > R Tympanic tube early Dec 2015    04/19/2014 f/u ov/Luke Wong re: recurrent cough x 2 months  Chief Complaint  Patient presents with  . Follow-up  PFT done today. Pt states that his cough is some better.  No new co's.   cough worse as day goes on at peaks at hs despite max gerd rx Coughed up some green last week  Not limited by breathing from desired activities   Not convinced resp to saba or worse off singulair  >>PPI/Pepcid/pred pack and hycodan rx   07/26/2014 NP Acute OV  Presents for an acute office visit.  Complains of cough for last 4 days. Minimally productive.  No fever, discolored mucus , chest pain, orthopnea, n/v/d.  Seen last ov for chronic cough , tx w/ steroids , PPI/Pepcid and chlortrimeton . Says cough went totally away for last 3 months . Feels he caught a cold last week and cough started back . He restarted protonix . Does not have any cough syrup.  Had recent ankle surgery and trip [place]. No calf pain, chest pain or hemoptysis.  He is leaving to go [place] for work in 3 days. Wants to get  Better before leaving.  Previous PFT and cxr nml .  No sign sinus congestion.  rec Restart  Pantoprazole (protonix) 40 mg   Take 30-60 min before first meal of the day and Pepcid 20 mg one bedtime until return to office - this is the best way to tell whether stomach acid is contributing to your problem.   Prednisone taper over next week.  Delsym 2 tsp Twice daily  As needed  Cough .  For drainage take chlortrimeton (chlorpheniramine) 4 mg every 4 hours available over the counter (may cause drowsiness)  Hycodan 1 tsp every 4 hours as needed for cough , may make you sleepy.     12/03/2014 acute  ov/Luke Wong re: acute flare of uacs p et  Chief Complaint  Patient presents  with  . Acute Visit    Pt c/o increased cough for the past 3 days- had back surgery the day before this started. He also c/o minimal chest tightness.    Was doing fine then ET 12/01/14 for back surgery  then 8/11 onset hacking cough/ sob not on acid rx / dry hacking quality to point hard to catch his breath but otherwise really not sob  rec Restart  Pantoprazole (protonix) 40 mg   Take 30-60 min before first meal of the day and Pepcid 20 mg one bedtime until  Cough is gone for a week without the need for cough suppression Prednisone taper over next week.  GERD diet   Hycodan 1 tsp every 4 hours as needed for cough , may make you sleepy.       03/01/2015  Acute  ov/Luke Wong re: head cold to chest onset was acute  02/25/15  Chief Complaint  Patient presents with  . Acute Visit    pt states he started with just a cold and around sat/ sun it has gotten worse. pt c/o dry cough,SOB. no c/o wheezing or chest tightness, fever or chills.   still has dog in bedroom, had ducts cleaned in house.  Not taking pepcid at hs consistently whenever coughing as prev rec  rec Restart  Pantoprazole (protonix) 40 mg   Take 30-60 min before first meal of the day and Pepcid 20 mg one bedtime until  Cough is gone for a week without the need for cough suppression Prednisone 10 mg take  4 each am x 2 days,   2 each am x 2 days,  1 each am x 2 days and stop  advil  cold / sinus vs just pseudofed  Hycodan 1 tsp every 4 hours as needed for cough , may make you sleepy.  Zpak  GERD diet    04/14/2015  f/u ov/Luke Wong re: recurrent cough  Chief Complaint  Patient presents with  . Acute Visit    Pt c/o cough non prod, chest tightness and SOB for the past 2 days.   gets completely better where no need for any med including no nasal steroids  then relapsed 2 days prior to OV   Cough is worse at hs and present all day long but no excess/ purulent sputum or mucus plugs   rec Restart Pantoprazole (protonix) 40 mg   Take 30-60 min  before first meal of the day and Pepcid 20 mg one bedtime until  Cough is gone for a week without the need for cough suppression and restart immediately at the first sign of any flare  Prednisone 10 mg take  4 each am x 2 days,   2 each am x 2 days,  1 each am x 2 days and stop  Resume nasonex one twice daily as a new maintenance therapy  For nasal congestion >> Advil cold / sinus for nose  For cough >> hycodan 1 tsp every 4 hours as needed   For short of breath > albuterol 2 puff every 4 hours as needed  Please see patient coordinator before you leave today  to schedule methacholine challenge for right after the holidays     04/28/2015 acute extended ov/Luke Wong re: chronic cough  Chief Complaint  Patient presents with  . Acute Visit    pt still c/o chest congestion, chest tightness, shortness of breath, headache.  Denies PND, prod cough.    says having sensation of persistent  chest congestion but cough is shrill / harsh upper airway and day  > noct   No obvious patterns  day to day or daytime variability or cp or   subjective wheeze or   hb symptoms. No unusual exp hx or h/o childhood pna/ asthma or knowledge of premature birth.  Sleeping ok without nocturnal  or early am exacerbation  of respiratory  c/o's or need for noct saba. Also denies any obvious fluctuation of symptoms with weather or environmental changes or other aggravating or alleviating factors except as outlined above   Current Medications, Allergies, Complete Past Medical History, Past Surgical History, Family History, and Social History were reviewed in [employer] record.  ROS  The following are not active complaints unless bolded sore throat, dysphagia, dental problems, itching, sneezing,  nasal congestion or excess/ purulent secretions, ear ache,   fever, chills, sweats, unintended wt loss, classically pleuritic or exertional cp, hemoptysis,  orthopnea pnd or leg swelling, presyncope, palpitations,  abdominal pain, anorexia, nausea, vomiting, diarrhea  or change in bowel or bladder habits, change in stools or urine, dysuria,hematuria,  rash, arthralgias, visual complaints, headache, numbness, weakness or ataxia or problems with walking or coordination,  change in mood/affect or memory.                Objective:   Physical Exam  amb wm still with harsh upper airway cough   04/14/2015      249 > 04/28/2015     250  03/01/2015        244   12/03/14 233 lb (105.688 kg)  11/30/14 232 lb (105.235 kg)  11/22/14 232 lb (105.235 kg)    Vital signs reviewed  HEENT: nl dentition, turbinates, and oropharynx which is pristine. Nl external ear canals without cough reflex/  Tympanic tube in place on R   NECK :  without JVD/Nodes/TM/ nl carotid upstrokes bilaterally   LUNGS: no acc muscle use,  No wheezing or stridor , CTA   CV:  RRR  no s3 or murmur or increase in P2, no edema , neg calf pain, neg homans sign   ABD:  soft and nontender with nl excursion in the supine position. No bruits or organomegaly, bowel sounds nl  MS:  warm without deformities, calf tenderness, cyanosis or clubbing  SKIN: warm and dry without lesions             Assessment & Plan:

## 2015-04-28 NOTE — Telephone Encounter (Signed)
Called spoke with pt. Aware I have sent in RX for the prednisone. Nothing further needed

## 2015-05-02 ENCOUNTER — Encounter: Payer: Self-pay | Admitting: Internal Medicine

## 2015-05-02 NOTE — Assessment & Plan Note (Signed)
-   rx singulair 07/18/2013 > improved 08/21/13 > try off singulair 03/02/14 as pt not convinced helped - Sinus CT 08/28/13 > Sinuses are essentially clear with only minor dependent secretions noted in the inferior right frontal and left maxillary sinuses. There is anatomic narrowing of the ostiomeatal complexes, incompletely evaluated on this limited sinus study. - 02/26/2014 refer to ENT ([facility])> neg sinus dz/ pos ET dysfunction rec flonase  -03/26/14  Allergy profile:  eos 0,  IGgE 87 Dog=cat, dust, ragweed  - 04/19/14  PFTs wnl including fef 25-75 off singulair and all bronchodilators  - NO 04/14/2015 during flare = 28    Refractory cyclical coughing with sensation of "too much throat congestion".  Of the three most common causes of chronic cough, only one (GERD)  can actually cause the other two (asthma and post nasal drip syndrome)  and perpetuate the cylce of cough inducing airway trauma, inflammation, heightened sensitivity to reflux which is prompted by the cough itself via a cyclical mechanism.    This may partially respond to steroids and look like asthma and post nasal drainage but never erradicated completely unless the cough and the secondary reflux are eliminated, preferably both at the same time.  While not intuitively obvious, many patients with chronic low grade reflux do not cough until there is a secondary insult that disturbs the protective epithelial barrier and exposes sensitive nerve endings.  This can be viral or direct physical injury such as with an endotracheal tube.   The point is that once this occurs, it is difficult to eliminate using anything but a maximally effective acid suppression regimen at least in the short run, accompanied by an appropriate diet to address non acid GERD.   rec eliminate pnds/ all acid production and proceed with MCT to make sure this is not asthma and if not refer to [facility]/ Dr Delford FieldWright  I had an extended discussion with the patient  reviewing all relevant studies completed to date and  lasting 15 to 20 minutes of a 25 minute visit    Each maintenance medication was reviewed in detail including most importantly the difference between maintenance and prns and under what circumstances the prns are to be triggered using an action plan format that is not reflected in the computer generated alphabetically organized AVS.    Please see instructions for details which were reviewed in writing and the patient given a copy highlighting the part that I personally wrote and discussed at today's ov.

## 2015-05-06 ENCOUNTER — Ambulatory Visit (HOSPITAL_COMMUNITY)
Admission: RE | Admit: 2015-05-06 | Discharge: 2015-05-06 | Disposition: A | Discharge status: Home / Self Care | Payer: BLUE CROSS/BLUE SHIELD | Source: Ambulatory Visit | Attending: Internal Medicine | Admitting: Internal Medicine

## 2015-05-06 DIAGNOSIS — R058 Other specified cough: Secondary | ICD-10-CM

## 2015-05-06 DIAGNOSIS — R05 Cough: Secondary | ICD-10-CM | POA: Insufficient documentation

## 2015-05-06 LAB — PULMONARY FUNCTION TEST
FEF 25-75 Post: 4.17 L/sec
FEF 25-75 Pre: 5.14 L/sec
FEF2575-%Change-Post: -18 %
FEF2575-%Pred-Post: 106 %
FEF2575-%Pred-Pre: 130 %
FEV1-%Change-Post: -5 %
FEV1-%Pred-Post: 91 %
FEV1-%Pred-Pre: 96 %
FEV1-Post: 3.91 L
FEV1-Pre: 4.14 L
FEV1FVC-%Change-Post: -2 %
FEV1FVC-%Pred-Pre: 108 %
FEV6-%Change-Post: -3 %
FEV6-%Pred-Post: 88 %
FEV6-%Pred-Pre: 91 %
FEV6-Post: 4.67 L
FEV6-Pre: 4.82 L
FEV6FVC-%Pred-Post: 103 %
FEV6FVC-%Pred-Pre: 103 %
FVC-%Change-Post: -3 %
FVC-%Pred-Post: 86 %
FVC-%Pred-Pre: 89 %
FVC-Post: 4.67 L
FVC-Pre: 4.82 L
Post FEV1/FVC ratio: 84 %
Post FEV6/FVC ratio: 100 %
Pre FEV1/FVC ratio: 86 %
Pre FEV6/FVC Ratio: 100 %

## 2015-05-06 MED ORDER — METHACHOLINE 0.0625 MG/ML NEB SOLN
2.0000 mL | Freq: Once | RESPIRATORY_TRACT | Status: AC
  Administered 2015-05-06: 0.125 mg via RESPIRATORY_TRACT

## 2015-05-06 MED ORDER — SODIUM CHLORIDE 0.9 % IN NEBU
3.0000 mL | INHALATION_SOLUTION | Freq: Once | RESPIRATORY_TRACT | Status: AC
  Administered 2015-05-06: 3 mL via RESPIRATORY_TRACT

## 2015-05-06 MED ORDER — METHACHOLINE 16 MG/ML NEB SOLN
2.0000 mL | Freq: Once | RESPIRATORY_TRACT | Status: AC
  Administered 2015-05-06: 32 mg via RESPIRATORY_TRACT

## 2015-05-06 MED ORDER — METHACHOLINE 0.25 MG/ML NEB SOLN
2.0000 mL | Freq: Once | RESPIRATORY_TRACT | Status: AC
  Administered 2015-05-06: 0.5 mg via RESPIRATORY_TRACT

## 2015-05-06 MED ORDER — ALBUTEROL SULFATE (2.5 MG/3ML) 0.083% IN NEBU
2.5000 mg | INHALATION_SOLUTION | Freq: Once | RESPIRATORY_TRACT | Status: AC
  Administered 2015-05-06: 2.5 mg via RESPIRATORY_TRACT

## 2015-05-06 MED ORDER — METHACHOLINE 4 MG/ML NEB SOLN
2.0000 mL | Freq: Once | RESPIRATORY_TRACT | Status: AC
  Administered 2015-05-06: 8 mg via RESPIRATORY_TRACT

## 2015-05-06 MED ORDER — METHACHOLINE 1 MG/ML NEB SOLN
2.0000 mL | Freq: Once | RESPIRATORY_TRACT | Status: AC
  Administered 2015-05-06: 2 mg via RESPIRATORY_TRACT

## 2015-05-09 ENCOUNTER — Other Ambulatory Visit: Payer: Self-pay | Admitting: *Deleted

## 2015-05-09 MED ORDER — ATORVASTATIN CALCIUM 40 MG PO TABS: ORAL_TABLET | ORAL | Status: DC

## 2015-05-09 NOTE — Progress Notes (Signed)
Quick Note:  Spoke with pt and notified of results per Dr. Wert. Pt verbalized understanding and denied any questions.  ______ 

## 2015-05-27 ENCOUNTER — Encounter: Payer: Self-pay | Admitting: Internal Medicine

## 2015-05-27 ENCOUNTER — Ambulatory Visit (INDEPENDENT_AMBULATORY_CARE_PROVIDER_SITE_OTHER): Payer: BLUE CROSS/BLUE SHIELD | Admitting: Internal Medicine

## 2015-05-27 VITALS — BP 152/98 | HR 89 | Ht 72.0 in | Wt 258.8 lb

## 2015-05-27 DIAGNOSIS — H6983 Other specified disorders of Eustachian tube, bilateral: Secondary | ICD-10-CM | POA: Diagnosis not present

## 2015-05-27 DIAGNOSIS — R05 Cough: Secondary | ICD-10-CM | POA: Diagnosis not present

## 2015-05-27 DIAGNOSIS — R058 Other specified cough: Secondary | ICD-10-CM

## 2015-05-27 MED ORDER — HYDROCODONE-HOMATROPINE 5-1.5 MG/5ML PO SYRP: 5.0000 mL | ORAL_SOLUTION | Freq: Four times a day (QID) | ORAL | Status: DC | PRN

## 2015-05-27 NOTE — Progress Notes (Signed)
Subjective:    Patient ID: Luke Wong, male    DOB: 1970-09-19  MRN: [id number]    Brief patient profile:  44 yowm never smoker/attorney freq commercial flyer with childhood pna/ asthma mostly eia stopped needing inhaler age 45 but started needing again around age of 69 mainly for coughing with increased for albuterol use  then back to before exercise but not always.  Prev allergy eval in [place] Pos cats> dogs (n/a for him) referred 07/17/2013 by Dr Duane Lope for cough eval.   History of Present Illness  07/17/2013 1st [facility] office visit/ Luke Wong  Chief Complaint  Patient presents with  . Pulmonary Consult    Referred per Dr. Sigmund Hazel. Pt c/o persistant cough for the past 3 years, but worse for the past year. He also c/o DOE with minimal exertion.    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   qvar not helping - already rx with zpak and prednisone.  Only thing that really knocks it out is tussionex.  Sob mostly just with coughing. Cough tends to be worse after supper and disturbs sleep, never coughs up anything much at all rec Prednisone 10 mg take  4 each am x 2 days,   2 each am x 2 days,  1 each am x 2 days and stop  Take delsym two tsp every 12 hours and supplement if needed with  tramadol 50 mg up to 2 every 4 hours to suppress the urge to cough.  . Once you have eliminated the cough for 3 straight days try reducing the tramadol first,  then the delsym as tolerated.   Pantoprazole (protonix) 40 mg   Take 30-60 min before first meal of the day and Pepcid 20 mg one bedtime singulair 10 mg daily   08/21/2013 f/u ov/Luke Wong re: recurrent cough/  Chief Complaint  Patient presents with  . Follow-up    Pt states that his cough has almost resolved. His breathing has improved some, but "sometimes  feel like I am breathing under water".  Has not had to use rescue inhaler since his last visit.   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 asthma Chief Complaint  Patient presents with  . Acute Visit    Pt c/o chest congestion and cough for the past 6 days- started out with ear pain and his PCP gave zithromax.  Cough is non prod.   did not activate action plan. Feels cough rattling in chest but not productive. Using clariton for nasal/ ear congestion s benefit  and no better p zpak rec Take delsym two tsp every 12 hours and supplement if needed with tylenol #3 every 4 hours to suppress the urge to cough.   Augmentin 875 mg take one pill twice daily  X 10 days - take at breakfast and supper with large glass of water.  It would help reduce the usual side effects (diarrhea and yeast infections) if you ate cultured yogurt at lunch.  Prednisone 10 mg take  4 each am x 2 days,   2 each am x 2 days,  1 each am x 2 days and stop  Ear congestion > clariton D over the counter Plan A = Automatic = singulair 10 mg one daily until return  Plan B = Backup  Short or wheeze > albuterol up 2pff every 4 hours if needed Cough > delsym/ tylenol #3 1 every 4 hours Plan C = contingency = whenever you are having a respiratory flare:  Pantoprazole (protonix) 40 mg   Take  30-60 min before first meal of the day and Pepcid 20 mg one bedtime until 100% better    02/26/2014 f/u ov/Luke Wong re: cough  On singulair for chronic rhinitis and ? Asthma plus gerd rx  Chief Complaint  Patient presents with  . Follow-up    Pt states that his cough has improved some.  He is still having popping in ear and diff with hearing, although this has also improved some.   cough was worse after supper until bed and up all night coughing > better now  Last well 4 weeks prior to OV   Prednisone works the best  Severe ear popping and pain when flies, esp on R  Doesn't think singulair helping  No need for saba  >>pred paper    03/23/2014 NP Acute OV  Patient returns for persistent coughing.  Complains of worsening cough that is more frequent and deeper x6 days. Ooccasionally produces mucus, wheezing, tightness/heaviness, HA, some low grade temp over the weekend while out of town.   Saw ENT and was started on pred, flonase and will followup again 12/7. Gets some better but cough never goes away completely.  Last antibiotic  was 1 month ago He denies any chest pain, orthopnea, PND, hemoptysis, edema, nausea, vomiting overt reflux, or leg swelling. Has been using several things for cough control, including Delsym Hydromet, Tylenol 3 and tramadol. Says that Hydromet works the best for his cough Has had several courses of steroids over the last year We discussed the potential side effects of frequent steroid use and frequent narcotic use. Cough is disrupting his home life and working environment Patient is a  Clinical research associate.  He is a never smoker. Denies any unusual hobbies or pets. Does travel frequently to [place]. Has 1 dog. Lives in a rental home w/ no carpet.  rec Return for labs and chest x-ray Begin Delsym 2 teaspoons twice daily Begin Tessalon perles 1 Three times a day   May use Hydromet 1-2 teaspoons every 6 hours as needed for cough control, may make you sleepy Begin Chlortrimeton   4 mg 2 at bedtime May use Chlor-Trimeton 4 mg 1 every 4 hours as needed. For postnasal drip. Throat clearing and drainage, may make you sleepy Continue with Protonix daily before meal Continue with Pepcid at bedti   ENT > R Tympanic tube early Dec 2015    04/19/2014 f/u ov/Luke Wong re: recurrent cough x 2 months  Chief Complaint  Patient presents with  . Follow-up  PFT done today. Pt states that his cough is some better.  No new co's.   cough worse as day goes on at peaks at hs despite max gerd rx Coughed up some green last week  Not limited by breathing from desired activities   Not convinced resp to saba or worse off singulair  >>PPI/Pepcid/pred pack and hycodan rx   07/26/2014 NP Acute OV  Presents for an acute office visit.  Complains of cough for last 4 days. Minimally productive.  No fever, discolored mucus , chest pain, orthopnea, n/v/d.  Seen last ov for chronic cough , tx w/ steroids , PPI/Pepcid and chlortrimeton . Says cough went totally away for last 3 months . Feels he caught a cold last week and cough started back . He restarted protonix . Does not have any cough syrup.  Had recent ankle surgery and trip [place]. No calf pain, chest pain or hemoptysis.  He is leaving to go [place] for work in 3 days. Wants to get  Better before leaving.  Previous PFT and cxr nml .  No sign sinus congestion.  rec Restart  Pantoprazole (protonix) 40 mg   Take 30-60 min before first meal of the day and Pepcid 20 mg one bedtime until return to office - this is the best way to tell whether stomach acid is contributing to your problem.   Prednisone taper over next week.  Delsym 2 tsp Twice daily  As needed  Cough .  For drainage take chlortrimeton (chlorpheniramine) 4 mg every 4 hours available over the counter (may cause drowsiness)  Hycodan 1 tsp every 4 hours as needed for cough , may make you sleepy.     12/03/2014 acute  ov/Luke Wong re: acute flare of uacs p et  Chief Complaint  Patient presents  with  . Acute Visit    Pt c/o increased cough for the past 3 days- had back surgery the day before this started. He also c/o minimal chest tightness.    Was doing fine then ET 12/01/14 for back surgery  then 8/11 onset hacking cough/ sob not on acid rx / dry hacking quality to point hard to catch his breath but otherwise really not sob  rec Restart  Pantoprazole (protonix) 40 mg   Take 30-60 min before first meal of the day and Pepcid 20 mg one bedtime until  Cough is gone for a week without the need for cough suppression Prednisone taper over next week.  GERD diet   Hycodan 1 tsp every 4 hours as needed for cough , may make you sleepy.       03/01/2015  Acute  ov/Luke Wong re: head cold to chest onset was acute  02/25/15  Chief Complaint  Patient presents with  . Acute Visit    pt states he started with just a cold and around sat/ sun it has gotten worse. pt c/o dry cough,SOB. no c/o wheezing or chest tightness, fever or chills.   still has dog in bedroom, had ducts cleaned in house.  Not taking pepcid at hs consistently whenever coughing as prev rec  rec Restart  Pantoprazole (protonix) 40 mg   Take 30-60 min before first meal of the day and Pepcid 20 mg one bedtime until  Cough is gone for a week without the need for cough suppression Prednisone 10 mg take  4 each am x 2 days,   2 each am x 2 days,  1 each am x 2 days and stop  advil  cold / sinus vs just pseudofed  Hycodan 1 tsp every 4 hours as needed for cough , may make you sleepy.  Zpak  GERD diet    04/14/2015  f/u ov/Luke Wong re: recurrent cough  Chief Complaint  Patient presents with  . Acute Visit    Pt c/o cough non prod, chest tightness and SOB for the past 2 days.   gets completely better where no need for any med including no nasal steroids  then relapsed 2 days prior to OV   Cough is worse at hs and present all day long but no excess/ purulent sputum or mucus plugs   rec Restart Pantoprazole (protonix) 40 mg   Take 30-60 min  before first meal of the day and Pepcid 20 mg one bedtime until  Cough is gone for a week without the need for cough suppression and restart immediately at the first sign of any flare  Prednisone 10 mg take  4 each am x 2 days,   2 each am x 2 days,  1 each am x 2 days and stop  Resume nasonex one twice daily as a new maintenance therapy  For nasal congestion >> Advil cold / sinus for nose  For cough >> hycodan 1 tsp every 4 hours as needed   For short of breath > albuterol 2 puff every 4 hours as needed  Please see patient coordinator before you leave today  to schedule methacholine challenge for right after the holidays     04/28/2015 acute extended ov/Luke Wong re: chronic cough  Chief Complaint  Patient presents with  . Acute Visit    pt still c/o chest congestion, chest tightness, shortness of breath, headache.  Denies PND, prod cough.    says having sensation of persistent  chest congestion but cough is shrill / harsh upper airway and day  > noct  rec Goal is to elminate completely all coughing while on prednisone and continuing the full acid suppression regimen  Stay on the acid regimen along with the nasal spray  until the methacholine choline challenge > neg  For drainage / throat tickle try take CHLORPHENIRAMINE  4 mg - Prednisone 10 mg take  4 each am x 2 days,   2 each am x 2 days,  1 each am x 2 days and stop  Hycodan 2 tsp every 3-4 hours x 3-5 days and stop     05/27/2015  f/u ov/Luke Wong re: UACS / MCT neg on gabapentin 300 avg 3 twice daily ? Really taking regularly  Chief Complaint  Patient presents with  . Follow-up    Pt reports that his cough has improved. Pt c/o sore throat x1 week, bilateral ear pain and occasional dry cough. Pt denies SOB/CP/tightness/sinus congestion.   was better p last ov but really only while on narcs. No obvious patterns  day to day or daytime variability or c sob p or   subjective wheeze or   hb symptoms. No unusual exp hx or h/o childhood pna/ asthma  or knowledge of premature birth. Feels worse hs   Sleeping ok without nocturnal  or early am exacerbation  of respiratory  c/o's or need for noct saba. Also denies any obvious fluctuation of symptoms with weather or environmental changes or other aggravating or alleviating factors except as outlined above   Current Medications, Allergies, Complete Past Medical History, Past Surgical History, Family History, and Social History were reviewed in [employer] record.  ROS  The following  are not active complaints unless bolded sore throat, dysphagia, dental problems, itching, sneezing,  nasal congestion or excess/ purulent secretions, bilateral ear ache,   fever, chills, sweats, unintended wt loss, classically pleuritic or exertional cp, hemoptysis,  orthopnea pnd or leg swelling, presyncope, palpitations, abdominal pain, anorexia, nausea, vomiting, diarrhea  or change in bowel or bladder habits, change in stools or urine, dysuria,hematuria,  rash, arthralgias, visual complaints, headache, numbness, weakness or ataxia or problems with walking or coordination,  change in mood/affect or memory.                Objective:   Physical Exam  amb wm still with harsh upper airway cough but less freq than prev visits   04/14/2015      249 > 04/28/2015     250 > 05/27/2015 259  03/01/2015        244   12/03/14 233 lb (105.688 kg)  11/30/14 232 lb (105.235 kg)  11/22/14 232 lb (105.235 kg)    Vital signs reviewed      HEENT: nl dentition, turbinates, and oropharynx which is pristine. Nl external ear canals without cough reflex/  Tympanic tube in place on R   NECK :  without JVD/Nodes/TM/ nl carotid upstrokes bilaterally   LUNGS: no acc muscle use,  No wheezing or stridor , CTA   CV:  RRR  no s3 or murmur or increase in P2, no edema , neg calf pain, neg homans sign   ABD:  soft and nontender with nl excursion in the supine position. No bruits or organomegaly, bowel sounds  nl  MS:  warm without deformities, calf tenderness, cyanosis or clubbing  SKIN: warm and dry without lesions             Assessment & Plan:

## 2015-05-27 NOTE — Patient Instructions (Addendum)
For drainage / throat tickle try take CHLORPHENIRAMINE  4 mg - take one every 4 hours as needed - available over the counter- may cause drowsiness so start with just a bedtime dose or two and see how you tolerate it before trying in daytime    For cough > hycodan 2 tsp every 4 hours as needed   See Luke Wong and ask him about Luke Wong at [facility] or I can refer to Luke Wong in allergy   No pulmonary f/u planned

## 2015-05-29 ENCOUNTER — Encounter: Payer: Self-pay | Admitting: Internal Medicine

## 2015-05-29 NOTE — Assessment & Plan Note (Signed)
See ent eval by Suszanne Conners 03/08/14  rec flonase and R Tympanic tube   Appears to be in good position on R with no acute findings to explain earache ? Referred from sinus Dx

## 2015-05-29 NOTE — Assessment & Plan Note (Signed)
-   rx singulair 07/18/2013 > improved 08/21/13 > try off singulair 03/02/14 as pt not convinced helped - Sinus CT 08/28/13 > Sinuses are essentially clear with only minor dependent secretions noted in the inferior right frontal and left maxillary sinuses. There is anatomic narrowing of the ostiomeatal complexes, incompletely evaluated on this limited sinus study. - 02/26/2014 refer to ENT ([facility])> neg sinus dz/ pos ET dysfunction rec flonase  -03/26/14  Allergy profile:  eos 0,  IGgE 87 Dog=cat, dust, ragweed  - 04/19/14  PFTs wnl including fef 25-75 off singulair and all bronchodilators  - NO 04/14/2015 during flare = 28  - Methacholine challenge 05/05/14 > neg for asthma   Still has dog in bedroom but denies ever better when travels and does so frequently  I had an extended final summary discussion with the patient reviewing all relevant studies completed to date and  lasting 15 to 20 minutes of a 25 minute visit on the following issues:    In absence of Pos MCT we have nothing else to offer in this clinic as the cough is not pulmonary in origin.  Options are refer to Dr Delford Field at [facility] if Dr Suszanne Conners has reached the end of what he can offer vs get a second opinion on role of allergy vs gerd from Dr Lucie Leather   Either way pulmonary f/u is prn  Each maintenance medication was reviewed in detail including most importantly the difference between maintenance and as needed and under what circumstances the prns are to be used.  Please see instructions for details which were reviewed in writing and the patient given a copy.

## 2015-06-09 ENCOUNTER — Encounter: Payer: Self-pay | Admitting: Internal Medicine

## 2015-06-17 ENCOUNTER — Ambulatory Visit (INDEPENDENT_AMBULATORY_CARE_PROVIDER_SITE_OTHER): Payer: BLUE CROSS/BLUE SHIELD | Admitting: Internal Medicine

## 2015-06-17 ENCOUNTER — Encounter: Payer: Self-pay | Admitting: Internal Medicine

## 2015-06-17 VITALS — BP 144/92 | HR 90 | Ht 72.0 in | Wt 254.0 lb

## 2015-06-17 DIAGNOSIS — E785 Hyperlipidemia, unspecified: Secondary | ICD-10-CM | POA: Diagnosis not present

## 2015-06-17 MED ORDER — FENOFIBRATE 145 MG PO TABS: 145.0000 mg | ORAL_TABLET | Freq: Every day | ORAL | Status: DC

## 2015-06-17 MED ORDER — ATORVASTATIN CALCIUM 40 MG PO TABS: ORAL_TABLET | ORAL | Status: DC

## 2015-06-17 NOTE — Progress Notes (Signed)
Cardiology Office Note   Date:  06/17/2015   ID:  Luke Wong, DOB 25-Aug-1970, MRN [id number]  PCP:   Melinda Crutch, MD  Cardiologist:   Dorris Carnes, MD    F/U of hyperlipidemia     History of Present Illness: Luke Wong is a 45 y.o. male with a history of hyperlipidemia and a very strong family history of CAD (no male lived after 49 due to heart problems) I saw him last in Aug 2015.  Note that CT Ca score was ) in past Since I saw him he notes rare CP  Not associated with activity  Lasts about a 1 to 5 min  Again not with activity  No SOB  Eases on own He is not as active as he has been  Now s/p back and ankle surgery  Still with probmes   Has met with trainer Wants to get in an exercise routine        Outpatient Prescriptions Prior to Visit  Medication Sig Dispense Refill  . aspirin 81 MG tablet Take 81 mg by mouth daily.    Marland Kitchen atorvastatin (LIPITOR) 40 MG tablet TAKE 1 TABLET (40 MG TOTAL) BY MOUTH DAILY. 16 tablet 0  . cetirizine (ZYRTEC) 10 MG tablet Take 10 mg by mouth daily.    . DULoxetine (CYMBALTA) 60 MG capsule Take 60 mg by mouth daily.     . famotidine (PEPCID) 20 MG tablet One at bedtime  As long as coughing 30 tablet 2  . fenofibrate (TRICOR) 145 MG tablet Take 1 tablet (145 mg total) by mouth daily. 16 tablet 0  . finasteride (PROPECIA) 1 MG tablet Take 1 mg by mouth daily.    Marland Kitchen albuterol (PROAIR HFA) 108 (90 BASE) MCG/ACT inhaler Inhale 2 puffs into the lungs every 6 (six) hours as needed for wheezing or shortness of breath. Reported on 06/17/2015    . HYDROcodone-homatropine (HYCODAN) 5-1.5 MG/5ML syrup Take 5 mLs by mouth every 6 (six) hours as needed for cough. (Patient not taking: Reported on 06/17/2015) 473 mL 0  . mometasone (NASONEX) 50 MCG/ACT nasal spray One twice daily each nostril 17 g 11  . Multiple Vitamins-Minerals (MULTIVITAMIN PO) Take 1 tablet by mouth daily.    . pantoprazole (PROTONIX) 40 MG tablet Take 30-60 min before first meal of the day as long  as coughing 30 tablet 11  . predniSONE (DELTASONE) 10 MG tablet take 4 each am x 2 days,  2 each am x 2 days,  1 each am x 2 days and stop 14 tablet 0   No facility-administered medications prior to visit.     Allergies:   Demerol   Past Medical History  Diagnosis Date  . Asthma   . Hyperlipidemia   . GERD (gastroesophageal reflux disease)   . Pneumonia 2015  . Depression     Past Surgical History  Procedure Laterality Date  . Ankle surgery Left 06/07/13, 3/16    Arthroscopic debridement  x2  . Hernia repair Bilateral 1994  . Lumbar laminectomy/ decompression with met-rx Right 11/30/2014    Procedure: Right L4-5 Far Lateral Microdiskectomy w/ Metrx;  Surgeon: Kevan Ny Ditty, MD;  Location: [facility];  Service: Neurosurgery;  Laterality: Right;  Right L4-5 Far Lateral Microdiskectomy     Social History:  The patient  reports that he has never smoked. He has never used smokeless tobacco. He reports that he drinks alcohol. He reports that he does not use illicit drugs.  Family History:  The patient's family history includes Heart attack in his father and paternal grandfather; Heart disease in his father; Thyroid cancer in his mother.    ROS:  Please see the history of present illness. All other systems are reviewed and  Negative to the above problem except as noted.    PHYSICAL EXAM: VS:  BP 144/92 mmHg  Pulse 90  Ht 6' (1.829 m)  Wt 254 lb (115.214 kg)  BMI 34.44 kg/m2  On my check BP R arm 140/90; L arm 134/92   GEN: Well nourished, well developed, in no acute distress HEENT: normal Neck: no JVD, carotid bruits, or masses Cardiac: RRR; no murmurs, rubs, or gallops,no edema  Respiratory:  clear to auscultation bilaterally, normal work of breathing GI: soft, nontender, nondistended, + BS  No hepatomegaly  MS: no deformity Moving all extremities   Skin: warm and dry, no rash Neuro:  Strength and sensation are intact Psych: euthymic mood, full affect   EKG:   EKG is  ordered today.  SR 92 bpm   Lipid Panel    Component Value Date/Time   CHOL 174 09/16/2014 0814   TRIG 210.0* 09/16/2014 0814   HDL 34.60* 09/16/2014 0814   CHOLHDL 5 09/16/2014 0814   VLDL 42.0* 09/16/2014 0814   LDLDIRECT 113.0 09/16/2014 0814      Wt Readings from Last 3 Encounters:  06/17/15 254 lb (115.214 kg)  05/27/15 258 lb 12.8 oz (117.391 kg)  04/28/15 250 lb (113.399 kg)      ASSESSMENT AND PLAN:   1.  HL  He has run out of statin and tricor several days ago WIll set up for fasting lipids next wk COnsideration for Zetia once prices drop  2.  HTN  BP is labile  I have reviewed notes and BP is up and down.  I would recomm that he come in with his own BP cuf when he comes in for lipids  He needs to track at home  May need Rx   Signed, Dorris Carnes, MD  06/17/2015 4:21 PM    [address], [place], [place]  [address] Phone: [phone]; Fax: [phone]

## 2015-06-17 NOTE — Patient Instructions (Signed)
Your physician recommends that you continue on your current medications as directed. Please refer to the Current Medication list given to you today.  Your physician recommends that you return for lab work in: next week.  (LIPIDS, CBC)  Please call the day before to make an appointment.   Follow up with your physician will depend on test results.

## 2015-06-23 ENCOUNTER — Telehealth: Payer: Self-pay | Admitting: *Deleted

## 2015-06-23 ENCOUNTER — Other Ambulatory Visit (INDEPENDENT_AMBULATORY_CARE_PROVIDER_SITE_OTHER): Payer: BLUE CROSS/BLUE SHIELD | Admitting: *Deleted

## 2015-06-23 DIAGNOSIS — E785 Hyperlipidemia, unspecified: Secondary | ICD-10-CM | POA: Diagnosis not present

## 2015-06-23 LAB — CBC
HCT: 43.4 % (ref 39.0–52.0)
Hemoglobin: 14.5 g/dL (ref 13.0–17.0)
MCH: 27.9 pg (ref 26.0–34.0)
MCHC: 33.4 g/dL (ref 30.0–36.0)
MCV: 83.6 fL (ref 78.0–100.0)
MPV: 8.7 fL (ref 8.6–12.4)
Platelets: 333 10*3/uL (ref 150–400)
RBC: 5.19 MIL/uL (ref 4.22–5.81)
RDW: 13.5 % (ref 11.5–15.5)
WBC: 8 10*3/uL (ref 4.0–10.5)

## 2015-06-23 LAB — LIPID PANEL
Cholesterol: 282 mg/dL — ABNORMAL HIGH (ref 125–200)
HDL: 34 mg/dL — ABNORMAL LOW (ref >=40)
Total CHOL/HDL Ratio: 8.3 Ratio — ABNORMAL HIGH (ref <=5.0)
Triglycerides: 740 mg/dL — ABNORMAL HIGH (ref <150)

## 2015-06-23 NOTE — Telephone Encounter (Signed)
Patient came to have labs drawn.---he has been out of both cholesterol medications for approx 10 days. BP checked while he was here, along with checking it with his own cuff.  My first check was 132/86 He checked x3 with his cuff: 142/99, 142/95, 144/98  I did a final check 140/100.  Reviewed and updated patient's medication list with him. Advised him to continue to monitor and record blood pressures, send through MyChart for Dr. Tenny Craw to review.

## 2015-06-27 ENCOUNTER — Other Ambulatory Visit: Payer: Self-pay | Admitting: *Deleted

## 2015-06-27 ENCOUNTER — Telehealth: Payer: Self-pay | Admitting: *Deleted

## 2015-06-27 DIAGNOSIS — E785 Hyperlipidemia, unspecified: Secondary | ICD-10-CM

## 2015-06-27 MED ORDER — TRIAMTERENE-HCTZ 37.5-25 MG PO TABS: 0.5000 | ORAL_TABLET | Freq: Every day | ORAL | Status: DC

## 2015-06-27 MED ORDER — ATORVASTATIN CALCIUM 40 MG PO TABS: 40.0000 mg | ORAL_TABLET | Freq: Every day | ORAL | Status: DC

## 2015-06-27 MED ORDER — ATORVASTATIN CALCIUM 40 MG PO TABS: ORAL_TABLET | ORAL | Status: DC

## 2015-06-27 MED ORDER — FENOFIBRATE 145 MG PO TABS: 145.0000 mg | ORAL_TABLET | Freq: Every day | ORAL | Status: DC

## 2015-06-27 NOTE — Telephone Encounter (Signed)
Patient called and stated there was supposed to be an rx for a new medication sent in on Friday. He did not know the name of the medication only that it started with an "M". I see that meloxicam was put on his list as a historical med but not ordered, but he stated that was not the medication that he was calling about. He needs a fourteen day supply sent to [facility] and a ninety day to [facility].

## 2015-06-27 NOTE — Telephone Encounter (Signed)
Notes Recorded by Pricilla RifflePaula Ross V, MD on 06/24/2015 at 5:39 PM Spoke to pt 1. Would restart Tricor and lipitor at previous doses. Call in 1 month to [facility] and the rest as mail away Will need lipid panel and Hgb A1C and AST in 8 wks  2 With BP being high would recomm starting Maxzide 37.5/25 mg Cut in 1/2  Start taking and follow BP Email bp readings through my chart over the next couple wks

## 2015-06-27 NOTE — Telephone Encounter (Signed)
14 DAY SUPPLY SENT TO [facility] FOR TRICOR AND LIPITOR AND MAXZIDE.  90 DAY SUPPLY SENT TO [facility] FOR ABOVE.  LAB ORDERS PLACED FOR IN 8 WEEKS FOR AST, LIPIDS, HGA1C.  PT IS TO EMAIL BLOOD PRESSURE READINGS.

## 2015-07-04 ENCOUNTER — Other Ambulatory Visit: Payer: Self-pay

## 2015-07-04 MED ORDER — ATORVASTATIN CALCIUM 40 MG PO TABS: 40.0000 mg | ORAL_TABLET | Freq: Every day | ORAL | Status: DC

## 2015-07-04 MED ORDER — FENOFIBRATE 145 MG PO TABS: 145.0000 mg | ORAL_TABLET | Freq: Every day | ORAL | Status: DC

## 2015-07-04 NOTE — Telephone Encounter (Signed)
[facility] sent a fax stating that the prescriptions for Lipitor and Tricor were sent there in error and should have been sent to [facility]. Error corrected.    Lendon KaMichalene Wilson, RN at 06/27/2015 2:42 PM     Status: Signed       Expand All Collapse All   14 DAY SUPPLY SENT TO [facility] FOR TRICOR AND LIPITOR AND MAXZIDE.  90 DAY SUPPLY SENT TO [facility] FOR ABOVE.  LAB ORDERS PLACED FOR IN 8 WEEKS FOR AST, LIPIDS, HGA1C.  PT IS TO EMAIL BLOOD PRESSURE READINGS.

## 2015-07-11 ENCOUNTER — Other Ambulatory Visit: Payer: Self-pay | Admitting: *Deleted

## 2015-07-11 ENCOUNTER — Other Ambulatory Visit: Payer: Self-pay | Admitting: Cardiology

## 2015-07-12 ENCOUNTER — Other Ambulatory Visit: Payer: Self-pay | Admitting: *Deleted

## 2015-07-12 MED ORDER — TRIAMTERENE-HCTZ 37.5-25 MG PO TABS: 0.5000 | ORAL_TABLET | Freq: Every day | ORAL | Status: DC

## 2015-07-15 ENCOUNTER — Ambulatory Visit (INDEPENDENT_AMBULATORY_CARE_PROVIDER_SITE_OTHER): Payer: BLUE CROSS/BLUE SHIELD

## 2015-07-15 ENCOUNTER — Ambulatory Visit (INDEPENDENT_AMBULATORY_CARE_PROVIDER_SITE_OTHER): Payer: BLUE CROSS/BLUE SHIELD | Admitting: Podiatry

## 2015-07-15 ENCOUNTER — Encounter: Payer: Self-pay | Admitting: Podiatry

## 2015-07-15 DIAGNOSIS — T148 Other injury of unspecified body region: Secondary | ICD-10-CM

## 2015-07-15 DIAGNOSIS — R52 Pain, unspecified: Secondary | ICD-10-CM | POA: Diagnosis not present

## 2015-07-15 DIAGNOSIS — G8929 Other chronic pain: Secondary | ICD-10-CM

## 2015-07-15 DIAGNOSIS — T148XXA Other injury of unspecified body region, initial encounter: Secondary | ICD-10-CM

## 2015-07-15 DIAGNOSIS — M25572 Pain in left ankle and joints of left foot: Secondary | ICD-10-CM | POA: Diagnosis not present

## 2015-07-15 NOTE — Progress Notes (Signed)
   Subjective:    Patient ID: Luke Wong, male    DOB: 1971/01/07, 45 y.o.   MRN: [id number]  HPI  45 year old male presents the also for concerns of left ankle pain. He states that 3 years ago he had an injury for which describes an inversion type injury. At that time he had x-rays performed which was negative for fracturing is told he had a sprain. He has previous undergone 2 ankle arthroscopies by Dr. Lajoyce Cornersuda he states that this gave some relief although he does continue to have the same pain that he had prior to the surgery. Also he has had back surgery and let us and numbness in the right leg. He has purchased an over-the-counter insert is not help. He has pain to his left ankle on daily basis. He is also been seen by anesthesia to rule out CRPS and he was told this was ruled out and he does not have it..  No other complaints at this time.   Review of Systems  All other systems reviewed and are negative.      Objective:   Physical Exam General: AAO x3, NAD  Dermatological: Skin is warm, dry and supple bilateral. Nails x 10 are well manicured; remaining integument appears unremarkable at this time. There are no open sores, no preulcerative lesions, no rash or signs of infection present. Scars present from the prior ankle surgery which are well-healed. There is no excessive sweating or trying or swelling to the foot. There is no skin discoloration. He is no clinical signs of CRPS at this time.  Vascular: Dorsalis Pedis artery and Posterior Tibial artery pedal pulses are 2/4 bilateral with immedate capillary fill time. Pedal hair growth present. No varicosities and no lower extremity edema present bilateral. There is no pain with calf compression, swelling, warmth, erythema.   Neruologic: They're somewhat decreased sensation on the right side however overall sensation left side appears to be intact. Achilles tendon reflex intact.   Musculoskeletal: There is tenderness palpation along the course  the ATFL on the left ankle. There is localized edema overlying the ATFL. There is no pain on the residual ankle ligaments. There is no pain with ankle or subtalar joint range of motion is no crepitation or restriction. There is a decrease in medial arch height upon weightbearing left side worse in the right. Mild equinus is present. MMT 5/5.  Gait: Unassisted, Nonantalgic.       Assessment & Plan:  45 year old male left ankle pain, likely chronic ATFL sprain, tear -Treatment options discussed including all alternatives, risks, and complications -X-rays were obtained and reviewed with the patient.  -I long discussion with the patient reports his symptoms. I pleased that his symptoms are likely due to ankle instability and chronic tear the ATFL from the sprain he had. The majority of symptoms. He localizes overlying this area. -He states he had 2 MRIs. He will email me copies of the MRI and I will review them. -For now continue with ankle brace. Discussed orthotics. -Upon my evaluation today, his pain appears to be more due to ATFL. I discussed with him ankle stabilization and ATFL repair. -Follow-up after review the images or sooner if any changes are to occur. Call any questions or concerns.  Ovid CurdMatthew Shon Indelicato, DPM

## 2015-07-20 ENCOUNTER — Telehealth: Payer: Self-pay | Admitting: *Deleted

## 2015-07-21 NOTE — Telephone Encounter (Signed)
Left message informing pt, that our office was unable to open the dropbox containing his MRI, to please bring disc and report to the office.

## 2015-08-03 DIAGNOSIS — M5416 Radiculopathy, lumbar region: Secondary | ICD-10-CM | POA: Diagnosis not present

## 2015-08-03 DIAGNOSIS — M545 Low back pain: Secondary | ICD-10-CM | POA: Diagnosis not present

## 2015-08-03 DIAGNOSIS — I10 Essential (primary) hypertension: Secondary | ICD-10-CM | POA: Diagnosis not present

## 2015-08-03 DIAGNOSIS — Z6834 Body mass index (BMI) 34.0-34.9, adult: Secondary | ICD-10-CM | POA: Diagnosis not present

## 2015-08-08 DIAGNOSIS — M545 Low back pain: Secondary | ICD-10-CM | POA: Diagnosis not present

## 2015-08-08 DIAGNOSIS — M25572 Pain in left ankle and joints of left foot: Secondary | ICD-10-CM | POA: Diagnosis not present

## 2015-08-15 DIAGNOSIS — M545 Low back pain: Secondary | ICD-10-CM | POA: Diagnosis not present

## 2015-08-15 DIAGNOSIS — M25572 Pain in left ankle and joints of left foot: Secondary | ICD-10-CM | POA: Diagnosis not present

## 2015-08-22 DIAGNOSIS — M25572 Pain in left ankle and joints of left foot: Secondary | ICD-10-CM | POA: Diagnosis not present

## 2015-08-22 DIAGNOSIS — M545 Low back pain: Secondary | ICD-10-CM | POA: Diagnosis not present

## 2015-08-26 DIAGNOSIS — Z6833 Body mass index (BMI) 33.0-33.9, adult: Secondary | ICD-10-CM | POA: Diagnosis not present

## 2015-08-26 DIAGNOSIS — I10 Essential (primary) hypertension: Secondary | ICD-10-CM | POA: Diagnosis not present

## 2015-08-26 DIAGNOSIS — M5416 Radiculopathy, lumbar region: Secondary | ICD-10-CM | POA: Diagnosis not present

## 2015-08-26 DIAGNOSIS — M545 Low back pain: Secondary | ICD-10-CM | POA: Diagnosis not present

## 2015-08-30 DIAGNOSIS — F321 Major depressive disorder, single episode, moderate: Secondary | ICD-10-CM | POA: Insufficient documentation

## 2015-08-30 DIAGNOSIS — E7800 Pure hypercholesterolemia, unspecified: Secondary | ICD-10-CM | POA: Insufficient documentation

## 2015-08-30 DIAGNOSIS — I10 Essential (primary) hypertension: Secondary | ICD-10-CM | POA: Insufficient documentation

## 2015-08-30 DIAGNOSIS — J4540 Moderate persistent asthma, uncomplicated: Secondary | ICD-10-CM | POA: Insufficient documentation

## 2015-08-30 DIAGNOSIS — J302 Other seasonal allergic rhinitis: Secondary | ICD-10-CM | POA: Insufficient documentation

## 2015-09-02 DIAGNOSIS — F1120 Opioid dependence, uncomplicated: Secondary | ICD-10-CM | POA: Insufficient documentation

## 2015-09-06 DIAGNOSIS — R5381 Other malaise: Secondary | ICD-10-CM | POA: Diagnosis not present

## 2015-09-06 DIAGNOSIS — R5383 Other fatigue: Secondary | ICD-10-CM | POA: Diagnosis not present

## 2015-09-12 DIAGNOSIS — M545 Low back pain: Secondary | ICD-10-CM | POA: Diagnosis not present

## 2015-11-21 DIAGNOSIS — M5416 Radiculopathy, lumbar region: Secondary | ICD-10-CM | POA: Diagnosis not present

## 2015-11-21 DIAGNOSIS — M545 Low back pain: Secondary | ICD-10-CM | POA: Diagnosis not present

## 2015-11-24 NOTE — Progress Notes (Signed)
Cardiology Office Note:    Date:  11/25/2015   ID:  Luke Wong, DOB [birth year], MRN [id number]  PCP:  Haywood Pao, MD  Cardiologist:  Dr. Dorris Carnes   Electrophysiologist:  n/a  Referring MD: Lona Kettle, MD   Chief Complaint  Patient presents with  . Chest Pain    History of Present Illness:    Luke Wong is a 45 y.o. male with a hx of HTN, HL, FHx CAD.  Notes indicate he has had a Ca score of 0 in the past.  He is not aware that he had this test. Last seen by Dr. Dorris Carnes in 2/17.  He returns today for evaluation of chest discomfort. He has had these symptoms off and on for the past several months. It was much worse this week and he decided to come in for evaluation. He has substernal, sharp discomfort that occurs randomly. It is not associated with exertion. He denies associated symptoms. He denies orthopnea, PND or edema. He denies dyspnea. He denies syncope. The pain has awoken him from sleep in the past. It only lasts 1-2 minutes. He is quite concerned given his strong family history for CAD.  Prior CV studies that were reviewed today include:    Carotid US 12/12 [facility], [place]) Unremarkable; < 50% bilaterally  ETT - Echo 12/12 [facility], [place]) Normal, [place] 11'14"; non-specific ST changes  Per notes - Cardiac CT 11/12 No calcification  Past Medical History:  Diagnosis Date  . Asthma   . Depression   . GERD (gastroesophageal reflux disease)   . History of CT scan of chest    a. Notes indicate Cardiac CT with Ca score 0  . History of Doppler ultrasound    a. Carotid US 12/12 done in [place], [place] - Unremarkable; < 50% bilaterally  . History of stress test    a. ETT-Echo 12/12 done in [place], [place] - Normal, Ex 11'14"; non-specific ST changes  . Hyperlipidemia   . Pneumonia 2015    Past Surgical History:  Procedure Laterality Date  . ANKLE SURGERY Left 06/07/13, 3/16   Arthroscopic debridement  x2  . HERNIA REPAIR Bilateral  1994  . LUMBAR LAMINECTOMY/ DECOMPRESSION WITH MET-RX Right 11/30/2014   Procedure: Right L4-5 Far Lateral Microdiskectomy w/ Metrx;  Surgeon: Kevan Ny Ditty, MD;  Location: [place] [facility];  Service: Neurosurgery;  Laterality: Right;  Right L4-5 Far Lateral Microdiskectomy    Current Medications:   Current Meds  Medication Sig  . ANDROGEL PUMP 20.25 MG/ACT (1.62%) GEL Inject 1 application into the skin daily.   Marland Kitchen aspirin 81 MG tablet Take 81 mg by mouth daily.  Marland Kitchen atorvastatin (LIPITOR) 40 MG tablet Take 1 tablet (40 mg total) by mouth daily.  . cetirizine (ZYRTEC) 10 MG tablet Take 10 mg by mouth daily.  . DULoxetine (CYMBALTA) 60 MG capsule Take 60 mg by mouth daily.   . fenofibrate (TRICOR) 145 MG tablet Take 1 tablet (145 mg total) by mouth daily.  . finasteride (PROPECIA) 1 MG tablet Take 1 mg by mouth daily.  . meloxicam (MOBIC) 7.5 MG tablet Take 7.5 mg by mouth 2 (two) times daily.  Marland Kitchen triamterene-hydrochlorothiazide (MAXZIDE-25) 37.5-25 MG tablet Take 0.5 tablets by mouth daily.    Allergies:   Demerol [meperidine]   Social History   Social History  . Marital status: Married    Spouse name: N/A  . Number of children: N/A  . Years of education: N/A  Occupational History  . Attorney    Social History Main Topics  . Smoking status: Never Smoker  . Smokeless tobacco: Never Used  . Alcohol use Yes     Comment: once per wk 0- 3 wine, beer, liquor  . Drug use: No  . Sexual activity: Not Asked   Other Topics Concern  . None   Social History Narrative  . None     Family History:  The patient's   family history includes Heart attack in his father and paternal grandfather; Heart disease in his father; Thyroid cancer in his mother.   ROS:   Please see the history of present illness.    Review of Systems  Cardiovascular: Positive for chest pain.   All other systems reviewed and are negative.   EKGs/Labs/Other Test Reviewed:    EKG:  EKG is  ordered today.  The  ekg ordered today demonstrates NSR, HR 89, normal axis, QTc 450 ms, no significant change since prior tracing  Recent Labs: 06/23/2015: Hemoglobin 14.5; Platelets 333   Recent Lipid Panel    Component Value Date/Time   CHOL 282 (H) 06/23/2015 0855   TRIG 740 (H) 06/23/2015 0855   HDL 34 (L) 06/23/2015 0855   CHOLHDL 8.3 (H) 06/23/2015 0855   VLDL NOT CALC 06/23/2015 0855   LDLCALC NOT CALC 06/23/2015 0855   LDLDIRECT 113.0 09/16/2014 0814     Physical Exam:    VS:  BP 112/60   Pulse 89   Ht 6' (1.829 m)   Wt 248 lb 12.8 oz (112.9 kg)   BMI 33.74 kg/m     Wt Readings from Last 3 Encounters:  11/25/15 248 lb 12.8 oz (112.9 kg)  06/17/15 254 lb (115.2 kg)  05/27/15 258 lb 12.8 oz (117.4 kg)     Physical Exam  Constitutional: He is oriented to person, place, and time. He appears well-developed and well-nourished. No distress.  HENT:  Head: Normocephalic and atraumatic.  Eyes: No scleral icterus.  Neck: Normal range of motion. No JVD present. Carotid bruit is not present.  Cardiovascular: Normal rate, regular rhythm, S1 normal and S2 normal.  Exam reveals no gallop and no friction rub.   No murmur heard. Pulmonary/Chest: Effort normal and breath sounds normal. He has no wheezes. He has no rhonchi. He has no rales.  Abdominal: Soft. He exhibits no distension and no mass. There is no tenderness.  Musculoskeletal: Normal range of motion. He exhibits no edema.  Neurological: He is alert and oriented to person, place, and time.  Skin: Skin is warm and dry.  Psychiatric: He has a normal mood and affect.    ASSESSMENT:    1. Chest pain, unspecified chest pain type   2. Essential hypertension   3. Hyperlipidemia    PLAN:    In order of problems listed above:  1. Chest Pain - He presents with somewhat atypical chest discomfort. He has had recent back surgery as well as bilateral ankle surgery. He is somewhat sedentary. He has a long history of dyslipidemia as well as a  strong family history of premature CAD. He is quite concerned about his symptoms. I have suggested stress testing. Unfortunately, with his ankles, he would not be able to walk on a treadmill.  He is concerned about what to do when/if he has another episode of chest discomfort. I instructed him on the use of nitroglycerin. He knows to contact us should he experience significant relief of chest symptoms should he decide to   use nitroglycerin.  -  Arrange Lexiscan Myoview.   -  Question GI etiology. Start Protonix 40 mg daily 2 weeks, then when necessary.   -  NTG 0.4 mg prn  2. HTN - BP controlled.   3. HL - Fasting today.  Check Lipids and LFTs.  Consider PCSK-9 inhibitor if Lipids remain out of control.    Medication Adjustments/Labs and Tests Ordered: Current medicines are reviewed at length with the patient today.  Concerns regarding medicines are outlined above.  Medication changes, Labs and Tests ordered today are outlined in the Patient Instructions noted below. Patient Instructions  Medication Instructions:  Your physician has recommended you make the following change in your medication:  1) START Protonix 57m daily for 2 weeks then take daily as needed 2) USE Nitro-glycerin as needed as directed Labwork: Lipid and Lft today Testing/Procedures: Your physician has requested that you have a lexiscan myoview. For further information please visit [email or web address] Please follow instruction sheet, as given. Follow-Up: Your physician recommends that you schedule a follow-up appointment in: 1 month with Dr.Ross or SRichardson Dopp PA-C Any Other Special Instructions Will Be Listed Below (If Applicable). If you need a refill on your cardiac medications before your next appointment, please call your pharmacy.  Signed, SRichardson Dopp PA-C  11/25/2015 9:04 AM    [facility] [address] [place]  [id number]: [phone] Fax: [phone]

## 2015-11-25 ENCOUNTER — Encounter: Payer: Self-pay | Admitting: Physician Assistant

## 2015-11-25 ENCOUNTER — Ambulatory Visit (INDEPENDENT_AMBULATORY_CARE_PROVIDER_SITE_OTHER): Payer: BLUE CROSS/BLUE SHIELD | Admitting: Physician Assistant

## 2015-11-25 VITALS — BP 112/60 | HR 89 | Ht 72.0 in | Wt 248.8 lb

## 2015-11-25 DIAGNOSIS — E785 Hyperlipidemia, unspecified: Secondary | ICD-10-CM | POA: Diagnosis not present

## 2015-11-25 DIAGNOSIS — R079 Chest pain, unspecified: Secondary | ICD-10-CM

## 2015-11-25 DIAGNOSIS — I10 Essential (primary) hypertension: Secondary | ICD-10-CM

## 2015-11-25 LAB — HEPATIC FUNCTION PANEL
ALT: 67 U/L — ABNORMAL HIGH (ref 9–46)
AST: 54 U/L — ABNORMAL HIGH (ref 10–40)
Albumin: 4.3 g/dL (ref 3.6–5.1)
Alkaline Phosphatase: 53 U/L (ref 40–115)
Bilirubin, Direct: 0.1 mg/dL (ref <=0.2)
Indirect Bilirubin: 0.4 mg/dL (ref 0.2–1.2)
Total Bilirubin: 0.5 mg/dL (ref 0.2–1.2)
Total Protein: 6.9 g/dL (ref 6.1–8.1)

## 2015-11-25 LAB — LIPID PANEL
Cholesterol: 139 mg/dL (ref 125–200)
HDL: 20 mg/dL — ABNORMAL LOW (ref >=40)
LDL Cholesterol: 72 mg/dL (ref <130)
Total CHOL/HDL Ratio: 7 Ratio — ABNORMAL HIGH (ref <=5.0)
Triglycerides: 235 mg/dL — ABNORMAL HIGH (ref <150)
VLDL: 47 mg/dL — ABNORMAL HIGH (ref <30)

## 2015-11-25 MED ORDER — PANTOPRAZOLE SODIUM 40 MG PO TBEC: 40.0000 mg | DELAYED_RELEASE_TABLET | Freq: Every day | ORAL | 2 refills | Status: DC

## 2015-11-25 MED ORDER — NITROGLYCERIN 0.4 MG SL SUBL: 0.4000 mg | SUBLINGUAL_TABLET | SUBLINGUAL | 3 refills | Status: DC | PRN

## 2015-11-25 NOTE — Patient Instructions (Addendum)
Medication Instructions:  Your physician has recommended you make the following change in your medication:  1) START Protonix 40mg  daily for 2 weeks then take daily as needed 2) USE Nitro-glycerin as needed as directed Labwork: Lipid and Lft today Testing/Procedures: Your physician has requested that you have a lexiscan myoview. For further information please visit [email or web address] Please follow instruction sheet, as given. Follow-Up: Your physician recommends that you schedule a follow-up appointment in: 1 month with Dr.Ross or Tereso Newcomer, PA-C Any Other Special Instructions Will Be Listed Below (If Applicable). If you need a refill on your cardiac medications before your next appointment, please call your pharmacy.

## 2015-11-28 ENCOUNTER — Telehealth (HOSPITAL_COMMUNITY): Payer: Self-pay | Admitting: *Deleted

## 2015-11-28 NOTE — Telephone Encounter (Signed)
Left message on voicemail per DPR in reference to upcoming appointment scheduled on 11/29/15 at 0945 with detailed instructions given per Myocardial Perfusion Study Information Sheet for the test. LM to arrive 15 minutes early, and that it is imperative to arrive on time for appointment to keep from having the test rescheduled. If you need to cancel or reschedule your appointment, please call the office within 24 hours of your appointment. Failure to do so may result in a cancellation of your appointment, and a $50 no show fee. Phone number given for call back for any questions.Jeziah Kretschmer, Adelene IdlerCynthia W

## 2015-11-29 ENCOUNTER — Encounter: Payer: Self-pay | Admitting: Physician Assistant

## 2015-11-29 ENCOUNTER — Ambulatory Visit (HOSPITAL_COMMUNITY): Payer: BLUE CROSS/BLUE SHIELD | Attending: Cardiology

## 2015-11-29 ENCOUNTER — Telehealth: Payer: Self-pay | Admitting: *Deleted

## 2015-11-29 DIAGNOSIS — I779 Disorder of arteries and arterioles, unspecified: Secondary | ICD-10-CM | POA: Diagnosis not present

## 2015-11-29 DIAGNOSIS — R9439 Abnormal result of other cardiovascular function study: Secondary | ICD-10-CM | POA: Insufficient documentation

## 2015-11-29 DIAGNOSIS — Z8249 Family history of ischemic heart disease and other diseases of the circulatory system: Secondary | ICD-10-CM | POA: Diagnosis not present

## 2015-11-29 DIAGNOSIS — R079 Chest pain, unspecified: Secondary | ICD-10-CM | POA: Insufficient documentation

## 2015-11-29 LAB — MYOCARDIAL PERFUSION IMAGING
LV dias vol: 105 mL (ref 62–150)
LV sys vol: 48 mL
Peak HR: 94 {beats}/min
RATE: 0.3
Rest HR: 76 {beats}/min
SDS: 2
SRS: 3
SSS: 5
TID: 1.13

## 2015-11-29 MED ORDER — TECHNETIUM TC 99M TETROFOSMIN IV KIT
31.7000 | PACK | Freq: Once | INTRAVENOUS | Status: AC | PRN
  Administered 2015-11-29: 31.7 via INTRAVENOUS
  Filled 2015-11-29: qty 32

## 2015-11-29 MED ORDER — TECHNETIUM TC 99M TETROFOSMIN IV KIT
10.4000 | PACK | Freq: Once | INTRAVENOUS | Status: AC | PRN
  Administered 2015-11-29: 10 via INTRAVENOUS
  Filled 2015-11-29: qty 10

## 2015-11-29 MED ORDER — REGADENOSON 0.4 MG/5ML IV SOLN
0.4000 mg | Freq: Once | INTRAVENOUS | Status: AC
Start: 2015-11-29 — End: 2015-11-29
  Administered 2015-11-29: 0.4 mg via INTRAVENOUS

## 2015-11-29 NOTE — Telephone Encounter (Signed)
Pt notified of myoview results by phone with verbal understanding 

## 2015-12-14 DIAGNOSIS — Z6833 Body mass index (BMI) 33.0-33.9, adult: Secondary | ICD-10-CM | POA: Diagnosis not present

## 2015-12-14 DIAGNOSIS — R030 Elevated blood-pressure reading, without diagnosis of hypertension: Secondary | ICD-10-CM | POA: Diagnosis not present

## 2015-12-14 DIAGNOSIS — M5416 Radiculopathy, lumbar region: Secondary | ICD-10-CM | POA: Diagnosis not present

## 2015-12-26 NOTE — Progress Notes (Signed)
Cardiology Office Note:    Date:  12/27/2015   ID:  Luke Wong, DOB [birth year], MRN [id number]  PCP:  Haywood Pao, MD  Cardiologist:  Dr. Dorris Carnes   Electrophysiologist:  n/a  Referring MD: Liliane Shi, PA-C   Chief Complaint  Patient presents with  . Follow-up    Chest pain    History of Present Illness:    Luke Wong is a 45 y.o. male with a hx of HTN, HL, FHx CAD.  Notes indicate he has had a Ca score of 0 in the past.    He was seen 11/25/15 with complaints of atypical chest discomfort. He had undergone recent ankle surgery and could not walk on a treadmill. Therefore, he underwent pharmacologic nuclear stress test. This demonstrated no ischemia, EF 54%. He was started on PPI. He returns for follow-up.He is here alone today. He tried Protonix for a couple weeks. He could not tell that it made much of a difference. He does note only one episode of chest discomfort since last seen. It lasted maybe a minute. It was not related to any activity. There were no associated symptoms. He cannot relate his symptoms to meals. It does not seem to be positional. He denies exertional dyspnea, orthopnea, PND or edema. Denies syncope.  Prior CV studies that were reviewed today include:    Myoview 11/29/15 Small, mild fixed anteroseptal perfusion defect, likely artifact. No reversible ischemia. LVEF 54% with normal wall motion. This is a low risk study.  Carotid US 12/12 [facility], [place]) Unremarkable; < 50% bilaterally  ETT - Echo 12/12 [facility], [place]) Normal, [place] 11'14"; non-specific ST changes  Per notes - Cardiac CT 11/12 No calcification  Past Medical History:  Diagnosis Date  . Asthma   . Depression   . GERD (gastroesophageal reflux disease)   . History of CT scan of chest    a. Notes indicate Cardiac CT with Ca score 0  . History of Doppler ultrasound    a. Carotid US 12/12 done in [place], [place] - Unremarkable; < 50% bilaterally  .  History of stress test    a. ETT-Echo 12/12 done in [place], [place] - Normal, Ex 11'14"; non-specific ST changes  /  b. Myoview 8/17: EF 54%, anteroseptal defect-likely artifact, no ischemia, low risk  . Hyperlipidemia   . Pneumonia 2015    Past Surgical History:  Procedure Laterality Date  . ANKLE SURGERY Left 06/07/13, 3/16   Arthroscopic debridement  x2  . HERNIA REPAIR Bilateral 1994  . LUMBAR LAMINECTOMY/ DECOMPRESSION WITH MET-RX Right 11/30/2014   Procedure: Right L4-5 Far Lateral Microdiskectomy w/ Metrx;  Surgeon: Kevan Ny Ditty, MD;  Location: [place] [facility];  Service: Neurosurgery;  Laterality: Right;  Right L4-5 Far Lateral Microdiskectomy    Current Medications: Outpatient Medications Prior to Visit  Medication Sig Dispense Refill  . ANDROGEL PUMP 20.25 MG/ACT (1.62%) GEL Inject 1 application into the skin daily.   0  . aspirin 81 MG tablet Take 81 mg by mouth daily.    Marland Kitchen atorvastatin (LIPITOR) 40 MG tablet Take 1 tablet (40 mg total) by mouth daily. 90 tablet 3  . cetirizine (ZYRTEC) 10 MG tablet Take 10 mg by mouth daily.    . DULoxetine (CYMBALTA) 60 MG capsule Take 60 mg by mouth daily.     . fenofibrate (TRICOR) 145 MG tablet Take 1 tablet (145 mg total) by mouth daily. 90 tablet 3  . finasteride (PROPECIA) 1  MG tablet Take 1 mg by mouth daily.    . meloxicam (MOBIC) 7.5 MG tablet Take 7.5 mg by mouth 2 (two) times daily.    . nitroGLYCERIN (NITROSTAT) 0.4 MG SL tablet Place 1 tablet (0.4 mg total) under the tongue every 5 (five) minutes as needed. 25 tablet 3  . pantoprazole (PROTONIX) 40 MG tablet Take 1 tablet (40 mg total) by mouth daily. For 2 weeks then take daily as needed 30 tablet 2  . triamterene-hydrochlorothiazide (MAXZIDE-25) 37.5-25 MG tablet Take 0.5 tablets by mouth daily. 45 tablet 3   No facility-administered medications prior to visit.       Allergies:   Demerol [meperidine]   Social History   Social History  . Marital status: Married    Spouse  name: N/A  . Number of children: N/A  . Years of education: N/A   Occupational History  . Attorney    Social History Main Topics  . Smoking status: Never Smoker  . Smokeless tobacco: Never Used  . Alcohol use Yes     Comment: once per wk 0- 3 wine, beer, liquor  . Drug use: No  . Sexual activity: Not Asked   Other Topics Concern  . None   Social History Narrative  . None     Family History:  The patient's family history includes Heart attack in his father and paternal grandfather; Heart disease in his father; Thyroid cancer in his mother.   ROS:   Please see the history of present illness.    ROS All other systems reviewed and are negative.   EKGs/Labs/Other Test Reviewed:    EKG:  EKG is not ordered today.  The ekg ordered today demonstrates n/a  Recent Labs: 06/23/2015: Hemoglobin 14.5; Platelets 333 11/25/2015: ALT 67   Recent Lipid Panel    Component Value Date/Time   CHOL 139 11/25/2015 0917   TRIG 235 (H) 11/25/2015 0917   HDL 20 (L) 11/25/2015 0917   CHOLHDL 7.0 (H) 11/25/2015 0917   VLDL 47 (H) 11/25/2015 0917   LDLCALC 72 11/25/2015 0917   LDLDIRECT 113.0 09/16/2014 0814     Physical Exam:    VS:  BP 128/80   Pulse 94   Ht 6' (1.829 m)   Wt 248 lb 6.4 oz (112.7 kg)   BMI 33.69 kg/m     Wt Readings from Last 3 Encounters:  12/27/15 248 lb 6.4 oz (112.7 kg)  11/25/15 248 lb 12.8 oz (112.9 kg)  06/17/15 254 lb (115.2 kg)     Physical Exam  Constitutional: He is oriented to person, place, and time. He appears well-developed and well-nourished. No distress.  HENT:  Head: Normocephalic and atraumatic.  Eyes: No scleral icterus.  Neck: Normal range of motion. No JVD present.  Cardiovascular: Normal rate, regular rhythm, S1 normal and S2 normal.   No murmur heard. Pulmonary/Chest: Effort normal and breath sounds normal. He has no wheezes. He has no rhonchi. He has no rales.  Abdominal: Soft. There is no tenderness.  Musculoskeletal: He exhibits  no edema.  Neurological: He is alert and oriented to person, place, and time.  Skin: Skin is warm and dry.  Psychiatric: He has a normal mood and affect.    ASSESSMENT:    1. Chest pain, unspecified chest pain type   2. Essential hypertension   3. Hyperlipidemia   4. Elevated LFTs    PLAN:    In order of problems listed above:  1. Chest Pain - He was  recently seen for atypical chest discomfort.  Lexiscan Myoview was low risk and neg for ischemia. I have reassured him today. No further cardiac workup necessary. He still has occasional episodes of chest discomfort. Etiology is not entirely clear. I have asked him to follow-up sooner if he has symptoms associated with dyspnea, exertional symptoms or symptoms that are relieved by nitroglycerin.  2. HTN - BP at target.   3. HL - Recent Lipids improved.  LDL at goal.  Trigs much better.  4. Elevated LFTs - Recent LFTs with ALT 67, AST 54.  Repeat LFTs today.  If still elevated, will have him FU with PCP for eval.  Given how high his Trigs have been in the past, suspect he may have hepatic steatosis.     Medication Adjustments/Labs and Tests Ordered: Current medicines are reviewed at length with the patient today.  Concerns regarding medicines are outlined above.  Medication changes, Labs and Tests ordered today are outlined in the Patient Instructions noted below. Patient Instructions  Medication Instructions:  No changes.  See your medication list. Labwork: Today - LFTs Testing/Procedures: None  Follow-Up: Your physician wants you to follow-up in: Dr. Dorris Carnes in 1 year or sooner if symptoms worsen or change.  You will receive a reminder letter in the mail two months in advance. If you don't receive a letter, please call our office to schedule the follow-up appointment. Any Other Special Instructions Will Be Listed Below (If Applicable). If you need a refill on your cardiac medications before your next appointment, please call  your pharmacy.   Signed, Richardson Dopp, PA-C  12/27/2015 8:56 AM    [facility] [place], [place], [place]  [address] Phone: [phone]; Fax: [phone]

## 2015-12-27 ENCOUNTER — Ambulatory Visit (INDEPENDENT_AMBULATORY_CARE_PROVIDER_SITE_OTHER): Payer: BLUE CROSS/BLUE SHIELD | Admitting: Physician Assistant

## 2015-12-27 ENCOUNTER — Encounter: Payer: Self-pay | Admitting: Physician Assistant

## 2015-12-27 VITALS — BP 128/80 | HR 94 | Ht 72.0 in | Wt 248.4 lb

## 2015-12-27 DIAGNOSIS — R7989 Other specified abnormal findings of blood chemistry: Secondary | ICD-10-CM | POA: Diagnosis not present

## 2015-12-27 DIAGNOSIS — R079 Chest pain, unspecified: Secondary | ICD-10-CM | POA: Diagnosis not present

## 2015-12-27 DIAGNOSIS — M5416 Radiculopathy, lumbar region: Secondary | ICD-10-CM | POA: Diagnosis not present

## 2015-12-27 DIAGNOSIS — I10 Essential (primary) hypertension: Secondary | ICD-10-CM | POA: Diagnosis not present

## 2015-12-27 DIAGNOSIS — E785 Hyperlipidemia, unspecified: Secondary | ICD-10-CM | POA: Diagnosis not present

## 2015-12-27 DIAGNOSIS — R945 Abnormal results of liver function studies: Secondary | ICD-10-CM

## 2015-12-27 LAB — HEPATIC FUNCTION PANEL
ALT: 72 U/L — ABNORMAL HIGH (ref 9–46)
AST: 52 U/L — ABNORMAL HIGH (ref 10–40)
Albumin: 4.3 g/dL (ref 3.6–5.1)
Alkaline Phosphatase: 49 U/L (ref 40–115)
Bilirubin, Direct: 0.1 mg/dL (ref <=0.2)
Indirect Bilirubin: 0.4 mg/dL (ref 0.2–1.2)
Total Bilirubin: 0.5 mg/dL (ref 0.2–1.2)
Total Protein: 6.9 g/dL (ref 6.1–8.1)

## 2015-12-27 NOTE — Patient Instructions (Addendum)
Medication Instructions:  No changes.  See your medication list. Labwork: Today - LFTs Testing/Procedures: None  Follow-Up: Your physician wants you to follow-up in: Dr. Dietrich PatesPaula Ross in 1 year or sooner if symptoms worsen or change.  You will receive a reminder letter in the mail two months in advance. If you don't receive a letter, please call our office to schedule the follow-up appointment. Any Other Special Instructions Will Be Listed Below (If Applicable). If you need a refill on your cardiac medications before your next appointment, please call your pharmacy.

## 2015-12-29 DIAGNOSIS — M5416 Radiculopathy, lumbar region: Secondary | ICD-10-CM | POA: Diagnosis not present

## 2015-12-29 DIAGNOSIS — Z6833 Body mass index (BMI) 33.0-33.9, adult: Secondary | ICD-10-CM | POA: Diagnosis not present

## 2015-12-29 DIAGNOSIS — M5126 Other intervertebral disc displacement, lumbar region: Secondary | ICD-10-CM | POA: Diagnosis not present

## 2016-01-03 ENCOUNTER — Telehealth: Payer: Self-pay | Admitting: Internal Medicine

## 2016-01-03 ENCOUNTER — Telehealth: Payer: Self-pay | Admitting: *Deleted

## 2016-01-03 NOTE — Telephone Encounter (Signed)
Lmtcb x 3 to go over lab results and recommendations.  

## 2016-01-03 NOTE — Telephone Encounter (Signed)
Notes Recorded by Beatrice LecherScott T Weaver, PA-C on 12/27/2015 at 5:02 PM EDT LFTs remain elevated. No change. Ok to continue current medications. But, he needs FU with his PCP to further evaluate. Please fax copy to his PCP: Gaspar Garbeichard W Tisovec, MD. Please have patient arrange FU with his PCP as well. Tereso NewcomerScott Weaver, PA-C  12/27/2015 5:02 PM   Informed patient of Tereso NewcomerScott Weaver PA's recommendations after reviewing his lab results. Patient verbalized understanding. Will forward results to Dr. Wylene Simmerisovec.

## 2016-01-03 NOTE — Telephone Encounter (Signed)
New message  Pt call requesting to speak with RN. Pt states he received a call from cardiology office on 9/11. Pt states he does not know what the call could be about. Please call back to discuss

## 2016-01-05 NOTE — Telephone Encounter (Signed)
Pt notified of lab results and findings by phone. Pt advised to f/u w/PCP due elevated LFT's.

## 2016-01-16 DIAGNOSIS — M5416 Radiculopathy, lumbar region: Secondary | ICD-10-CM | POA: Diagnosis not present

## 2016-02-02 DIAGNOSIS — L404 Guttate psoriasis: Secondary | ICD-10-CM | POA: Diagnosis not present

## 2016-02-16 DIAGNOSIS — M545 Low back pain: Secondary | ICD-10-CM | POA: Diagnosis not present

## 2016-02-16 DIAGNOSIS — M5416 Radiculopathy, lumbar region: Secondary | ICD-10-CM | POA: Diagnosis not present

## 2016-02-16 DIAGNOSIS — I10 Essential (primary) hypertension: Secondary | ICD-10-CM | POA: Diagnosis not present

## 2016-04-17 DIAGNOSIS — J310 Chronic rhinitis: Secondary | ICD-10-CM | POA: Diagnosis not present

## 2016-04-17 DIAGNOSIS — H7201 Central perforation of tympanic membrane, right ear: Secondary | ICD-10-CM | POA: Diagnosis not present

## 2016-04-17 DIAGNOSIS — H6983 Other specified disorders of Eustachian tube, bilateral: Secondary | ICD-10-CM | POA: Diagnosis not present

## 2016-05-17 ENCOUNTER — Encounter: Payer: Self-pay | Admitting: Internal Medicine

## 2016-05-17 ENCOUNTER — Ambulatory Visit (INDEPENDENT_AMBULATORY_CARE_PROVIDER_SITE_OTHER): Payer: BLUE CROSS/BLUE SHIELD | Admitting: Internal Medicine

## 2016-05-17 ENCOUNTER — Ambulatory Visit (INDEPENDENT_AMBULATORY_CARE_PROVIDER_SITE_OTHER)
Admission: RE | Admit: 2016-05-17 | Discharge: 2016-05-17 | Disposition: A | Discharge status: Home / Self Care | Payer: BLUE CROSS/BLUE SHIELD | Source: Ambulatory Visit | Attending: Internal Medicine | Admitting: Internal Medicine

## 2016-05-17 VITALS — BP 128/86 | HR 78 | Temp 98.7°F | Ht 72.0 in | Wt 237.0 lb

## 2016-05-17 DIAGNOSIS — Z6832 Body mass index (BMI) 32.0-32.9, adult: Secondary | ICD-10-CM | POA: Diagnosis not present

## 2016-05-17 DIAGNOSIS — R058 Other specified cough: Secondary | ICD-10-CM

## 2016-05-17 DIAGNOSIS — R05 Cough: Secondary | ICD-10-CM

## 2016-05-17 DIAGNOSIS — M5416 Radiculopathy, lumbar region: Secondary | ICD-10-CM | POA: Diagnosis not present

## 2016-05-17 DIAGNOSIS — M545 Low back pain: Secondary | ICD-10-CM | POA: Diagnosis not present

## 2016-05-17 DIAGNOSIS — I10 Essential (primary) hypertension: Secondary | ICD-10-CM | POA: Diagnosis not present

## 2016-05-17 MED ORDER — PREDNISONE 10 MG PO TABS: ORAL_TABLET | ORAL | 0 refills | Status: DC

## 2016-05-17 MED ORDER — HYDROCODONE-HOMATROPINE 5-1.5 MG/5ML PO SYRP: 5.0000 mL | ORAL_SOLUTION | Freq: Four times a day (QID) | ORAL | 0 refills | Status: DC | PRN

## 2016-05-17 NOTE — Progress Notes (Signed)
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  Prev allergy eval in [place] [facility] cats> dogs (n/a for him) referred 07/17/2013 by Dr Duane LopeAlan Ross for cough eval.   History of Present Illness  07/17/2013 1st [facility] office visit/ Lolamae Voisin  Chief Complaint  Patient presents with  . Pulmonary Consult    Referred per Dr. Sigmund HazelLisa Miller. Pt c/o persistant cough for the past 3 years, but worse for the past year. He also c/o DOE with minimal exertion.    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   qvar not helping - already rx with zpak and prednisone.  Only thing that really knocks it out is tussionex.  Sob mostly just with coughing. Cough tends to be worse after supper and disturbs sleep, never coughs up anything much at all rec Prednisone 10 mg take  4 each am x 2 days,   2 each am x 2 days,  1 each am x 2 days and stop  Take delsym two tsp every 12 hours and supplement if needed with  tramadol 50 mg up to 2 every 4 hours to suppress the urge to cough.  . Once you have eliminated the cough for 3 straight days try reducing the tramadol first,  then the delsym as tolerated.   Pantoprazole (protonix) 40 mg   Take 30-60 min before first meal of the day and Pepcid 20 mg one bedtime singulair 10 mg daily   08/21/2013 f/u ov/Christyne Mccain re: recurrent cough/  Chief Complaint  Patient presents with  . Follow-up    Pt states that his cough has almost resolved. His breathing has improved some, but "sometimes  feel like I am breathing under water".  Has not had to use rescue inhaler since his last visit.   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 asthma Chief Complaint  Patient presents with  . Acute Visit    Pt c/o chest congestion and cough for the past 6 days- started out with ear pain and his PCP gave zithromax.  Cough is non prod.   did not activate action plan. Feels cough rattling in chest but not productive. Using clariton for nasal/ ear congestion s benefit  and no better p zpak rec Take delsym two tsp every 12 hours and supplement if needed with tylenol #3 every 4 hours to suppress the urge to cough.   Augmentin 875 mg take one pill twice daily  X 10 days - take at breakfast and supper with large glass of water.  It would help reduce the usual side effects (diarrhea and yeast infections) if you ate cultured yogurt at lunch.  Prednisone 10 mg take  4 each am x 2 days,   2 each am x 2 days,  1 each am x 2 days and stop  Ear congestion > clariton D over the counter Plan A = Automatic = singulair 10 mg one daily until return  Plan B = Backup  Short or wheeze > albuterol up 2pff every 4 hours if needed Cough > delsym/ tylenol #3 1 every 4 hours Plan C = contingency = whenever you are having a respiratory flare:  Pantoprazole (protonix) 40 mg   Take  30-60 min before first meal of the day and Pepcid 20 mg one bedtime until 100% better    02/26/2014 f/u ov/Ry Moody re: cough  On singulair for chronic rhinitis and ? Asthma plus gerd rx  Chief Complaint  Patient presents with  . Follow-up    Pt states that his cough has improved some.  He is still having popping in ear and diff with hearing, although this has also improved some.   cough was worse after supper until bed and up all night coughing > better now  Last well 4 weeks prior to OV   Prednisone works the best  Severe ear popping and pain when flies, esp on R  Doesn't think singulair helping  No need for saba  >>pred paper    03/23/2014 NP Acute OV  Patient returns for persistent coughing.  Complains of worsening cough that is more frequent and deeper x6 days. Ooccasionally produces mucus, wheezing, tightness/heaviness, HA, some low grade temp over the weekend while out of town.   Saw ENT and was started on pred, flonase and will followup again 12/7. Gets some better but cough never goes away completely.  Last antibiotic  was 1 month ago He denies any chest pain, orthopnea, PND, hemoptysis, edema, nausea, vomiting overt reflux, or leg swelling. Has been using several things for cough control, including Delsym Hydromet, Tylenol 3 and tramadol. Says that Hydromet works the best for his cough Has had several courses of steroids over the last year We discussed the potential side effects of frequent steroid use and frequent narcotic use. Cough is disrupting his home life and working environment Patient is a  Clinical research associate.  He is a never smoker. Denies any unusual hobbies or pets. Does travel frequently to [place]. Has 1 dog. Lives in a rental home w/ no carpet.  rec Return for labs and chest x-ray Begin Delsym 2 teaspoons twice daily Begin Tessalon perles 1 Three times a day   May use Hydromet 1-2 teaspoons every 6 hours as needed for cough control, may make you sleepy Begin Chlortrimeton   4 mg 2 at bedtime May use Chlor-Trimeton 4 mg 1 every 4 hours as needed. For postnasal drip. Throat clearing and drainage, may make you sleepy Continue with Protonix daily before meal Continue with Pepcid at bedti   ENT > R Tympanic tube early Dec 2015    04/19/2014 f/u ov/Ravan Schlemmer re: recurrent cough x 2 months  Chief Complaint  Patient presents with  . Follow-up  PFT done today. Pt states that his cough is some better.  No new co's.   cough worse as day goes on at peaks at hs despite max gerd rx Coughed up some green last week  Not limited by breathing from desired activities   Not convinced resp to saba or worse off singulair  >>PPI/Pepcid/pred pack and hycodan rx   07/26/2014 NP Acute OV  Presents for an acute office visit.  Complains of cough for last 4 days. Minimally productive.  No fever, discolored mucus , chest pain, orthopnea, n/v/d.  Seen last ov for chronic cough , tx w/ steroids , PPI/Pepcid and chlortrimeton . Says cough went totally away for last 3 months . Feels he caught a cold last week and cough started back . He restarted protonix . Does not have any cough syrup.  Had recent ankle surgery and trip [place]. No calf pain, chest pain or hemoptysis.  He is leaving to go [place] for work in 3 days. Wants to get  Better before leaving.  Previous PFT and cxr nml .  No sign sinus congestion.  rec Restart  Pantoprazole (protonix) 40 mg   Take 30-60 min before first meal of the day and Pepcid 20 mg one bedtime until return to office - this is the best way to tell whether stomach acid is contributing to your problem.   Prednisone taper over next week.  Delsym 2 tsp Twice daily  As needed  Cough .  For drainage take chlortrimeton (chlorpheniramine) 4 mg every 4 hours available over the counter (may cause drowsiness)  Hycodan 1 tsp every 4 hours as needed for cough , may make you sleepy.     12/03/2014 acute  ov/Margarett Viti re: acute flare of uacs p et  Chief Complaint  Patient presents  with  . Acute Visit    Pt c/o increased cough for the past 3 days- had back surgery the day before this started. He also c/o minimal chest tightness.    Was doing fine then ET 12/01/14 for back surgery  then 8/11 onset hacking cough/ sob not on acid rx / dry hacking quality to point hard to catch his breath but otherwise really not sob  rec Restart  Pantoprazole (protonix) 40 mg   Take 30-60 min before first meal of the day and Pepcid 20 mg one bedtime until  Cough is gone for a week without the need for cough suppression Prednisone taper over next week.  GERD diet   Hycodan 1 tsp every 4 hours as needed for cough , may make you sleepy.       03/01/2015  Acute  ov/Jacey Eckerson re: head cold to chest onset was acute  02/25/15  Chief Complaint  Patient presents with  . Acute Visit    pt states he started with just a cold and around sat/ sun it has gotten worse. pt c/o dry cough,SOB. no c/o wheezing or chest tightness, fever or chills.   still has dog in bedroom, had ducts cleaned in house.  Not taking pepcid at hs consistently whenever coughing as prev rec  rec Restart  Pantoprazole (protonix) 40 mg   Take 30-60 min before first meal of the day and Pepcid 20 mg one bedtime until  Cough is gone for a week without the need for cough suppression Prednisone 10 mg take  4 each am x 2 days,   2 each am x 2 days,  1 each am x 2 days and stop  advil  cold / sinus vs just pseudofed  Hycodan 1 tsp every 4 hours as needed for cough , may make you sleepy.  Zpak  GERD diet    04/14/2015  f/u ov/Eryka Dolinger re: recurrent cough  Chief Complaint  Patient presents with  . Acute Visit    Pt c/o cough non prod, chest tightness and SOB for the past 2 days.   gets completely better where no need for any med including no nasal steroids  then relapsed 2 days prior to OV   Cough is worse at hs and present all day long but no excess/ purulent sputum or mucus plugs   rec Restart Pantoprazole (protonix) 40 mg   Take 30-60 min  before first meal of the day and Pepcid 20 mg one bedtime until  Cough is gone for a week without the need for cough suppression and restart immediately at the first sign of any flare  Prednisone 10 mg take  4 each am x 2 days,   2 each am x 2 days,  1 each am x 2 days and stop  Resume nasonex one twice daily as a new maintenance therapy  For nasal congestion >> Advil cold / sinus for nose  For cough >> hycodan 1 tsp every 4 hours as needed   For short of breath > albuterol 2 puff every 4 hours as needed  Please see patient coordinator before you leave today  to schedule methacholine challenge for right after the holidays     04/28/2015 acute extended ov/Jovi Zavadil re: chronic cough  Chief Complaint  Patient presents with  . Acute Visit    pt still c/o chest congestion, chest tightness, shortness of breath, headache.  Denies PND, prod cough.    says having sensation of persistent  chest congestion but cough is shrill / harsh upper airway and day  > noct  rec Goal is to elminate completely all coughing while on prednisone and continuing the full acid suppression regimen  Stay on the acid regimen along with the nasal spray  until the methacholine choline challenge > neg  For drainage / throat tickle try take CHLORPHENIRAMINE  4 mg - Prednisone 10 mg take  4 each am x 2 days,   2 each am x 2 days,  1 each am x 2 days and stop  Hycodan 2 tsp every 3-4 hours x 3-5 days and stop     05/27/2015  f/u ov/Shatona Andujar re: UACS / MCT neg on gabapentin 300 avg 3 twice daily ? Really taking regularly  Chief Complaint  Patient presents with  . Follow-up    Pt reports that his cough has improved. Pt c/o sore throat x1 week, bilateral ear pain and occasional dry cough. Pt denies SOB/CP/tightness/sinus congestion.   was better p last ov but really only while on narcs. No obvious patterns  day to day or daytime variability or c sob p or   subjective wheeze or   hb symptoms. No unusual exp hx or h/o childhood pna/ asthma  or knowledge of premature birth. Feels worse hs  rec For drainage / throat tickle try take CHLORPHENIRAMINE  4 mg - take one every 4 hours as needed - available over the counter- may cause drowsiness so start with just a bedtime dose or two and see how you tolerate it before trying in daytime   For cough > hycodan 2 tsp every 4 hours as needed  See Suszanne Conners and ask him about Dr Delford Field at [facility] or  I can refer to Dr Lucie Leather in allergy      05/17/2016 acute extended ov/Jameir Ake re:  Recurrent UACS  Chief Complaint  Patient presents with  . Acute Visit    Pt c/o increased cough since beginning of Jan 2018. He states his cough gets worse as the day goes, and it also wakes him in the night. His cough is non prod. He has had some minimal chest tightness and wheezing.   all started abruptly around 04/23/16 with cough/ chills/ fever aching all over / ha "probably"  Resolved p 3 days and left with cough Had maintained on pepcid not pantoprazole x months s flare until present illness, worse cough as day goes on but also at hs, really not productive at all. Not using any inhalers at all    No obvious day to day or daytime variability or assoc sob or cp or chest tightness, subjective wheeze or overt sinus or hb symptoms. No unusual exp hx or h/o childhood pna/ asthma or knowledge of premature birth.  Sleeping ok without nocturnal  or early am exacerbation  of respiratory  c/o's or need for noct saba. Also denies any obvious fluctuation of symptoms with weather or environmental changes or other aggravating or alleviating factors except as outlined above   Current Medications, Allergies, Complete Past Medical History, Past Surgical History, Family History, and Social History were reviewed in [employer] record.  ROS  The following are not active complaints unless bolded sore throat, dysphagia, dental problems, itching, sneezing,  nasal congestion mod severe/ has not tried afrin or excess/  purulent secretions, ear ache L >R mild ,   fever, chills, sweats, unintended wt loss, classically pleuritic or exertional cp, hemoptysis,  orthopnea pnd or leg swelling, presyncope, palpitations, abdominal pain, anorexia, nausea, vomiting, diarrhea  or change in bowel or bladder habits, change in stools or urine, dysuria,hematuria,  rash, arthralgias, visual complaints, headache, numbness, weakness or ataxia or problems with walking or coordination,  change in mood/affect or memory.                Objective:   Physical Exam  amb wm still with harsh upper airway barking cough   04/14/2015      249 > 04/28/2015     250 > 05/27/2015 259 > 05/17/2016 237 03/01/2015        244   12/03/14 233 lb (105.688 kg)  11/30/14 232 lb (105.235 kg)  11/22/14 232 lb (105.235 kg)    Vital signs reviewed      HEENT: nl dentition, turbinates, and oropharynx which is pristine. Nl external ear canals without cough reflex/  Tympanic tube in place on R   NECK :  without JVD/Nodes/TM/ nl carotid upstrokes bilaterally   LUNGS: no acc muscle use,  No wheezing or stridor , CTA   CV:  RRR  no s3 or murmur or increase in P2, no edema , neg calf pain, neg homans sign   ABD:  soft and nontender with nl excursion in the supine position. No bruits or organomegaly, bowel sounds nl  MS:  warm without deformities, calf tenderness, cyanosis or clubbing  SKIN: warm and dry without lesions             Assessment & Plan:

## 2016-05-17 NOTE — Patient Instructions (Addendum)
Restart Pantoprazole (protonix) 40 mg   Take 30-60 min before first meal of the day and Pepcid 20 mg one bedtime until  Cough is gone for a week without the need for cough suppression and restart immediately at the first sign of any flare of cough for any reason   Prednisone 10 mg take  4 each am x 2 days,   2 each am x 2 days,  1 each am x 2 days and stop    For nasal congestion >> Advil cold / sinus for nose   For cough >> hycodan 1 tsp every 4 hours as needed    GERD (REFLUX)  is an extremely common cause of respiratory symptoms just like yours , many times with no obvious heartburn at all.    It can be treated with medication, but also with lifestyle changes including elevation of the head of your bed (ideally with 6 inch  bed blocks),  Smoking cessation, avoidance of late meals, excessive alcohol, and avoid fatty foods, chocolate, peppermint, colas, red wine, and acidic juices such as orange juice.  NO MINT OR MENTHOL PRODUCTS SO NO COUGH DROPS  USE SUGARLESS CANDY INSTEAD (Jolley ranchers or Stover's or Life Savers) or even ice chips will also do - the key is to swallow to prevent all throat clearing. NO OIL BASED VITAMINS - use powdered substitutes.    Please remember to go to the  x-ray department downstairs for your tests - we will call you with the results when they are available.  Pulmonary follow up is as needed

## 2016-05-17 NOTE — Progress Notes (Signed)
Left detailed msg on vm with results

## 2016-05-17 NOTE — Assessment & Plan Note (Addendum)
-   rx singulair 07/18/2013 > improved 08/21/13 > try off singulair 03/02/14 as pt not convinced helped - Sinus CT 08/28/13 > Sinuses are essentially clear with only minor dependent secretions noted in the inferior right frontal and left maxillary sinuses. There is anatomic narrowing of the ostiomeatal complexes, incompletely evaluated on this limited sinus study. - 02/26/2014 refer to ENT ([facility])> neg sinus dz/ pos ET dysfunction rec flonase  -03/26/14  Allergy profile:  eos 0,  IGgE 87 Dog=cat, dust, ragweed  - 04/19/14  PFTs wnl including fef 25-75 off singulair and all bronchodilators  - NO 04/14/2015 during flare = 28  - Methacholine challenge 05/05/14 > neg for asthma  Recurrent flare off gerd rx. In setting of uri that has not resolved.   Of the three most common causes of chronic cough, only one (GERD)  can actually cause the other two (asthma and post nasal drip syndrome)  and perpetuate the cylce of cough inducing airway trauma, inflammation, heightened sensitivity to reflux which is prompted by the cough itself via a cyclical mechanism.    This may partially respond to steroids and look like asthma and post nasal drainage but never erradicated completely unless the cough and the secondary reflux are eliminated, preferably both at the same time.  While not intuitively obvious, many patients with chronic low grade reflux do not cough until there is a secondary insult that disturbs the protective epithelial barrier and exposes sensitive nerve endings.  This can be viral as was likely the case here  or direct physical injury such as with an endotracheal tube.   The point is that once this occurs, it is difficult to eliminate using anything but a maximally effective acid suppression regimen at least in the short run, accompanied by an appropriate diet to address non acid GERD.   No evidence of asthma even with severe upper airway coughing so focus on UA with max gerd/ cough suppression and  Only pred x 6  days and no need for abx   I had an extended discussion with the patient reviewing all relevant studies completed to date and  lasting 15 to 20 minutes of a 25 minute acute office visit    Each maintenance medication was reviewed in detail including most importantly the difference between maintenance and prns and under what circumstances the prns are to be triggered using an action plan format that is not reflected in the computer generated alphabetically organized AVS.    Please see AVS for specific instructions unique to this visit that I personally wrote and verbalized to the the pt in detail and then reviewed with pt  by my nurse highlighting any  changes in therapy recommended at today's visit to their plan of care.

## 2016-06-07 DIAGNOSIS — M5416 Radiculopathy, lumbar region: Secondary | ICD-10-CM | POA: Diagnosis not present

## 2016-06-07 DIAGNOSIS — M545 Low back pain: Secondary | ICD-10-CM | POA: Diagnosis not present

## 2016-06-07 DIAGNOSIS — I10 Essential (primary) hypertension: Secondary | ICD-10-CM | POA: Diagnosis not present

## 2016-06-07 DIAGNOSIS — Z6832 Body mass index (BMI) 32.0-32.9, adult: Secondary | ICD-10-CM | POA: Diagnosis not present

## 2016-07-11 ENCOUNTER — Other Ambulatory Visit: Payer: Self-pay | Admitting: Internal Medicine

## 2016-07-12 ENCOUNTER — Other Ambulatory Visit: Payer: Self-pay | Admitting: Internal Medicine

## 2016-07-12 MED ORDER — ATORVASTATIN CALCIUM 40 MG PO TABS
40.0000 mg | ORAL_TABLET | Freq: Every day | ORAL | 1 refills | Status: DC
Start: 2016-07-12 — End: 2017-01-18

## 2016-08-09 ENCOUNTER — Other Ambulatory Visit: Payer: Self-pay | Admitting: Internal Medicine

## 2016-08-30 DIAGNOSIS — M5416 Radiculopathy, lumbar region: Secondary | ICD-10-CM | POA: Diagnosis not present

## 2016-08-30 DIAGNOSIS — M545 Low back pain: Secondary | ICD-10-CM | POA: Diagnosis not present

## 2016-08-30 DIAGNOSIS — Z6832 Body mass index (BMI) 32.0-32.9, adult: Secondary | ICD-10-CM | POA: Diagnosis not present

## 2016-08-30 DIAGNOSIS — I10 Essential (primary) hypertension: Secondary | ICD-10-CM | POA: Diagnosis not present

## 2016-08-31 DIAGNOSIS — Z125 Encounter for screening for malignant neoplasm of prostate: Secondary | ICD-10-CM | POA: Diagnosis not present

## 2016-08-31 DIAGNOSIS — I10 Essential (primary) hypertension: Secondary | ICD-10-CM | POA: Diagnosis not present

## 2016-08-31 DIAGNOSIS — Z0000 Encounter for general adult medical examination without abnormal findings: Secondary | ICD-10-CM | POA: Diagnosis not present

## 2016-09-07 DIAGNOSIS — Z1389 Encounter for screening for other disorder: Secondary | ICD-10-CM | POA: Diagnosis not present

## 2016-09-07 DIAGNOSIS — E291 Testicular hypofunction: Secondary | ICD-10-CM | POA: Insufficient documentation

## 2016-09-07 DIAGNOSIS — Z0000 Encounter for general adult medical examination without abnormal findings: Secondary | ICD-10-CM | POA: Diagnosis not present

## 2016-09-07 DIAGNOSIS — E668 Other obesity: Secondary | ICD-10-CM | POA: Diagnosis not present

## 2016-09-07 DIAGNOSIS — E298 Other testicular dysfunction: Secondary | ICD-10-CM | POA: Diagnosis not present

## 2016-09-07 DIAGNOSIS — F321 Major depressive disorder, single episode, moderate: Secondary | ICD-10-CM | POA: Diagnosis not present

## 2016-09-07 DIAGNOSIS — J45998 Other asthma: Secondary | ICD-10-CM | POA: Diagnosis not present

## 2016-10-30 IMAGING — MR MR LUMBAR SPINE W/O CM
4 of 5 series · 19 of 48 positions shown · non-contrast
Comparison: None.

CLINICAL DATA: Right-sided low back pain radiating down the right
leg to the knee. Burning and numbness from the right knee to the
foot for 7 days.

EXAM:
MRI LUMBAR SPINE WITHOUT CONTRAST
TECHNIQUE: Multiplanar, multisequence MR imaging of the lumbar spine was
performed. No intravenous contrast was administered.

[Series 2: T1 · sagittal · 4.0mm · 0.51mm/px · 3 of 12 slices shown (1 of 2)]
[im 1/12]
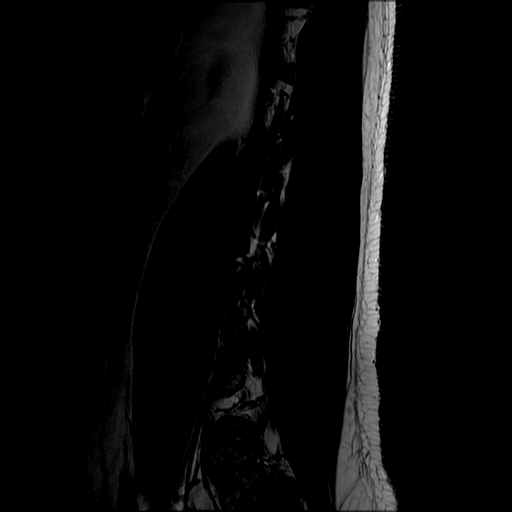
[im 8/12]
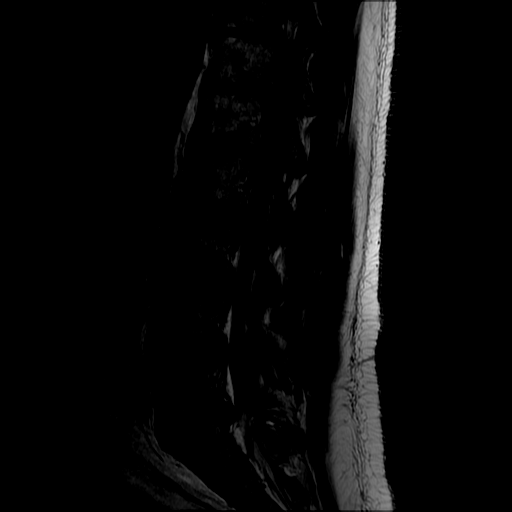
[im 12/12]
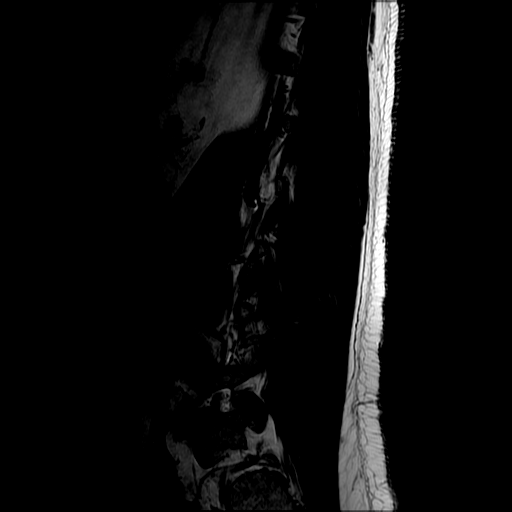

[Series 3: T2 · sagittal · 4.0mm · 0.51mm/px · 5 of 12 slices shown (1 of 2)]
[im 1/12]
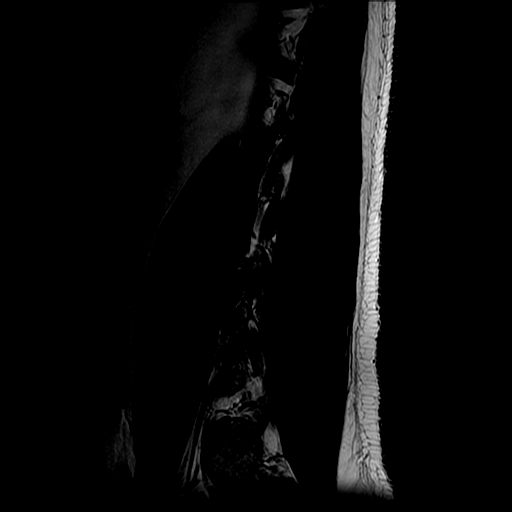
[im 3/12]
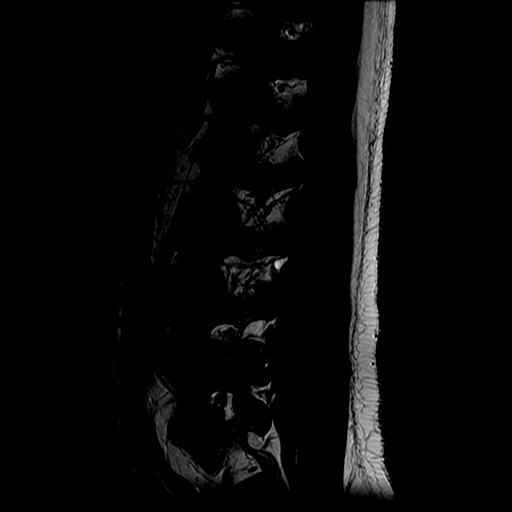
[im 6/12]
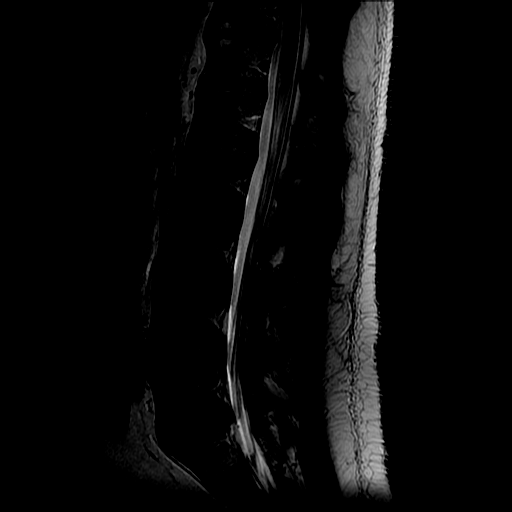
[im 9/12]
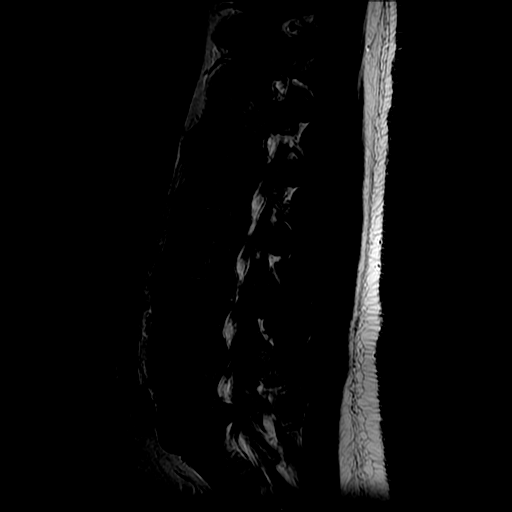
[im 12/12]
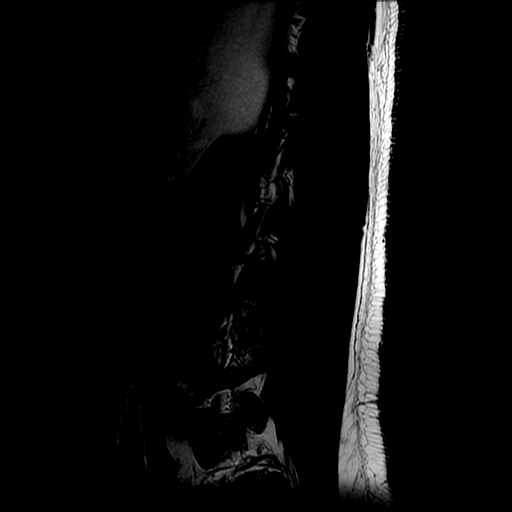

[Series 5: T2 · axial · 4.0mm · 0.39mm/px · z∈[-143,+44]mm · 8 of 40 slices shown (2 of 2)]
[im 3/40]
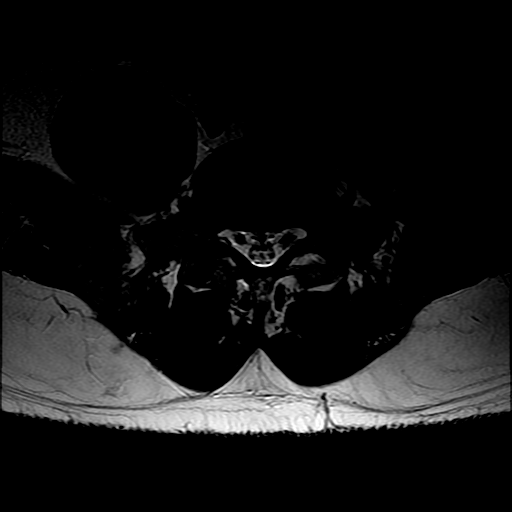
[im 5/40]
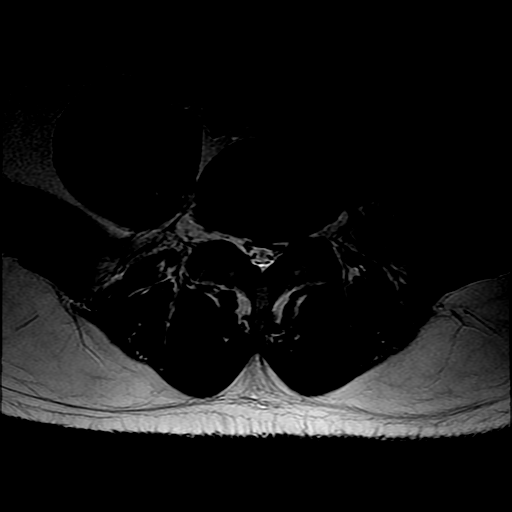
[im 8/40]
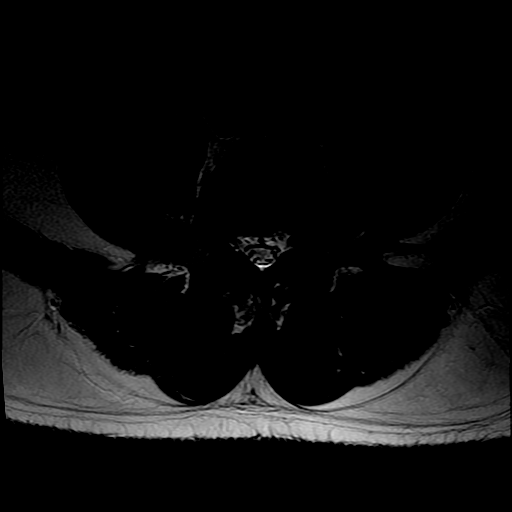
[im 13/40]
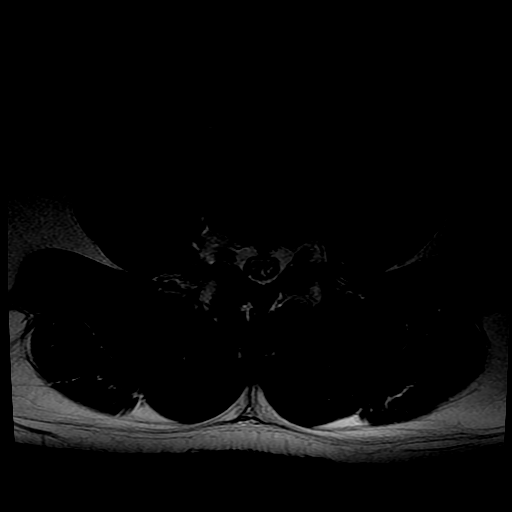
[im 18/40]
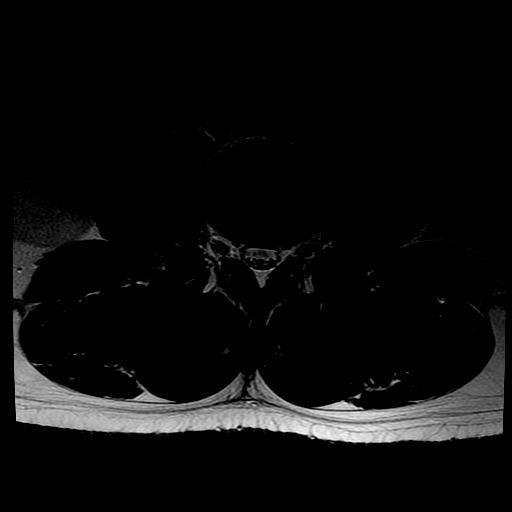
[im 20/40]
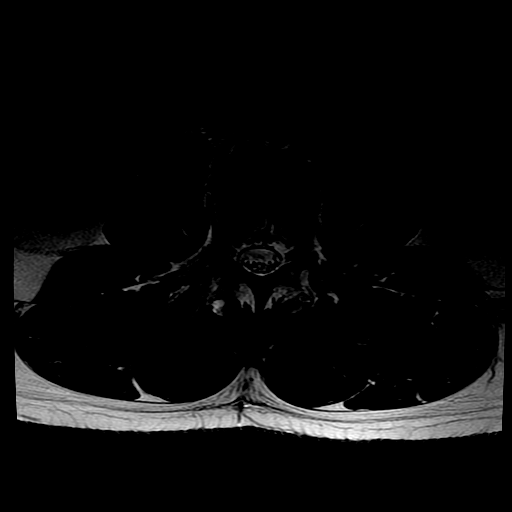
[im 22/40]
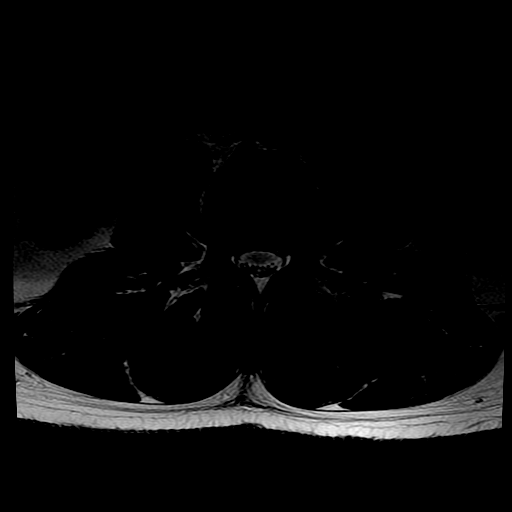
[im 35/40]
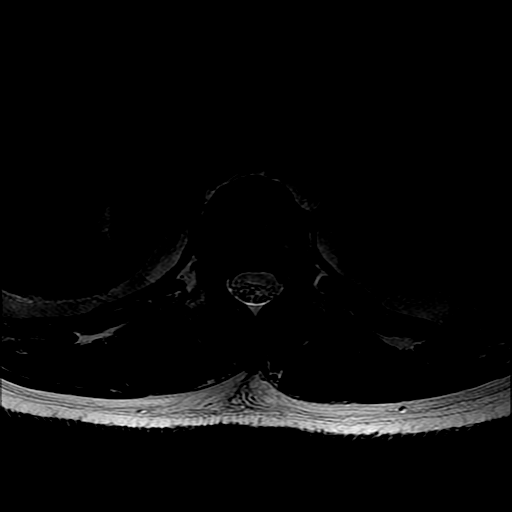

[Series 6: T1 · axial · 4.0mm · 0.39mm/px · z∈[-133,+44]mm · 3 of 40 slices shown (2 of 2)]
[im 5/40]
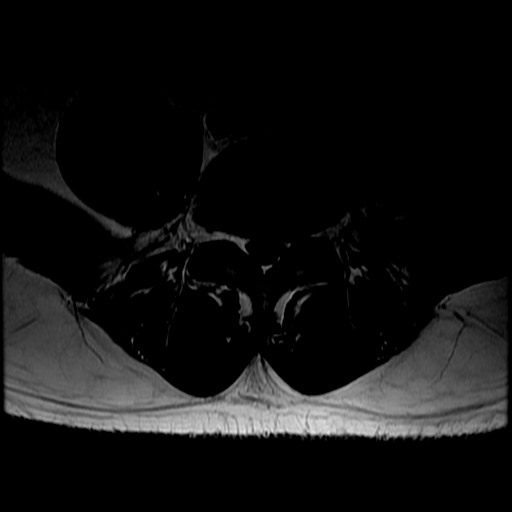
[im 20/40]
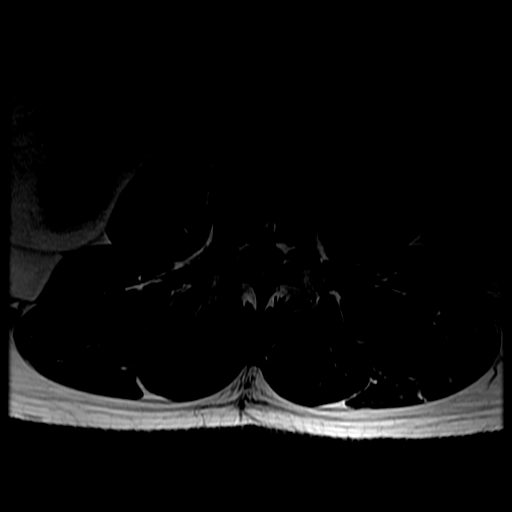
[im 35/40]
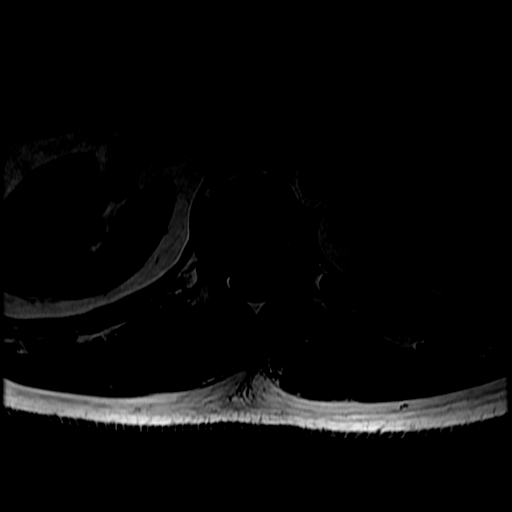

[19 of 48 positions shown; findings below may reference images not displayed]

FINDINGS: For the purposes of this dictation, the lowest well-formed
intervertebral disc space is presumed to be L5-S1.

Grade 1 retrolisthesis of L5 on S1 measures approximately 4 mm.
Vertebral body heights are preserved. Disc desiccation and
mild-to-moderate disc space narrowing are present at L5-S1.
Intervertebral discs elsewhere are well hydrated and maintained in
height. No vertebral marrow edema is seen. Mild type 2 degenerative
endplate changes are noted anteriorly at T11-12. Multiple small
Schmorl's nodes are present. Conus medullaris is normal in signal
and terminates at T12-L1. Paraspinal soft tissues are unremarkable.

T12-L1 through L3-4:  Negative.

L4-5: Moderate-sized right extraforaminal disc extrusion posteriorly
displaces the right L4 nerve root and results in mild right
foraminal stenosis. The spinal canal is mildly narrow on a
congenital basis without significant acquired stenosis. There is
mild facet hypertrophy.

L5-S1: Broad central disc protrusion and mild facet hypertrophy
result in mild left lateral recess stenosis and mild left neural
foraminal stenosis. Disc may contact both S1 nerve roots in the
lateral recesses. No significant spinal stenosis.
IMPRESSION: 1. Right extraforaminal disc extrusion at L4-5 affecting the right
L4 nerve root.
2. Broad central protrusion at L5-S1 with mild left lateral recess
and left neural foraminal stenosis.

## 2016-11-29 DIAGNOSIS — M5416 Radiculopathy, lumbar region: Secondary | ICD-10-CM | POA: Diagnosis not present

## 2016-11-29 DIAGNOSIS — R030 Elevated blood-pressure reading, without diagnosis of hypertension: Secondary | ICD-10-CM | POA: Diagnosis not present

## 2016-11-29 DIAGNOSIS — M545 Low back pain: Secondary | ICD-10-CM | POA: Diagnosis not present

## 2016-11-29 DIAGNOSIS — Z6832 Body mass index (BMI) 32.0-32.9, adult: Secondary | ICD-10-CM | POA: Diagnosis not present

## 2016-12-27 DIAGNOSIS — R030 Elevated blood-pressure reading, without diagnosis of hypertension: Secondary | ICD-10-CM | POA: Diagnosis not present

## 2016-12-27 DIAGNOSIS — Z6832 Body mass index (BMI) 32.0-32.9, adult: Secondary | ICD-10-CM | POA: Diagnosis not present

## 2016-12-27 DIAGNOSIS — M5416 Radiculopathy, lumbar region: Secondary | ICD-10-CM | POA: Diagnosis not present

## 2016-12-27 DIAGNOSIS — M545 Low back pain: Secondary | ICD-10-CM | POA: Diagnosis not present

## 2017-01-18 ENCOUNTER — Ambulatory Visit (INDEPENDENT_AMBULATORY_CARE_PROVIDER_SITE_OTHER): Payer: BLUE CROSS/BLUE SHIELD | Admitting: Internal Medicine

## 2017-01-18 ENCOUNTER — Encounter: Payer: Self-pay | Admitting: Internal Medicine

## 2017-01-18 VITALS — BP 142/90 | HR 90 | Ht 72.0 in | Wt 241.6 lb

## 2017-01-18 DIAGNOSIS — I10 Essential (primary) hypertension: Secondary | ICD-10-CM

## 2017-01-18 DIAGNOSIS — E785 Hyperlipidemia, unspecified: Secondary | ICD-10-CM

## 2017-01-18 MED ORDER — TRIAMTERENE-HCTZ 37.5-25 MG PO TABS: 0.5000 | ORAL_TABLET | Freq: Every day | ORAL | 3 refills | Status: DC

## 2017-01-18 MED ORDER — NITROGLYCERIN 0.4 MG SL SUBL: 0.4000 mg | SUBLINGUAL_TABLET | SUBLINGUAL | 3 refills | Status: DC | PRN

## 2017-01-18 MED ORDER — ATORVASTATIN CALCIUM 40 MG PO TABS
40.0000 mg | ORAL_TABLET | Freq: Every day | ORAL | 3 refills | Status: DC
Start: 2017-01-18 — End: 2019-04-20

## 2017-01-18 MED ORDER — FENOFIBRATE 145 MG PO TABS
145.0000 mg | ORAL_TABLET | Freq: Every day | ORAL | 3 refills | Status: DC
Start: 2017-01-18 — End: 2019-04-20

## 2017-01-18 NOTE — Patient Instructions (Addendum)
Your physician recommends that you continue on your current medications as directed. Please refer to the Current Medication list given to you today. Your physician recommends that you return for lab work in: today (LIPIDS)  

## 2017-01-18 NOTE — Progress Notes (Signed)
Cardiology Office Note   Date:  01/18/2017   ID:  Dewane Timson, DOB 1970/12/30, MRN [id number]  PCP:  Haywood Pao, MD  Cardiologist:   Dorris Carnes, MD    F/U of hyperlipidemia and BP       History of Present Illness: Luke Wong is a 46 y.o. male with a history of hyperlipidemia and a very strong family history of CAD (no male lived after 71 due to heart problems)  Note that CT Ca score was 0 in past \ Since I saw him he continues to have problem with back pain    Limits his actvity  He denies CP  Breathing is OK        Outpatient Medications Prior to Visit  Medication Sig Dispense Refill  . ANDROGEL PUMP 20.25 MG/ACT (1.62%) GEL Inject 1 application into the skin daily.   0  . aspirin 81 MG tablet Take 81 mg by mouth daily.    Marland Kitchen atorvastatin (LIPITOR) 40 MG tablet Take 1 tablet (40 mg total) by mouth daily. 90 tablet 1  . cetirizine (ZYRTEC) 10 MG tablet Take 10 mg by mouth daily.    . DULoxetine (CYMBALTA) 60 MG capsule Take 60 mg by mouth daily.     . fenofibrate (TRICOR) 145 MG tablet TAKE 1 TABLET BY MOUTH DAILY 90 tablet 1  . finasteride (PROPECIA) 1 MG tablet Take 1 mg by mouth daily.    Marland Kitchen HYDROcodone-homatropine (HYCODAN) 5-1.5 MG/5ML syrup Take 5 mLs by mouth every 6 (six) hours as needed for cough. 240 mL 0  . meloxicam (MOBIC) 7.5 MG tablet Take 7.5 mg by mouth 2 (two) times daily.    . nitroGLYCERIN (NITROSTAT) 0.4 MG SL tablet Place 1 tablet (0.4 mg total) under the tongue every 5 (five) minutes as needed. 25 tablet 3  . predniSONE (DELTASONE) 10 MG tablet Take  4 each am x 2 days,   2 each am x 2 days,  1 each am x 2 days and stop 14 tablet 0  . triamterene-hydrochlorothiazide (MAXZIDE-25) 37.5-25 MG tablet TAKE ONE-HALF TABLET BY MOUTH DAILY 45 tablet 1   No facility-administered medications prior to visit.      Allergies:   Demerol [meperidine]   Past Medical History:  Diagnosis Date  . Asthma   . Depression   . GERD (gastroesophageal reflux  disease)   . History of CT scan of chest    a. Notes indicate Cardiac CT with Ca score 0  . History of Doppler ultrasound    a. Carotid US 12/12 done in [place], [place] - Unremarkable; < 50% bilaterally  . History of stress test    a. ETT-Echo 12/12 done in [place], [place] - Normal, Ex 11'14"; non-specific ST changes  /  b. Myoview 8/17: EF 54%, anteroseptal defect-likely artifact, no ischemia, low risk  . Hyperlipidemia   . Pneumonia 2015    Past Surgical History:  Procedure Laterality Date  . ANKLE SURGERY Left 06/07/13, 3/16   Arthroscopic debridement  x2  . HERNIA REPAIR Bilateral 1994  . LUMBAR LAMINECTOMY/ DECOMPRESSION WITH MET-RX Right 11/30/2014   Procedure: Right L4-5 Far Lateral Microdiskectomy w/ Metrx;  Surgeon: Kevan Ny Ditty, MD;  Location: [facility];  Service: Neurosurgery;  Laterality: Right;  Right L4-5 Far Lateral Microdiskectomy     Social History:  The patient  reports that he has never smoked. He has never used smokeless tobacco. He reports that he drinks alcohol. He reports that he does not  use drugs.   Family History:  The patient's family history includes Heart attack in his father and paternal grandfather; Heart disease in his father; Thyroid cancer in his mother.    ROS:  Please see the history of present illness. All other systems are reviewed and  Negative to the above problem except as noted.    PHYSICAL EXAM: VS:  BP (!) 142/90   Pulse 90   Ht 6' (1.829 m)   Wt 241 lb 9.6 oz (109.6 kg)   SpO2 97%   BMI 32.77 kg/m     GEN:  Obese 46 yo in NAD   HEENT: normal  Neck: no JVD, carotid bruits, or masses Cardiac: RRR; no murmurs, rubs, or gallops,no edema  Respiratory:  clear to auscultation bilaterally, normal work of breathing GI: soft, nontender, nondistended, + BS  No hepatomegaly  MS: no deformity Moving all extremities   Skin: warm and dry, no rash Neuro:  Strength and sensation are intact Psych: euthymic mood, full affect   EKG:  EKG is   ordered today.  SR 85 bpm   Lipid Panel    Component Value Date/Time   CHOL 139 11/25/2015 0917   TRIG 235 (H) 11/25/2015 0917   HDL 20 (L) 11/25/2015 0917   CHOLHDL 7.0 (H) 11/25/2015 0917   VLDL 47 (H) 11/25/2015 0917   LDLCALC 72 11/25/2015 0917   LDLDIRECT 113.0 09/16/2014 0814      Wt Readings from Last 3 Encounters:  01/18/17 241 lb 9.6 oz (109.6 kg)  05/17/16 237 lb (107.5 kg)  12/27/15 248 lb 6.4 oz (112.7 kg)      ASSESSMENT AND PLAN:   1.  HL  Will set up for lipids     2.  HTN  BP is high today  He is in pain  I have encouraged him to check BP at home regularly  I would not change meds for now.  May need to increase mazide   Signed, Dorris Carnes, MD  01/18/2017 4:17 PM    [facility] [address], [place], [place]  [address] Phone: [phone]; Fax: [phone]

## 2017-01-19 LAB — LIPID PANEL
Chol/HDL Ratio: 5 ratio (ref 0.0–5.0)
Cholesterol, Total: 165 mg/dL (ref 100–199)
HDL: 33 mg/dL — ABNORMAL LOW (ref >39)
Triglycerides: 572 mg/dL (ref 0–149)

## 2017-01-19 LAB — BASIC METABOLIC PANEL
BUN/Creatinine Ratio: 14 (ref 9–20)
BUN: 17 mg/dL (ref 6–24)
CO2: 25 mmol/L (ref 20–29)
Calcium: 9.4 mg/dL (ref 8.7–10.2)
Chloride: 99 mmol/L (ref 96–106)
Creatinine, Ser: 1.2 mg/dL (ref 0.76–1.27)
GFR calc Af Amer: 85 mL/min/{1.73_m2} (ref >59)
GFR calc non Af Amer: 73 mL/min/{1.73_m2} (ref >59)
Glucose: 105 mg/dL — ABNORMAL HIGH (ref 65–99)
Potassium: 4.5 mmol/L (ref 3.5–5.2)
Sodium: 141 mmol/L (ref 134–144)

## 2017-01-19 LAB — CBC
Hematocrit: 41.4 % (ref 37.5–51.0)
Hemoglobin: 13.7 g/dL (ref 13.0–17.7)
MCH: 27.8 pg (ref 26.6–33.0)
MCHC: 33.1 g/dL (ref 31.5–35.7)
MCV: 84 fL (ref 79–97)
Platelets: 334 10*3/uL (ref 150–379)
RBC: 4.92 x10E6/uL (ref 4.14–5.80)
RDW: 14.1 % (ref 12.3–15.4)
WBC: 7.8 10*3/uL (ref 3.4–10.8)

## 2017-01-29 ENCOUNTER — Telehealth: Payer: Self-pay | Admitting: Internal Medicine

## 2017-01-29 NOTE — Telephone Encounter (Signed)
Luke Wong is returning your call . Thanks

## 2017-01-29 NOTE — Telephone Encounter (Signed)
Reviewed lab results.  Please see labs for detail.

## 2017-02-01 ENCOUNTER — Other Ambulatory Visit: Payer: Self-pay | Admitting: *Deleted

## 2017-02-01 DIAGNOSIS — E785 Hyperlipidemia, unspecified: Secondary | ICD-10-CM

## 2017-03-21 ENCOUNTER — Ambulatory Visit: Payer: BLUE CROSS/BLUE SHIELD | Admitting: Adult Health

## 2017-03-21 ENCOUNTER — Encounter: Payer: Self-pay | Admitting: Adult Health

## 2017-03-21 DIAGNOSIS — R05 Cough: Secondary | ICD-10-CM

## 2017-03-21 DIAGNOSIS — R058 Other specified cough: Secondary | ICD-10-CM

## 2017-03-21 MED ORDER — AZITHROMYCIN 250 MG PO TABS: ORAL_TABLET | ORAL | 0 refills | Status: AC

## 2017-03-21 MED ORDER — HYDROCODONE-HOMATROPINE 5-1.5 MG/5ML PO SYRP: 5.0000 mL | ORAL_SOLUTION | Freq: Four times a day (QID) | ORAL | 0 refills | Status: DC | PRN

## 2017-03-21 MED ORDER — BENZONATATE 200 MG PO CAPS: 200.0000 mg | ORAL_CAPSULE | Freq: Three times a day (TID) | ORAL | 1 refills | Status: DC | PRN

## 2017-03-21 MED ORDER — PREDNISONE 10 MG PO TABS: ORAL_TABLET | ORAL | 0 refills | Status: DC

## 2017-03-21 NOTE — Assessment & Plan Note (Signed)
Flare with URI  Advised caution with narcotic cough syrup with pt education .   Plan  Patient Instructions  Zpack to have on hold if symptoms worsen with discolored mucus.  Prednisone taper over next wee..  Begin Delsym 2 teaspoons twice daily Begin Tessalon perles 1 Three times a day   May use Hydromet 1-2 teaspoons every 6 hours as needed for cough control, may make you sleepy- do not take other narcotic Begin Zantac 150mg  Twice daily  Until cough has resolved. Use sips of water to help avoid coughing and throat clearing Use sugarless candy to help with cough Avoid all mint products Follow up with Dr. Sherene SiresWert  In 3 months and As needed   Please contact office for sooner follow up if symptoms do not improve or worsen or seek emergency care

## 2017-03-21 NOTE — Progress Notes (Signed)
 @Patient  ID: Luke Wong, male    DOB: 02/14/1971, 46 y.o.   MRN: [id number]  Chief Complaint  Patient presents with  . Acute Visit    Cough     Referring provider: Gaspar Garbeisovec, Richard W, MD  HPI: 46 year old male never smoker, followed for chronic rhinitis , and cough   TEST  rx singulair 07/18/2013 > improved 08/21/13 > try off singulair 03/02/14 as pt not convinced helped - Sinus CT 08/28/13 > Sinuses are essentially clear with only minor dependent secretions noted in the inferior right frontal and left maxillary sinuses. There is anatomic narrowing of the ostiomeatal complexes, incompletely evaluated on this limited sinus study. - 02/26/2014 refer to ENT ([facility])> neg sinus dz/ pos ET dysfunction rec flonase  -03/26/14  Allergy profile:  eos 0,  IGgE 87 Dog=cat, dust, ragweed  - 04/19/14  PFTs wnl including fef 25-75 off singulair and all bronchodilators  - NO 04/14/2015 during flare = 26  - Methacholine challenge 05/05/14 > neg for asthma  03/21/2017 Acute OV : Cough  Pt presents for an acute office visit . Complains of 2 days of barking cough , drainage and minimal congestion . Cough is keeping him up at night . Appetite is slightly down . No nv/d. No fevers, chest pain, orthopnea, edema . Wife has been sick with similar symptoms. Had flu shot 1 months ago.  Has a big family party next week . Says he has 70 people coming and does not want to be sick .  Has taken tylenol only .   No recent abx use.    Allergies  Allergen Reactions  . Demerol [Meperidine]     unknown    Immunization History  Administered Date(s) Administered  . Influenza Split 01/24/2014, 01/29/2015, 01/22/2016, 02/18/2017  . Td 04/01/2011    Past Medical History:  Diagnosis Date  . Asthma   . Depression   . GERD (gastroesophageal reflux disease)   . History of CT scan of chest    a. Notes indicate Cardiac CT with Ca score 0  . History of Doppler ultrasound    a. Carotid US 12/12 done in [place], [place] -  Unremarkable; < 50% bilaterally  . History of stress test    a. ETT[place]Echo 12/12 [place] - Normal, Ex 11'14"; non-specific ST changes  /  b. Myoview 8/17: EF 54%, anteroseptal defect-likely artifact, no ischemia, low risk  . Hyperlipidemia   . Pneumonia 2015    Tobacco History: Social History   Tobacco Use  Smoking Status Never Smoker  Smokeless Tobacco Never Used   Counseling given: Not Answered   Outpatient Encounter Medications as of 03/21/2017  Medication Sig  . ANDROGEL PUMP 20.25 MG/ACT (1.62%) GEL Inject 1 application into the skin daily.   Marland Kitchen. aspirin 81 MG tablet Take 81 mg by mouth daily.  Marland Kitchen. atorvastatin (LIPITOR) 40 MG tablet Take 1 tablet (40 mg total) by mouth daily.  Marland Kitchen. azithromycin (ZITHROMAX Z-PAK) 250 MG tablet Take 2 tablets (500 mg) on  Day 1,  followed by 1 tablet (250 mg) once daily on Days 2 through 5.  . benzonatate (TESSALON) 200 MG capsule Take 1 capsule (200 mg total) by mouth 3 (three) times daily as needed for cough.  . cetirizine (ZYRTEC) 10 MG tablet Take 10 mg by mouth daily.  . DULoxetine (CYMBALTA) 60 MG capsule Take 60 mg by mouth daily.   . fenofibrate (TRICOR) 145 MG tablet Take 1 tablet (145 mg total) by mouth daily.  .Marland Kitchen  finasteride (PROPECIA) 1 MG tablet Take 1 mg by mouth daily.  Marland Kitchen HYDROcodone-homatropine (HYCODAN) 5-1.5 MG/5ML syrup Take 5 mLs by mouth every 6 (six) hours as needed for cough.  . meloxicam (MOBIC) 7.5 MG tablet Take 7.5 mg by mouth 2 (two) times daily.  Marland Kitchen morphine (MS CONTIN) 15 MG 12 hr tablet Take 15 mg by mouth 2 (two) times daily after a meal.  . nitroGLYCERIN (NITROSTAT) 0.4 MG SL tablet Place 1 tablet (0.4 mg total) under the tongue every 5 (five) minutes as needed.  . Oxycodone HCl 10 MG TABS Take 10 mg by mouth 3 (three) times daily. Take 2 tab po every eight hours, nte three tabs per day.  . predniSONE (DELTASONE) 10 MG tablet 4 tabs for 2 days, then 3 tabs for 2 days, 2 tabs for 2 days, then 1 tab for 2 days,  then stop  . traZODone (DESYREL) 100 MG tablet Take 100 mg by mouth daily. Take 100 mg by mouth daily.  Marland Kitchen triamterene-hydrochlorothiazide (MAXZIDE-25) 37.5-25 MG tablet Take 0.5 tablets by mouth daily.   No facility-administered encounter medications on file as of 03/21/2017.      Review of Systems  Constitutional:   No  weight loss, night sweats,  Fevers, chills, fatigue, or  lassitude.  HEENT:   No headaches,  Difficulty swallowing,  Tooth/dental problems, or  Sore throat,                No sneezing, itching, ear ache,  +nasal congestion, post nasal drip,   CV:  No chest pain,  Orthopnea, PND, swelling in lower extremities, anasarca, dizziness, palpitations, syncope.   GI  No heartburn, indigestion, abdominal pain, nausea, vomiting, diarrhea, change in bowel habits, loss of appetite, bloody stools.   Resp:   No chest wall deformity  Skin: no rash or lesions.  GU: no dysuria, change in color of urine, no urgency or frequency.  No flank pain, no hematuria   MS:  No joint pain or swelling.  No decreased range of motion.  No back pain.    Physical Exam  BP 134/70 (BP Location: Left Arm, Cuff Size: Normal)   Pulse 90   Temp 99.1 F (37.3 C) (Oral)   Ht 6' (1.829 m)   Wt 236 lb 3.2 oz (107.1 kg)   SpO2 100%   BMI 32.03 kg/m   GEN: A/Ox3; pleasant , NAD, well nourished    HEENT:  [place]/AT,  EACs-R- Tube  TMs-wnl, NOSE-clear drainage ,  THROAT-clear, no lesions, no postnasal drip or exudate noted.   NECK:  Supple w/ fair ROM; no JVD; normal carotid impulses w/o bruits; no thyromegaly or nodules palpated; no lymphadenopathy.    RESP  Clear  P & A; w/o, wheezes/ rales/ or rhonchi. no accessory muscle use, no dullness to percussion  CARD:  RRR, no m/r/g, no peripheral edema, pulses intact, no cyanosis or clubbing.  GI:   Soft & nt; nml bowel sounds; no organomegaly or masses detected.   Musco: Warm bil, no deformities or joint swelling noted.   Neuro: alert, no focal  deficits noted.    Skin: Warm, no lesions or rashes    Lab Results:    BNP No results found for: BNP  ProBNP No results found for: PROBNP  Imaging: No results found.   Assessment & Plan:   Upper airway cough syndrome Flare with URI  Advised caution with narcotic cough syrup with pt education .   Plan  Patient Instructions  Zpack to have on hold if symptoms worsen with discolored mucus.  Prednisone taper over next wee..  Begin Delsym 2 teaspoons twice daily Begin Tessalon perles 1 Three times a day   May use Hydromet 1-2 teaspoons every 6 hours as needed for cough control, may make you sleepy- do not take other narcotic Begin Zantac 150mg  Twice daily  Until cough has resolved. Use sips of water to help avoid coughing and throat clearing Use sugarless candy to help with cough Avoid all mint products Follow up with Dr. Sherene SiresWert  In 3 months and As needed   Please contact office for sooner follow up if symptoms do not improve or worsen or seek emergency care           Rubye Oaksammy Yaritzy Huser, NP 03/21/2017

## 2017-03-21 NOTE — Patient Instructions (Addendum)
Zpack to have on hold if symptoms worsen with discolored mucus.  Prednisone taper over next wee..  Begin Delsym 2 teaspoons twice daily Begin Tessalon perles 1 Three times a day   May use Hydromet 1-2 teaspoons every 6 hours as needed for cough control, may make you sleepy- do not take other narcotic Begin Zantac 150mg  Twice daily  Until cough has resolved. Use sips of water to help avoid coughing and throat clearing Use sugarless candy to help with cough Avoid all mint products Follow up with Dr. Sherene SiresWert  In 3 months and As needed   Please contact office for sooner follow up if symptoms do not improve or worsen or seek emergency care

## 2017-03-21 NOTE — Progress Notes (Signed)
Chart and office note reviewed in detail  > agree with a/p as outlined    

## 2017-03-26 DIAGNOSIS — R030 Elevated blood-pressure reading, without diagnosis of hypertension: Secondary | ICD-10-CM | POA: Diagnosis not present

## 2017-03-26 DIAGNOSIS — M5416 Radiculopathy, lumbar region: Secondary | ICD-10-CM | POA: Diagnosis not present

## 2017-03-26 DIAGNOSIS — Z6832 Body mass index (BMI) 32.0-32.9, adult: Secondary | ICD-10-CM | POA: Diagnosis not present

## 2017-03-26 DIAGNOSIS — M545 Low back pain: Secondary | ICD-10-CM | POA: Diagnosis not present

## 2017-04-26 DIAGNOSIS — M9902 Segmental and somatic dysfunction of thoracic region: Secondary | ICD-10-CM | POA: Diagnosis not present

## 2017-04-26 DIAGNOSIS — M9901 Segmental and somatic dysfunction of cervical region: Secondary | ICD-10-CM | POA: Diagnosis not present

## 2017-04-26 DIAGNOSIS — M62830 Muscle spasm of back: Secondary | ICD-10-CM | POA: Diagnosis not present

## 2017-04-26 DIAGNOSIS — M546 Pain in thoracic spine: Secondary | ICD-10-CM | POA: Diagnosis not present

## 2017-05-01 DIAGNOSIS — M546 Pain in thoracic spine: Secondary | ICD-10-CM | POA: Diagnosis not present

## 2017-05-01 DIAGNOSIS — M62830 Muscle spasm of back: Secondary | ICD-10-CM | POA: Diagnosis not present

## 2017-05-01 DIAGNOSIS — M9902 Segmental and somatic dysfunction of thoracic region: Secondary | ICD-10-CM | POA: Diagnosis not present

## 2017-05-01 DIAGNOSIS — M9901 Segmental and somatic dysfunction of cervical region: Secondary | ICD-10-CM | POA: Diagnosis not present

## 2017-05-02 DIAGNOSIS — M9902 Segmental and somatic dysfunction of thoracic region: Secondary | ICD-10-CM | POA: Diagnosis not present

## 2017-05-02 DIAGNOSIS — M62830 Muscle spasm of back: Secondary | ICD-10-CM | POA: Diagnosis not present

## 2017-05-02 DIAGNOSIS — M546 Pain in thoracic spine: Secondary | ICD-10-CM | POA: Diagnosis not present

## 2017-05-02 DIAGNOSIS — M9901 Segmental and somatic dysfunction of cervical region: Secondary | ICD-10-CM | POA: Diagnosis not present

## 2017-05-07 DIAGNOSIS — M62830 Muscle spasm of back: Secondary | ICD-10-CM | POA: Diagnosis not present

## 2017-05-07 DIAGNOSIS — M546 Pain in thoracic spine: Secondary | ICD-10-CM | POA: Diagnosis not present

## 2017-05-07 DIAGNOSIS — M9901 Segmental and somatic dysfunction of cervical region: Secondary | ICD-10-CM | POA: Diagnosis not present

## 2017-05-07 DIAGNOSIS — M9902 Segmental and somatic dysfunction of thoracic region: Secondary | ICD-10-CM | POA: Diagnosis not present

## 2017-05-08 DIAGNOSIS — M546 Pain in thoracic spine: Secondary | ICD-10-CM | POA: Diagnosis not present

## 2017-05-08 DIAGNOSIS — M9902 Segmental and somatic dysfunction of thoracic region: Secondary | ICD-10-CM | POA: Diagnosis not present

## 2017-05-08 DIAGNOSIS — M9901 Segmental and somatic dysfunction of cervical region: Secondary | ICD-10-CM | POA: Diagnosis not present

## 2017-05-08 DIAGNOSIS — M62830 Muscle spasm of back: Secondary | ICD-10-CM | POA: Diagnosis not present

## 2017-05-09 DIAGNOSIS — M9901 Segmental and somatic dysfunction of cervical region: Secondary | ICD-10-CM | POA: Diagnosis not present

## 2017-05-09 DIAGNOSIS — M9902 Segmental and somatic dysfunction of thoracic region: Secondary | ICD-10-CM | POA: Diagnosis not present

## 2017-05-09 DIAGNOSIS — M546 Pain in thoracic spine: Secondary | ICD-10-CM | POA: Diagnosis not present

## 2017-05-09 DIAGNOSIS — M62830 Muscle spasm of back: Secondary | ICD-10-CM | POA: Diagnosis not present

## 2017-05-13 DIAGNOSIS — M9902 Segmental and somatic dysfunction of thoracic region: Secondary | ICD-10-CM | POA: Diagnosis not present

## 2017-05-13 DIAGNOSIS — M9901 Segmental and somatic dysfunction of cervical region: Secondary | ICD-10-CM | POA: Diagnosis not present

## 2017-05-13 DIAGNOSIS — M546 Pain in thoracic spine: Secondary | ICD-10-CM | POA: Diagnosis not present

## 2017-05-13 DIAGNOSIS — M62830 Muscle spasm of back: Secondary | ICD-10-CM | POA: Diagnosis not present

## 2017-05-14 DIAGNOSIS — M546 Pain in thoracic spine: Secondary | ICD-10-CM | POA: Diagnosis not present

## 2017-05-14 DIAGNOSIS — M62830 Muscle spasm of back: Secondary | ICD-10-CM | POA: Diagnosis not present

## 2017-05-14 DIAGNOSIS — M9901 Segmental and somatic dysfunction of cervical region: Secondary | ICD-10-CM | POA: Diagnosis not present

## 2017-05-14 DIAGNOSIS — M9902 Segmental and somatic dysfunction of thoracic region: Secondary | ICD-10-CM | POA: Diagnosis not present

## 2017-05-16 DIAGNOSIS — M9902 Segmental and somatic dysfunction of thoracic region: Secondary | ICD-10-CM | POA: Diagnosis not present

## 2017-05-16 DIAGNOSIS — M546 Pain in thoracic spine: Secondary | ICD-10-CM | POA: Diagnosis not present

## 2017-05-16 DIAGNOSIS — M62830 Muscle spasm of back: Secondary | ICD-10-CM | POA: Diagnosis not present

## 2017-05-16 DIAGNOSIS — M9901 Segmental and somatic dysfunction of cervical region: Secondary | ICD-10-CM | POA: Diagnosis not present

## 2017-05-21 DIAGNOSIS — M9901 Segmental and somatic dysfunction of cervical region: Secondary | ICD-10-CM | POA: Diagnosis not present

## 2017-05-21 DIAGNOSIS — M9902 Segmental and somatic dysfunction of thoracic region: Secondary | ICD-10-CM | POA: Diagnosis not present

## 2017-05-21 DIAGNOSIS — M546 Pain in thoracic spine: Secondary | ICD-10-CM | POA: Diagnosis not present

## 2017-05-21 DIAGNOSIS — M62830 Muscle spasm of back: Secondary | ICD-10-CM | POA: Diagnosis not present

## 2017-05-22 DIAGNOSIS — M9901 Segmental and somatic dysfunction of cervical region: Secondary | ICD-10-CM | POA: Diagnosis not present

## 2017-05-22 DIAGNOSIS — M62830 Muscle spasm of back: Secondary | ICD-10-CM | POA: Diagnosis not present

## 2017-05-22 DIAGNOSIS — M546 Pain in thoracic spine: Secondary | ICD-10-CM | POA: Diagnosis not present

## 2017-05-22 DIAGNOSIS — M9902 Segmental and somatic dysfunction of thoracic region: Secondary | ICD-10-CM | POA: Diagnosis not present

## 2017-05-23 DIAGNOSIS — M9901 Segmental and somatic dysfunction of cervical region: Secondary | ICD-10-CM | POA: Diagnosis not present

## 2017-05-23 DIAGNOSIS — M62830 Muscle spasm of back: Secondary | ICD-10-CM | POA: Diagnosis not present

## 2017-05-23 DIAGNOSIS — M9902 Segmental and somatic dysfunction of thoracic region: Secondary | ICD-10-CM | POA: Diagnosis not present

## 2017-05-23 DIAGNOSIS — M546 Pain in thoracic spine: Secondary | ICD-10-CM | POA: Diagnosis not present

## 2017-05-28 DIAGNOSIS — M62830 Muscle spasm of back: Secondary | ICD-10-CM | POA: Diagnosis not present

## 2017-05-28 DIAGNOSIS — M546 Pain in thoracic spine: Secondary | ICD-10-CM | POA: Diagnosis not present

## 2017-05-28 DIAGNOSIS — M9901 Segmental and somatic dysfunction of cervical region: Secondary | ICD-10-CM | POA: Diagnosis not present

## 2017-05-28 DIAGNOSIS — M9902 Segmental and somatic dysfunction of thoracic region: Secondary | ICD-10-CM | POA: Diagnosis not present

## 2017-05-29 DIAGNOSIS — M9902 Segmental and somatic dysfunction of thoracic region: Secondary | ICD-10-CM | POA: Diagnosis not present

## 2017-05-29 DIAGNOSIS — M62830 Muscle spasm of back: Secondary | ICD-10-CM | POA: Diagnosis not present

## 2017-05-29 DIAGNOSIS — M9901 Segmental and somatic dysfunction of cervical region: Secondary | ICD-10-CM | POA: Diagnosis not present

## 2017-05-29 DIAGNOSIS — M546 Pain in thoracic spine: Secondary | ICD-10-CM | POA: Diagnosis not present

## 2017-05-30 DIAGNOSIS — M9902 Segmental and somatic dysfunction of thoracic region: Secondary | ICD-10-CM | POA: Diagnosis not present

## 2017-05-30 DIAGNOSIS — M62830 Muscle spasm of back: Secondary | ICD-10-CM | POA: Diagnosis not present

## 2017-05-30 DIAGNOSIS — M9901 Segmental and somatic dysfunction of cervical region: Secondary | ICD-10-CM | POA: Diagnosis not present

## 2017-05-30 DIAGNOSIS — M546 Pain in thoracic spine: Secondary | ICD-10-CM | POA: Diagnosis not present

## 2017-06-08 NOTE — Progress Notes (Signed)
[facility] [address], [place] [address] Phone: [phone] Subjective:      CC: Back and ankle pain   BJY:NWGNFAOZHY  Luke Wong is a 47 y.o. male coming in with complaint of back and ankle pain. He tore two tendons in the left ankle and had 2 surgeries on that ankle. He believes that his ankle pain caused his back pain due to a change in gait. He has a limited range of motion in that ankle. He has intermittent pain with no pattern.   Back pain-history of  L4 vertebrae 2016.  Had an L4-L5 microdiscectomy 2 years ago. Patient has been in constant pain and numbness of the right leg. He describes it as getting hit with a bat. He does have pain on the left side of back but mostly on the right. He has tried narcotic medications that do cut the pain.  Patient does pain management specialist.  On OxyContin as well as oxycodone.   Ankle pain    Left-sided.  Seems that the ankle started before the back.  Worsening symptoms overall.  States that the severe overall.  Feels that the ankle pain started the back pain.  Sometimes has numbness that is chronic. X-rays of the left ankle were independently visualized by me.  No significant bony abnormality for potential osteopenia patient states that it is sore all the time.  States that sometimes it seems to be swollen.  Has been seeing a chiropractor with no benefit.  Was put in custom orthotics recently with no significant benefit either.  Past Medical History:  Diagnosis Date  . Asthma   . Depression   . GERD (gastroesophageal reflux disease)   . History of CT scan of chest    a. Notes indicate Cardiac CT with Ca score 0  . History of Doppler ultrasound    a. Carotid US 12/12 done in [place], [place] - Unremarkable; < 50% bilaterally  . History of stress test    a. ETT-Echo 12/12 done in [place], [place] - Normal, Ex 11'14"; non-specific ST changes  /  b. Myoview 8/17: EF 54%, anteroseptal defect-likely artifact, no  ischemia, low risk  . Hyperlipidemia   . Pneumonia 2015   Past Surgical History:  Procedure Laterality Date  . ANKLE SURGERY Left 06/07/13, 3/16   Arthroscopic debridement  x2  . HERNIA REPAIR Bilateral 1994  . LUMBAR LAMINECTOMY/ DECOMPRESSION WITH MET-RX Right 11/30/2014   Procedure: Right L4-5 Far Lateral Microdiskectomy w/ Metrx;  Surgeon: Kevan Ny Ditty, MD;  Location: [facility];  Service: Neurosurgery;  Laterality: Right;  Right L4-5 Far Lateral Microdiskectomy   Social History   Socioeconomic History  . Marital status: Married    Spouse name: None  . Number of children: None  . Years of education: None  . Highest education level: None  Social Needs  . Financial resource strain: None  . Food insecurity - worry: None  . Food insecurity - inability: None  . Transportation needs - medical: None  . Transportation needs - non-medical: None  Occupational History  . Occupation: Attorney  Tobacco Use  . Smoking status: Never Smoker  . Smokeless tobacco: Never Used  Substance and Sexual Activity  . Alcohol use: Yes    Comment: once per wk 0- 3 wine, beer, liquor  . Drug use: No  . Sexual activity: None  Other Topics Concern  . None  Social History Narrative  . None   Allergies  Allergen  Reactions  . Demerol [Meperidine]     unknown   Family History  Problem Relation Age of Onset  . Thyroid cancer Mother   . Heart disease Father   . Heart attack Father   . Heart attack Paternal Grandfather      Past medical history, social, surgical and family history all reviewed in electronic medical record.  No pertanent information unless stated regarding to the chief complaint.   Review of Systems:Review of systems updated and as accurate as of 06/10/17  No headache, visual changes, nausea, vomiting, diarrhea, constipation, dizziness, abdominal pain, skin rash, fevers, chills, night sweats, weight loss, swollen lymph nodes,  chest pain, shortness of breath, mood  changes.  Positive body aches and muscle aches  Objective  Blood pressure 108/80, pulse 87, height 6' (1.829 m), weight 242 lb (109.8 kg), SpO2 97 %. Systems examined below as of 06/10/17   General: No apparent distress alert and oriented x3 mood and affect normal, dressed appropriately.  HEENT: Pupils equal, extraocular movements intact  Respiratory: Patient's speak in full sentences and does not appear short of breath  Cardiovascular: No lower extremity edema, non tender, no erythema  Skin: Warm dry intact with no signs of infection or rash on extremities or on axial skeleton.  Abdomen: Soft nontender  Neuro: Cranial nerves II through XII are intact, neurovascularly intact in all extremities with 2+ DTRs and 2+ pulses.  Lymph: No lymphadenopathy of posterior or anterior cervical chain or axillae bilaterally.  Gait normal with good balance and coordination.  MSK:  Non tender with full range of motion and good stability and symmetric strength and tone of shoulders, elbows, wrist, hip, knees bilaterally.  Ankle: Left No visible erythema or swelling.  Pes planus bilaterally  range of motion is full in all directions. Strength is 5/5 in all directions. Stable lateral and medial ligaments; squeeze test and kleiger test unremarkable; Talar dome moderately tender.; No pain at base of 5th MT; No tenderness over cuboid; No tenderness over N spot or navicular prominence No tenderness on posterior aspects of lateral and medial malleolus No sign of peroneal tendon subluxations or tenderness to palpation Negative tarsal tunnel tinel's Able to walk 4 steps.  Back Exam:  Inspection: Loss of lordosis Motion: Flexion 35 deg, Extension 20 deg, Side Bending to 45 deg bilaterally,  Rotation to 45 deg bilaterally  SLR laying: Negative  XSLR laying: Negative  Palpable tenderness: Diffuse tenderness to palpation in the paraspinal musculature lumbar spine. FABER: Mild tightness bilaterally. Sensory  change: Gross sensation intact to all lumbar and sacral dermatomes.  Reflexes: 2+ at both patellar tendons, 2+ at achilles tendons, Babinski's downgoing.  Strength at foot  Plantar-flexion: 5/5 Dorsi-flexion: 5/5 Eversion: 5/5 Inversion: 5/5  Leg strength  4 out of 5 but symmetric Gait unremarkable.   Impression and Recommendations:     This case required medical decision making of moderate complexity.      Note: This dictation was prepared with Dragon dictation along with smaller phrase technology. Any transcriptional errors that result from this process are unintentional.

## 2017-06-10 ENCOUNTER — Ambulatory Visit (INDEPENDENT_AMBULATORY_CARE_PROVIDER_SITE_OTHER)
Admission: RE | Admit: 2017-06-10 | Discharge: 2017-06-10 | Disposition: A | Discharge status: Home / Self Care | Payer: BLUE CROSS/BLUE SHIELD | Source: Ambulatory Visit | Attending: Family Medicine | Admitting: Family Medicine

## 2017-06-10 ENCOUNTER — Encounter: Payer: Self-pay | Admitting: Family Medicine

## 2017-06-10 ENCOUNTER — Ambulatory Visit: Payer: BLUE CROSS/BLUE SHIELD | Admitting: Family Medicine

## 2017-06-10 ENCOUNTER — Ambulatory Visit: Payer: Self-pay

## 2017-06-10 ENCOUNTER — Other Ambulatory Visit (INDEPENDENT_AMBULATORY_CARE_PROVIDER_SITE_OTHER): Payer: BLUE CROSS/BLUE SHIELD

## 2017-06-10 ENCOUNTER — Other Ambulatory Visit: Payer: Self-pay

## 2017-06-10 VITALS — BP 108/80 | HR 87 | Ht 72.0 in | Wt 242.0 lb

## 2017-06-10 DIAGNOSIS — M2140 Flat foot [pes planus] (acquired), unspecified foot: Secondary | ICD-10-CM | POA: Insufficient documentation

## 2017-06-10 DIAGNOSIS — M2142 Flat foot [pes planus] (acquired), left foot: Secondary | ICD-10-CM

## 2017-06-10 DIAGNOSIS — M545 Low back pain, unspecified: Secondary | ICD-10-CM

## 2017-06-10 DIAGNOSIS — G8929 Other chronic pain: Secondary | ICD-10-CM | POA: Diagnosis not present

## 2017-06-10 DIAGNOSIS — M2141 Flat foot [pes planus] (acquired), right foot: Secondary | ICD-10-CM

## 2017-06-10 DIAGNOSIS — M25572 Pain in left ankle and joints of left foot: Principal | ICD-10-CM

## 2017-06-10 DIAGNOSIS — M5416 Radiculopathy, lumbar region: Secondary | ICD-10-CM | POA: Diagnosis not present

## 2017-06-10 DIAGNOSIS — M2550 Pain in unspecified joint: Secondary | ICD-10-CM

## 2017-06-10 LAB — CBC WITH DIFFERENTIAL/PLATELET
Basophils Absolute: 0.1 10*3/uL (ref 0.0–0.1)
Basophils Relative: 1.2 % (ref 0.0–3.0)
Eosinophils Absolute: 0.3 10*3/uL (ref 0.0–0.7)
Eosinophils Relative: 4.7 % (ref 0.0–5.0)
HCT: 43.9 % (ref 39.0–52.0)
Hemoglobin: 14.8 g/dL (ref 13.0–17.0)
Lymphocytes Relative: 30.1 % (ref 12.0–46.0)
Lymphs Abs: 1.8 10*3/uL (ref 0.7–4.0)
MCHC: 33.7 g/dL (ref 30.0–36.0)
MCV: 83.3 fl (ref 78.0–100.0)
Monocytes Absolute: 0.6 10*3/uL (ref 0.1–1.0)
Monocytes Relative: 9.2 % (ref 3.0–12.0)
Neutro Abs: 3.3 10*3/uL (ref 1.4–7.7)
Neutrophils Relative %: 54.8 % (ref 43.0–77.0)
Platelets: 325 10*3/uL (ref 150.0–400.0)
RBC: 5.27 Mil/uL (ref 4.22–5.81)
RDW: 13.8 % (ref 11.5–15.5)
WBC: 6.1 10*3/uL (ref 4.0–10.5)

## 2017-06-10 LAB — COMPREHENSIVE METABOLIC PANEL
ALT: 53 U/L (ref 0–53)
AST: 30 U/L (ref 0–37)
Albumin: 4.1 g/dL (ref 3.5–5.2)
Alkaline Phosphatase: 49 U/L (ref 39–117)
BUN: 15 mg/dL (ref 6–23)
CO2: 27 mEq/L (ref 19–32)
Calcium: 9.5 mg/dL (ref 8.4–10.5)
Chloride: 103 mEq/L (ref 96–112)
Creatinine, Ser: 1.03 mg/dL (ref 0.40–1.50)
GFR: 83.03 mL/min (ref >60.00)
Glucose, Bld: 111 mg/dL — ABNORMAL HIGH (ref 70–99)
Potassium: 3.9 mEq/L (ref 3.5–5.1)
Sodium: 138 mEq/L (ref 135–145)
Total Bilirubin: 0.5 mg/dL (ref 0.2–1.2)
Total Protein: 7.2 g/dL (ref 6.0–8.3)

## 2017-06-10 LAB — URIC ACID: Uric Acid, Serum: 5.5 mg/dL (ref 4.0–7.8)

## 2017-06-10 LAB — SEDIMENTATION RATE: Sed Rate: 14 mm/hr (ref 0–15)

## 2017-06-10 LAB — VITAMIN B12: Vitamin B-12: 388 pg/mL (ref 211–911)

## 2017-06-10 LAB — FOLATE: Folate: 16.6 ng/mL (ref >5.9)

## 2017-06-10 LAB — IBC PANEL
Iron: 99 ug/dL (ref 42–165)
Saturation Ratios: 21 % (ref 20.0–50.0)
Transferrin: 336 mg/dL (ref 212.0–360.0)

## 2017-06-10 LAB — C-REACTIVE PROTEIN: CRP: 0.2 mg/dL — ABNORMAL LOW (ref 0.5–20.0)

## 2017-06-10 LAB — TSH: TSH: 0.99 u[IU]/mL (ref 0.35–4.50)

## 2017-06-10 LAB — CK: Total CK: 117 U/L (ref 7–232)

## 2017-06-10 MED ORDER — DICLOFENAC SODIUM 2 % TD SOLN: 2.0000 g | Freq: Two times a day (BID) | TRANSDERMAL | 3 refills | Status: DC

## 2017-06-10 NOTE — Assessment & Plan Note (Signed)
Pes planus bilaterally.  Discussed icing regimen and home exercises, discussed over-the-counter orthotics, discussed which activities to do which wants to avoid.  Patient will avoid being barefoot.  Could be some numbness from patient's back.  Possible EMGs may be necessary.  Laboratory workup given today.  Follow-up again in 4 weeks

## 2017-06-10 NOTE — Patient Instructions (Signed)
Great to meet you  Luke Wong is your friend.  Lets get labs downstairs today and make  Sure we are not missing something.  pennsaid pinkie amount topically 2 times daily as needed.   Over the counter lets get  Vitamin D 2000 IU daily  Turmeric 5000mg  twice daily  Tart cherry extract any dose at night I am hoping labs show something and then we will have a little more of a direction and then get you off some of the pain meds.

## 2017-06-10 NOTE — Assessment & Plan Note (Signed)
Continues to have radicular symptoms.  Has been tried on different medications.  Is seeing somebody for chronic pain management.  We discussed over-the-counter medications, laboratory workup to rule out any other organic causes that could be contributing.  We discussed icing regimen.  Discussed home exercises.  Follow-up again in 2-3 weeks.

## 2017-06-11 DIAGNOSIS — M7741 Metatarsalgia, right foot: Secondary | ICD-10-CM | POA: Diagnosis not present

## 2017-06-11 DIAGNOSIS — M62830 Muscle spasm of back: Secondary | ICD-10-CM | POA: Diagnosis not present

## 2017-06-11 DIAGNOSIS — M25572 Pain in left ankle and joints of left foot: Secondary | ICD-10-CM | POA: Diagnosis not present

## 2017-06-11 DIAGNOSIS — M79661 Pain in right lower leg: Secondary | ICD-10-CM | POA: Diagnosis not present

## 2017-06-11 DIAGNOSIS — M79662 Pain in left lower leg: Secondary | ICD-10-CM | POA: Diagnosis not present

## 2017-06-11 DIAGNOSIS — M9902 Segmental and somatic dysfunction of thoracic region: Secondary | ICD-10-CM | POA: Diagnosis not present

## 2017-06-11 DIAGNOSIS — M7742 Metatarsalgia, left foot: Secondary | ICD-10-CM | POA: Diagnosis not present

## 2017-06-11 DIAGNOSIS — M25571 Pain in right ankle and joints of right foot: Secondary | ICD-10-CM | POA: Diagnosis not present

## 2017-06-11 DIAGNOSIS — M546 Pain in thoracic spine: Secondary | ICD-10-CM | POA: Diagnosis not present

## 2017-06-11 DIAGNOSIS — M9905 Segmental and somatic dysfunction of pelvic region: Secondary | ICD-10-CM | POA: Diagnosis not present

## 2017-06-11 DIAGNOSIS — M9901 Segmental and somatic dysfunction of cervical region: Secondary | ICD-10-CM | POA: Diagnosis not present

## 2017-06-13 DIAGNOSIS — M25572 Pain in left ankle and joints of left foot: Secondary | ICD-10-CM | POA: Diagnosis not present

## 2017-06-13 DIAGNOSIS — M25571 Pain in right ankle and joints of right foot: Secondary | ICD-10-CM | POA: Diagnosis not present

## 2017-06-13 DIAGNOSIS — M62830 Muscle spasm of back: Secondary | ICD-10-CM | POA: Diagnosis not present

## 2017-06-13 DIAGNOSIS — M546 Pain in thoracic spine: Secondary | ICD-10-CM | POA: Diagnosis not present

## 2017-06-13 DIAGNOSIS — M7742 Metatarsalgia, left foot: Secondary | ICD-10-CM | POA: Diagnosis not present

## 2017-06-13 DIAGNOSIS — M7741 Metatarsalgia, right foot: Secondary | ICD-10-CM | POA: Diagnosis not present

## 2017-06-13 DIAGNOSIS — M79661 Pain in right lower leg: Secondary | ICD-10-CM | POA: Diagnosis not present

## 2017-06-13 DIAGNOSIS — M79662 Pain in left lower leg: Secondary | ICD-10-CM | POA: Diagnosis not present

## 2017-06-13 DIAGNOSIS — M9902 Segmental and somatic dysfunction of thoracic region: Secondary | ICD-10-CM | POA: Diagnosis not present

## 2017-06-13 DIAGNOSIS — M9901 Segmental and somatic dysfunction of cervical region: Secondary | ICD-10-CM | POA: Diagnosis not present

## 2017-06-13 DIAGNOSIS — M9905 Segmental and somatic dysfunction of pelvic region: Secondary | ICD-10-CM | POA: Diagnosis not present

## 2017-06-13 LAB — VITAMIN D 1,25 DIHYDROXY
Vitamin D 1, 25 (OH)2 Total: 42 pg/mL (ref 18–72)
Vitamin D2 1, 25 (OH)2: <8 pg/mL
Vitamin D3 1, 25 (OH)2: 42 pg/mL

## 2017-06-13 LAB — ANGIOTENSIN CONVERTING ENZYME: Angiotensin-Converting Enzyme: 92 U/L — ABNORMAL HIGH (ref 9–67)

## 2017-06-13 LAB — PTH, INTACT AND CALCIUM
Calcium: 9.8 mg/dL (ref 8.6–10.3)
PTH: 16 pg/mL (ref 14–64)

## 2017-06-13 LAB — CYCLIC CITRUL PEPTIDE ANTIBODY, IGG: Cyclic Citrullin Peptide Ab: <16 UNITS

## 2017-06-13 LAB — ANA: Anti Nuclear Antibody(ANA): NEGATIVE

## 2017-06-13 LAB — RHEUMATOID FACTOR: Rhuematoid fact SerPl-aCnc: <14 IU/mL (ref <14)

## 2017-06-17 DIAGNOSIS — M5416 Radiculopathy, lumbar region: Secondary | ICD-10-CM | POA: Diagnosis not present

## 2017-06-17 DIAGNOSIS — R030 Elevated blood-pressure reading, without diagnosis of hypertension: Secondary | ICD-10-CM | POA: Diagnosis not present

## 2017-06-17 DIAGNOSIS — M545 Low back pain: Secondary | ICD-10-CM | POA: Diagnosis not present

## 2017-06-17 DIAGNOSIS — Z6832 Body mass index (BMI) 32.0-32.9, adult: Secondary | ICD-10-CM | POA: Diagnosis not present

## 2017-06-26 ENCOUNTER — Ambulatory Visit: Payer: BLUE CROSS/BLUE SHIELD | Admitting: Family Medicine

## 2017-07-07 NOTE — Progress Notes (Signed)
[facility] [place], [place] [address] Phone: [phone] Subjective:     CC: Back pain and polymyalgia.  [id number]  Luke Wong is a 47 y.o. male coming in with complaint of neck pain and polyarthralgia.  Found to have significant low B12.  Has seen many providers and has had difficulty with medications including gabapentin and narcotics which patient is on at this moment.  Patient given home exercises.  Patient states that he has not had any change since last visit. He has been doing his HEP and did some physical therapy which has not helped his left ankle or his lower back.      Past Medical History:  Diagnosis Date  . Asthma   . Depression   . GERD (gastroesophageal reflux disease)   . History of CT scan of chest    a. Notes indicate Cardiac CT with Ca score 0  . History of Doppler ultrasound    a. Carotid US 12/12 done in [place], [place] - Unremarkable; < 50% bilaterally  . History of stress test    a. ETT-Echo 12/12 done in [place], [place] - Normal, Ex 11'14"; non-specific ST changes  /  b. Myoview 8/17: EF 54%, anteroseptal defect-likely artifact, no ischemia, low risk  . Hyperlipidemia   . Pneumonia 2015   Past Surgical History:  Procedure Laterality Date  . ANKLE SURGERY Left 06/07/13, 3/16   Arthroscopic debridement  x2  . HERNIA REPAIR Bilateral 1994  . LUMBAR LAMINECTOMY/ DECOMPRESSION WITH MET-RX Right 11/30/2014   Procedure: Right L4-5 Far Lateral Microdiskectomy w/ Metrx;  Surgeon: Kevan Ny Ditty, MD;  Location: [facility];  Service: Neurosurgery;  Laterality: Right;  Right L4-5 Far Lateral Microdiskectomy   Social History   Socioeconomic History  . Marital status: Married    Spouse name: None  . Number of children: None  . Years of education: None  . Highest education level: None  Social Needs  . Financial resource strain: None  . Food insecurity - worry: None  . Food insecurity - inability: None  .  Transportation needs - medical: None  . Transportation needs - non-medical: None  Occupational History  . Occupation: Attorney  Tobacco Use  . Smoking status: Never Smoker  . Smokeless tobacco: Never Used  Substance and Sexual Activity  . Alcohol use: Yes    Comment: once per wk 0- 3 wine, beer, liquor  . Drug use: No  . Sexual activity: None  Other Topics Concern  . None  Social History Narrative  . None   Allergies  Allergen Reactions  . Demerol [Meperidine]     unknown   Family History  Problem Relation Age of Onset  . Thyroid cancer Mother   . Heart disease Father   . Heart attack Father   . Heart attack Paternal Grandfather      Past medical history, social, surgical and family history all reviewed in electronic medical record.  No pertanent information unless stated regarding to the chief complaint.   Review of Systems:Review of systems updated and as accurate as of 07/08/17  No headache, visual changes, nausea, vomiting, diarrhea, constipation, dizziness, abdominal pain, skin rash, fevers, chills, night sweats, weight loss, swollen lymph nodes, body aches, joint swelling, muscle aches, chest pain, shortness of breath, mood changes.   Objective  Blood pressure 110/82, pulse 84, height 6' (1.829 m), weight 243 lb (110.2 kg), SpO2 96 %. Systems examined below as of 07/08/17   General:  No apparent distress alert and oriented x3 mood and affect normal, dressed appropriately.  HEENT: Pupils equal, extraocular movements intact  Respiratory: Patient's speak in full sentences and does not appear short of breath  Cardiovascular: No lower extremity edema, non tender, no erythema  Skin: Warm dry intact with no signs of infection or rash on extremities or on axial skeleton.  Abdomen: Soft nontender  Neuro: Cranial nerves II through XII are intact, neurovascularly intact in all extremities with 2+ DTRs and 2+ pulses.  Lymph: No lymphadenopathy of posterior or anterior cervical  chain or axillae bilaterally.  Gait normal with good balance and coordination.  MSK:  Non tender with full range of motion and good stability and symmetric strength and tone of shoulders, elbows, wrist, hip, knee and ankles bilaterally.  Back Exam:  Inspection: Unremarkable poor core strength Motion: Flexion 35 deg, Extension 25 deg, Side Bending to 45 deg bilaterally,  Rotation to 45 deg bilaterally  SLR laying: Negative  XSLR laying: Negative  Palpable tenderness: Tender to palpation in the paraspinal musculature lumbar spine right greater than left.Marland Kitchen FABER: Tightness bilaterally. Sensory change: Gross sensation intact to all lumbar and sacral dermatomes.  Reflexes: 2+ at both patellar tendons, 2+ at achilles tendons, Babinski's downgoing.  Strength at foot  Plantar-flexion: 5/5 Dorsi-flexion: 5/5 Eversion: 5/5 Inversion: 5/5  Leg strength  4+ out of 5 but symmetric  Osteopathic findings C2 flexed rotated and side bent right T3 extended rotated and side bent left  T6 extended rotated and side bent left L2 flexed rotated and side bent right Sacrum right on right     Impression and Recommendations:     This case required medical decision making of moderate complexity.      Note: This dictation was prepared with Dragon dictation along with smaller phrase technology. Any transcriptional errors that result from this process are unintentional.

## 2017-07-08 ENCOUNTER — Ambulatory Visit: Payer: BLUE CROSS/BLUE SHIELD | Admitting: Family Medicine

## 2017-07-08 ENCOUNTER — Encounter: Payer: Self-pay | Admitting: Family Medicine

## 2017-07-08 DIAGNOSIS — M999 Biomechanical lesion, unspecified: Secondary | ICD-10-CM | POA: Insufficient documentation

## 2017-07-08 DIAGNOSIS — M5416 Radiculopathy, lumbar region: Secondary | ICD-10-CM

## 2017-07-08 NOTE — Assessment & Plan Note (Signed)
Discussed icing regimen, home exercise.  Patient has had significant difficulty with repeat imaging if necessary.  Patient is on chronic pain medications and encouraged him to try to decrease when possible.  Patient attempted osteopathic manipulation and hopefully will be beneficial.  Follow-up with me again in 4-6 weeks

## 2017-07-08 NOTE — Assessment & Plan Note (Signed)
Decision today to treat with OMT was based on Physical Exam  After verbal consent patient was treated with HVLA, ME, FPR techniques in cervical, thoracic, lumbar and sacral areas  Patient tolerated the procedure well with improvement in symptoms  Patient given exercises, stretches and lifestyle modifications  See medications in patient instructions if given  Patient will follow up in 4 weeks 

## 2017-07-08 NOTE — Patient Instructions (Signed)
Good to see you  Luke Wong is your friend.  I am optimisitc but will take some time B12 1000mcg daily with 200mg  of B6  See me again in 3-4 weeks

## 2017-07-25 DIAGNOSIS — L259 Unspecified contact dermatitis, unspecified cause: Secondary | ICD-10-CM | POA: Diagnosis not present

## 2017-07-25 DIAGNOSIS — Z6833 Body mass index (BMI) 33.0-33.9, adult: Secondary | ICD-10-CM | POA: Diagnosis not present

## 2017-07-25 DIAGNOSIS — B359 Dermatophytosis, unspecified: Secondary | ICD-10-CM | POA: Diagnosis not present

## 2017-08-02 ENCOUNTER — Encounter: Payer: Self-pay | Admitting: Family Medicine

## 2017-08-02 ENCOUNTER — Ambulatory Visit: Payer: BLUE CROSS/BLUE SHIELD | Admitting: Family Medicine

## 2017-08-02 VITALS — BP 102/78 | HR 64 | Ht 72.0 in | Wt 251.0 lb

## 2017-08-02 DIAGNOSIS — M5416 Radiculopathy, lumbar region: Secondary | ICD-10-CM

## 2017-08-02 DIAGNOSIS — M999 Biomechanical lesion, unspecified: Secondary | ICD-10-CM | POA: Diagnosis not present

## 2017-08-02 MED ORDER — LEVOTHYROXINE SODIUM 50 MCG PO TABS
50.0000 ug | ORAL_TABLET | Freq: Every day | ORAL | 3 refills | Status: DC
Start: 2017-08-02 — End: 2017-11-18

## 2017-08-02 MED ORDER — PREDNISONE 50 MG PO TABS: 50.0000 mg | ORAL_TABLET | Freq: Every day | ORAL | 0 refills | Status: DC

## 2017-08-02 NOTE — Assessment & Plan Note (Addendum)
Decision today to treat with OMT was based on Physical Exam  After verbal consent patient was treated with HVLA, ME, FPR techniques in  thoracic, lumbar and sacral areas  Patient tolerated the procedure well with improvement in symptoms  Patient given exercises, stretches and lifestyle modifications  See medications in patient instructions if given  Patient will follow up in 4-6 weeks 

## 2017-08-02 NOTE — Assessment & Plan Note (Signed)
Patient is having some mild increase in radicular symptoms.  Patient does have what appears to be an underlying chronic pain condition.  Has had significant difficulty with medications.  Encouraged him to continue the B12 supplementation.  Attempted osteopathic manipulation again today.  We discussed icing regimen and home exercises.  Prednisone given in case patient does not make improvement over the next 24 hours.  Follow-up again 2 weeks

## 2017-08-02 NOTE — Progress Notes (Signed)
[facility] [place], [place] [address] Phone: [phone] Subjective:    I'm seeing this patient by the request  of:    CC: Back pain follow-up  [id number]  Luke Wong is a 47 y.o. male coming in with complaint of back pain.  Seem to have more of a lumbar radiculopathy.  History of a microdiscectomy.  Most recent x-rays show no significant change or progression of degenerative disc disease.  Patient has been doing home exercises, B12 supplementation and gabapentin, and started manipulation.  Patient states that his back pain is worse. His pain on the lower left side has intensified since last weekend. No known mechanism. Pain is constant and does radiate into both legs.         Past Medical History:  Diagnosis Date  . Asthma   . Depression   . GERD (gastroesophageal reflux disease)   . History of CT scan of chest    a. Notes indicate Cardiac CT with Ca score 0  . History of Doppler ultrasound    a. Carotid US 12/12 done in [place], [place] - Unremarkable; < 50% bilaterally  . History of stress test    a. ETT-Echo 12/12 done in [place], [place] - Normal, Ex 11'14"; non-specific ST changes  /  b. Myoview 8/17: EF 54%, anteroseptal defect-likely artifact, no ischemia, low risk  . Hyperlipidemia   . Pneumonia 2015   Past Surgical History:  Procedure Laterality Date  . ANKLE SURGERY Left 06/07/13, 3/16   Arthroscopic debridement  x2  . HERNIA REPAIR Bilateral 1994  . LUMBAR LAMINECTOMY/ DECOMPRESSION WITH MET-RX Right 11/30/2014   Procedure: Right L4-5 Far Lateral Microdiskectomy w/ Metrx;  Surgeon: Kevan Ny Ditty, MD;  Location: [facility];  Service: Neurosurgery;  Laterality: Right;  Right L4-5 Far Lateral Microdiskectomy   Social History   Socioeconomic History  . Marital status: Married    Spouse name: Not on file  . Number of children: Not on file  . Years of education: Not on file  . Highest education level: Not on file    Occupational History  . Occupation: Occupational hygienist  . Financial resource strain: Not on file  . Food insecurity:    Worry: Not on file    Inability: Not on file  . Transportation needs:    Medical: Not on file    Non-medical: Not on file  Tobacco Use  . Smoking status: Never Smoker  . Smokeless tobacco: Never Used  Substance and Sexual Activity  . Alcohol use: Yes    Comment: once per wk 0- 3 wine, beer, liquor  . Drug use: No  . Sexual activity: Not on file  Lifestyle  . Physical activity:    Days per week: Not on file    Minutes per session: Not on file  . Stress: Not on file  Relationships  . Social connections:    Talks on phone: Not on file    Gets together: Not on file    Attends religious service: Not on file    Active member of club or organization: Not on file    Attends meetings of clubs or organizations: Not on file    Relationship status: Not on file  Other Topics Concern  . Not on file  Social History Narrative  . Not on file   Allergies  Allergen Reactions  . Demerol [Meperidine]     unknown   Family History  Problem Relation Age  of Onset  . Thyroid cancer Mother   . Heart disease Father   . Heart attack Father   . Heart attack Paternal Grandfather      Past medical history, social, surgical and family history all reviewed in electronic medical record.  No pertanent information unless stated regarding to the chief complaint.   Review of Systems:Review of systems updated and as accurate as of 08/02/17  No headache, visual changes, nausea, vomiting, diarrhea, constipation, dizziness, abdominal pain, skin rash, fevers, chills, night sweats, weight loss, swollen lymph nodes,joint swelling, chest pain, shortness of breath, mood changes.  Positive body aches, muscle aches  Objective  Blood pressure 102/78, pulse 64, height 6' (1.829 m), weight 251 lb (113.9 kg), SpO2 97 %. Systems examined below as of 08/02/17   General: No apparent  distress alert and oriented x3 mood and affect normal, dressed appropriately.  HEENT: Pupils equal, extraocular movements intact  Respiratory: Patient's speak in full sentences and does not appear short of breath  Cardiovascular: No lower extremity edema, non tender, no erythema  Skin: Warm dry intact with no signs of infection or rash on extremities or on axial skeleton.  Abdomen: Soft nontender  Neuro: Cranial nerves II through XII are intact, neurovascularly intact in all extremities with 2+ DTRs and 2+ pulses.  Lymph: No lymphadenopathy of posterior or anterior cervical chain or axillae bilaterally.  Gait normal with good balance and coordination.  MSK:  Non tender with full range of motion and good stability and symmetric strength and tone of shoulders, elbows, wrist, hip, knee and ankles bilaterally.  Back exam shows loss of lordosis and poor core strength.  Patient has significant tightness of the hamstrings that is more than usual.  Patient does have a positive Corky Sox on the right side.  Neurovascularly intact distally.   Significant increase in tenderness to palpation in the paraspinal musculature lumbar spine  Osteopathic findings T3 extended rotated and side bent right inhaled third rib T9 extended rotated and side bent left L3 flexed rotated and side bent right Sacrum right on right     Impression and Recommendations:     This case required medical decision making of moderate complexity.      Note: This dictation was prepared with Dragon dictation along with smaller phrase technology. Any transcriptional errors that result from this process are unintentional.

## 2017-08-02 NOTE — Patient Instructions (Signed)
I am sorry no significant difference yet  If not better by tomorrow start the prednisone daily for 5 days Try synthroid daily as well  After the 5 days if not better lets consider another injection in your back  Otherwise see me again in 2 weeks

## 2017-08-17 NOTE — Progress Notes (Signed)
[facility] [place], [place] [address] Phone: [phone] Subjective:     CC: Neck pain follow-up  [id number]  Luke Wong is a 47 y.o. male coming in with complaint of neck pain follow-up.  Patient had some lumbar radiculopathy.  History of microdiscectomy.  Patient over-the-counter medications, gabapentin started manipulation.  At last exam the patient was having worsening pain.  Patient states he has made no significant improvement.  If anything the pain is worsening.  More radiation down the leg.  Patient states that it is severe overall.  Continues to be the pain medication on a regular basis.  Still having pain with the Cymbalta.  Patient feels like no other medications will be beneficial at this time.  Does not feel that the manipulation has been helpful.     Past Medical History:  Diagnosis Date  . Asthma   . Depression   . GERD (gastroesophageal reflux disease)   . History of CT scan of chest    a. Notes indicate Cardiac CT with Ca score 0  . History of Doppler ultrasound    a. Carotid US 12/12 done in [place], [place] - Unremarkable; < 50% bilaterally  . History of stress test    a. ETT-Echo 12/12 done in [place], [place] - Normal, Ex 11'14"; non-specific ST changes  /  b. Myoview 8/17: EF 54%, anteroseptal defect-likely artifact, no ischemia, low risk  . Hyperlipidemia   . Pneumonia 2015   Past Surgical History:  Procedure Laterality Date  . ANKLE SURGERY Left 06/07/13, 3/16   Arthroscopic debridement  x2  . HERNIA REPAIR Bilateral 1994  . LUMBAR LAMINECTOMY/ DECOMPRESSION WITH MET-RX Right 11/30/2014   Procedure: Right L4-5 Far Lateral Microdiskectomy w/ Metrx;  Surgeon: Kevan Ny Ditty, MD;  Location: [facility];  Service: Neurosurgery;  Laterality: Right;  Right L4-5 Far Lateral Microdiskectomy   Social History   Socioeconomic History  . Marital status: Married    Spouse name: Not on file  . Number of children: Not on file    . Years of education: Not on file  . Highest education level: Not on file  Occupational History  . Occupation: Occupational hygienist  . Financial resource strain: Not on file  . Food insecurity:    Worry: Not on file    Inability: Not on file  . Transportation needs:    Medical: Not on file    Non-medical: Not on file  Tobacco Use  . Smoking status: Never Smoker  . Smokeless tobacco: Never Used  Substance and Sexual Activity  . Alcohol use: Yes    Comment: once per wk 0- 3 wine, beer, liquor  . Drug use: No  . Sexual activity: Not on file  Lifestyle  . Physical activity:    Days per week: Not on file    Minutes per session: Not on file  . Stress: Not on file  Relationships  . Social connections:    Talks on phone: Not on file    Gets together: Not on file    Attends religious service: Not on file    Active member of club or organization: Not on file    Attends meetings of clubs or organizations: Not on file    Relationship status: Not on file  Other Topics Concern  . Not on file  Social History Narrative  . Not on file   Allergies  Allergen Reactions  . Demerol [Meperidine]  unknown   Family History  Problem Relation Age of Onset  . Thyroid cancer Mother   . Heart disease Father   . Heart attack Father   . Heart attack Paternal Grandfather      Past medical history, social, surgical and family history all reviewed in electronic medical record.  No pertanent information unless stated regarding to the chief complaint.   Review of Systems:Review of systems updated and as accurate as of 08/19/17  No headache, visual changes, nausea, vomiting, diarrhea, constipation, dizziness, abdominal pain, skin rash, fevers, chills, night sweats, weight loss, swollen lymph nodes, body aches, joint swelling, chest pain, shortness of breath, mood changes.  Positive muscle aches  Objective  Blood pressure 138/90, pulse 87, height 6' (1.829 m), weight 247 lb (112 kg), SpO2  97 %. Systems examined below as of 08/19/17   General: No apparent distress alert and oriented x3 mood and affect normal, dressed appropriately.  HEENT: Pupils equal, extraocular movements intact  Respiratory: Patient's speak in full sentences and does not appear short of breath  Cardiovascular: No lower extremity edema, non tender, no erythema  Skin: Warm dry intact with no signs of infection or rash on extremities or on axial skeleton.  Abdomen: Soft nontender  Neuro: Cranial nerves II through XII are intact, neurovascularly intact in all extremities with  2+ pulses.  Lymph: No lymphadenopathy of posterior or anterior cervical chain or axillae bilaterally.  Gait antalgic gait MSK:  Non tender with full range of motion and good stability and symmetric strength and tone of shoulders, elbows, wrist, hip, knee and ankles bilaterally.  Patient's back exam shows loss of lordosis.  Positive straight leg test on the left side.  Positive Faber on the left side.  Decreased range of motion in all planes.  Increasing tenderness to palpation in the paraspinal musculature lumbar spine.  4 out of 5 strength compared to the contralateral side.  Deep tendon reflexes 1+ on the left compared to the contralateral side.    Impression and Recommendations:     This case required medical decision making of moderate complexity.      Note: This dictation was prepared with Dragon dictation along with smaller phrase technology. Any transcriptional errors that result from this process are unintentional.

## 2017-08-19 ENCOUNTER — Ambulatory Visit: Payer: BLUE CROSS/BLUE SHIELD | Admitting: Family Medicine

## 2017-08-19 ENCOUNTER — Encounter: Payer: Self-pay | Admitting: Family Medicine

## 2017-08-19 VITALS — BP 138/90 | HR 87 | Ht 72.0 in | Wt 247.0 lb

## 2017-08-19 DIAGNOSIS — M5416 Radiculopathy, lumbar region: Secondary | ICD-10-CM

## 2017-08-19 DIAGNOSIS — M549 Dorsalgia, unspecified: Secondary | ICD-10-CM

## 2017-08-19 NOTE — Assessment & Plan Note (Signed)
Spent  25 minutes with patient face-to-face and had greater than 50% of counseling including as described in assessment and plan.  Worsening signs and symptoms now with weakness on the left leg as well as weakness with decreasing deep tendon reflexes.  Patient has failed all conservative therapy.  Discussed the possibility of an MRI.  Patient will be in agreement with the plan and could be a candidate for possible epidurals or facet injections. Patient will follow-up after the advanced imaging will discuss further treatment options.  Patient knows if worsening symptoms to go to the emergency room immediately.

## 2017-08-19 NOTE — Patient Instructions (Signed)
Good to see you  Ice is your friend I am sorry we have not made a large change  We will get MRI.  Call [phone] if have not heard anything in the next 2 days I will write you after and discuss next steps Continue everything else for now.

## 2017-08-26 ENCOUNTER — Encounter: Payer: Self-pay | Admitting: Family Medicine

## 2017-08-30 ENCOUNTER — Other Ambulatory Visit: Payer: BLUE CROSS/BLUE SHIELD

## 2017-09-04 DIAGNOSIS — R21 Rash and other nonspecific skin eruption: Secondary | ICD-10-CM | POA: Diagnosis not present

## 2017-09-04 DIAGNOSIS — Z6833 Body mass index (BMI) 33.0-33.9, adult: Secondary | ICD-10-CM | POA: Diagnosis not present

## 2017-09-05 DIAGNOSIS — M5416 Radiculopathy, lumbar region: Secondary | ICD-10-CM | POA: Diagnosis not present

## 2017-09-05 DIAGNOSIS — M545 Low back pain: Secondary | ICD-10-CM | POA: Diagnosis not present

## 2017-09-09 DIAGNOSIS — L300 Nummular dermatitis: Secondary | ICD-10-CM | POA: Diagnosis not present

## 2017-09-10 ENCOUNTER — Ambulatory Visit
Admission: RE | Admit: 2017-09-10 | Discharge: 2017-09-10 | Disposition: A | Discharge status: Home / Self Care | Payer: BLUE CROSS/BLUE SHIELD | Source: Ambulatory Visit | Attending: Family Medicine | Admitting: Family Medicine

## 2017-09-10 DIAGNOSIS — M48061 Spinal stenosis, lumbar region without neurogenic claudication: Secondary | ICD-10-CM | POA: Diagnosis not present

## 2017-09-10 DIAGNOSIS — M549 Dorsalgia, unspecified: Secondary | ICD-10-CM

## 2017-09-12 ENCOUNTER — Encounter: Payer: Self-pay | Admitting: Family Medicine

## 2017-09-18 ENCOUNTER — Encounter: Payer: Self-pay | Admitting: Family Medicine

## 2017-09-18 DIAGNOSIS — M5416 Radiculopathy, lumbar region: Secondary | ICD-10-CM

## 2017-09-18 NOTE — Telephone Encounter (Signed)
Please advise 

## 2017-09-26 ENCOUNTER — Ambulatory Visit
Admission: RE | Admit: 2017-09-26 | Discharge: 2017-09-26 | Disposition: A | Discharge status: Home / Self Care | Payer: BLUE CROSS/BLUE SHIELD | Source: Ambulatory Visit | Attending: Family Medicine | Admitting: Family Medicine

## 2017-09-26 DIAGNOSIS — M5126 Other intervertebral disc displacement, lumbar region: Secondary | ICD-10-CM | POA: Diagnosis not present

## 2017-09-26 DIAGNOSIS — M5416 Radiculopathy, lumbar region: Secondary | ICD-10-CM

## 2017-09-26 MED ORDER — METHYLPREDNISOLONE ACETATE 40 MG/ML INJ SUSP (RADIOLOG
120.0000 mg | Freq: Once | INTRAMUSCULAR | Status: AC
  Administered 2017-09-26: 120 mg via EPIDURAL

## 2017-09-26 MED ORDER — IOPAMIDOL (ISOVUE-M 200) INJECTION 41%
1.0000 mL | Freq: Once | INTRAMUSCULAR | Status: AC
  Administered 2017-09-26: 1 mL via EPIDURAL

## 2017-09-26 NOTE — Discharge Instructions (Signed)

## 2017-10-03 ENCOUNTER — Encounter: Payer: Self-pay | Admitting: Family Medicine

## 2017-10-25 DIAGNOSIS — Z0000 Encounter for general adult medical examination without abnormal findings: Secondary | ICD-10-CM | POA: Diagnosis not present

## 2017-10-25 DIAGNOSIS — R82998 Other abnormal findings in urine: Secondary | ICD-10-CM | POA: Diagnosis not present

## 2017-10-25 DIAGNOSIS — E291 Testicular hypofunction: Secondary | ICD-10-CM | POA: Diagnosis not present

## 2017-10-25 DIAGNOSIS — Z125 Encounter for screening for malignant neoplasm of prostate: Secondary | ICD-10-CM | POA: Diagnosis not present

## 2017-11-01 DIAGNOSIS — Z0000 Encounter for general adult medical examination without abnormal findings: Secondary | ICD-10-CM | POA: Diagnosis not present

## 2017-11-01 DIAGNOSIS — M545 Low back pain: Secondary | ICD-10-CM | POA: Diagnosis not present

## 2017-11-01 DIAGNOSIS — E668 Other obesity: Secondary | ICD-10-CM | POA: Diagnosis not present

## 2017-11-01 DIAGNOSIS — E291 Testicular hypofunction: Secondary | ICD-10-CM | POA: Diagnosis not present

## 2017-11-01 DIAGNOSIS — Z1389 Encounter for screening for other disorder: Secondary | ICD-10-CM | POA: Diagnosis not present

## 2017-11-01 DIAGNOSIS — F1120 Opioid dependence, uncomplicated: Secondary | ICD-10-CM | POA: Diagnosis not present

## 2017-11-03 ENCOUNTER — Emergency Department (HOSPITAL_COMMUNITY)
Admission: EM | Admit: 2017-11-03 | Discharge: 2017-11-03 | Disposition: A | Discharge status: Home / Self Care | Payer: BLUE CROSS/BLUE SHIELD | Attending: Emergency Medicine | Admitting: Emergency Medicine

## 2017-11-03 ENCOUNTER — Emergency Department (HOSPITAL_COMMUNITY): Payer: BLUE CROSS/BLUE SHIELD

## 2017-11-03 DIAGNOSIS — R410 Disorientation, unspecified: Secondary | ICD-10-CM | POA: Diagnosis not present

## 2017-11-03 DIAGNOSIS — F419 Anxiety disorder, unspecified: Secondary | ICD-10-CM | POA: Diagnosis not present

## 2017-11-03 DIAGNOSIS — R531 Weakness: Secondary | ICD-10-CM | POA: Diagnosis not present

## 2017-11-03 DIAGNOSIS — Z79899 Other long term (current) drug therapy: Secondary | ICD-10-CM | POA: Insufficient documentation

## 2017-11-03 DIAGNOSIS — Z7982 Long term (current) use of aspirin: Secondary | ICD-10-CM | POA: Insufficient documentation

## 2017-11-03 DIAGNOSIS — J45909 Unspecified asthma, uncomplicated: Secondary | ICD-10-CM | POA: Insufficient documentation

## 2017-11-03 DIAGNOSIS — R42 Dizziness and giddiness: Secondary | ICD-10-CM | POA: Insufficient documentation

## 2017-11-03 DIAGNOSIS — I10 Essential (primary) hypertension: Secondary | ICD-10-CM | POA: Insufficient documentation

## 2017-11-03 LAB — COMPREHENSIVE METABOLIC PANEL
ALT: 67 U/L — ABNORMAL HIGH (ref 0–44)
AST: 36 U/L (ref 15–41)
Albumin: 4.3 g/dL (ref 3.5–5.0)
Alkaline Phosphatase: 54 U/L (ref 38–126)
Anion gap: 10 (ref 5–15)
BUN: 26 mg/dL — ABNORMAL HIGH (ref 6–20)
CO2: 28 mmol/L (ref 22–32)
Calcium: 10.1 mg/dL (ref 8.9–10.3)
Chloride: 104 mmol/L (ref 98–111)
Creatinine, Ser: 1.38 mg/dL — ABNORMAL HIGH (ref 0.61–1.24)
GFR calc Af Amer: >60 mL/min (ref >60)
GFR calc non Af Amer: >60 mL/min (ref >60)
Glucose, Bld: 125 mg/dL — ABNORMAL HIGH (ref 70–99)
Potassium: 3.5 mmol/L (ref 3.5–5.1)
Sodium: 142 mmol/L (ref 135–145)
Total Bilirubin: 0.7 mg/dL (ref 0.3–1.2)
Total Protein: 7.9 g/dL (ref 6.5–8.1)

## 2017-11-03 LAB — CBC WITH DIFFERENTIAL/PLATELET
Basophils Absolute: 0 10*3/uL (ref 0.0–0.1)
Basophils Relative: 0 %
Eosinophils Absolute: 0.2 10*3/uL (ref 0.0–0.7)
Eosinophils Relative: 1 %
HCT: 46 % (ref 39.0–52.0)
Hemoglobin: 15.3 g/dL (ref 13.0–17.0)
Lymphocytes Relative: 16 %
Lymphs Abs: 2 10*3/uL (ref 0.7–4.0)
MCH: 28.7 pg (ref 26.0–34.0)
MCHC: 33.3 g/dL (ref 30.0–36.0)
MCV: 86.1 fL (ref 78.0–100.0)
Monocytes Absolute: 1.3 10*3/uL — ABNORMAL HIGH (ref 0.1–1.0)
Monocytes Relative: 11 %
Neutro Abs: 9 10*3/uL — ABNORMAL HIGH (ref 1.7–7.7)
Neutrophils Relative %: 72 %
Platelets: 385 10*3/uL (ref 150–400)
RBC: 5.34 MIL/uL (ref 4.22–5.81)
RDW: 13.6 % (ref 11.5–15.5)
WBC: 12.5 10*3/uL — ABNORMAL HIGH (ref 4.0–10.5)

## 2017-11-03 LAB — URINALYSIS, ROUTINE W REFLEX MICROSCOPIC
Bacteria, UA: NONE SEEN
Bilirubin Urine: NEGATIVE
Glucose, UA: NEGATIVE mg/dL
Hgb urine dipstick: NEGATIVE
Ketones, ur: NEGATIVE mg/dL
Leukocytes, UA: NEGATIVE
Nitrite: NEGATIVE
Protein, ur: 100 mg/dL — AB
Specific Gravity, Urine: 1.025 (ref 1.005–1.030)
pH: 6 (ref 5.0–8.0)

## 2017-11-03 LAB — LIPASE, BLOOD: Lipase: 29 U/L (ref 11–51)

## 2017-11-03 LAB — ETHANOL: Alcohol, Ethyl (B): <10 mg/dL (ref <10)

## 2017-11-03 LAB — CBG MONITORING, ED: Glucose-Capillary: 144 mg/dL — ABNORMAL HIGH (ref 70–99)

## 2017-11-03 MED ORDER — SODIUM CHLORIDE 0.9 % IV BOLUS
1000.0000 mL | Freq: Once | INTRAVENOUS | Status: AC
  Administered 2017-11-03: 1000 mL via INTRAVENOUS

## 2017-11-03 NOTE — ED Notes (Signed)
SBAR Report received from previous nurse. Pt received calm and visible on unit. Pt denies current SI/ HI, A/V H, depression, anxiety, or pain at this time, and appears otherwise stable and free of distress. Pt reminded of camera surveillance, q 15 min rounds, and rules of the milieu. Will continue to assess. 

## 2017-11-03 NOTE — ED Provider Notes (Signed)
[facility] DEPT Provider Note   CSN: [id number] Arrival date & time: 11/03/17  1559     History   Chief Complaint Chief Complaint  Patient presents with  . Weakness    HPI Luke Wong is a 47 y.o. male.  HPI Presents with multiple complaints. Is a history of hypertension, family history of cardiac disease, but is generally functional active male.  When he does have a history of chronic back pain as well, for which she takes MS Contin. Today the patient smoked marijuana, which he does not typically do, but has done several times in the past week after he recently traveled to a state in which was legal. He notes that soon after taking the marijuana he felt lightheaded, was particularly weak, had difficulty ambulating, felt amnestic to events earlier in the day.  When he was brought here by his wife for evaluation. They note his history of hypertension, family history of cardiac disease, concern for ongoing episodes of chest pain, though with reassuring recent radiology evaluation.  Past Medical History:  Diagnosis Date  . Asthma   . Depression   . GERD (gastroesophageal reflux disease)   . History of CT scan of chest    a. Notes indicate Cardiac CT with Ca score 0  . History of Doppler ultrasound    a. Carotid US 12/12 done in [place], [place] - Unremarkable; < 50% bilaterally  . History of stress test    a. ETT-Echo 12/12 done in [place], [place] - Normal, Ex 11'14"; non-specific ST changes  /  b. Myoview 8/17: EF 54%, anteroseptal defect-likely artifact, no ischemia, low risk  . Hyperlipidemia   . Pneumonia 2015    Patient Active Problem List   Diagnosis Date Noted  . Nonallopathic lesion of lumbosacral region 07/08/2017  . Nonallopathic lesion of sacral region 07/08/2017  . Nonallopathic lesion of thoracic region 07/08/2017  . Pes planus 06/10/2017  . Obesity 03/02/2015  . Lumbar radiculopathy 11/30/2014  . Hyperlipidemia 09/15/2014  . Dysfunction  of both eustachian tubes 03/13/2014  . Upper airway cough syndrome 07/18/2013    Past Surgical History:  Procedure Laterality Date  . ANKLE SURGERY Left 06/07/13, 3/16   Arthroscopic debridement  x2  . HERNIA REPAIR Bilateral 1994  . LUMBAR LAMINECTOMY/ DECOMPRESSION WITH MET-RX Right 11/30/2014   Procedure: Right L4-5 Far Lateral Microdiskectomy w/ Metrx;  Surgeon: Kevan Ny Ditty, MD;  Location: [facility];  Service: Neurosurgery;  Laterality: Right;  Right L4-5 Far Lateral Microdiskectomy        Home Medications    Prior to Admission medications   Medication Sig Start Date End Date Taking? Authorizing Provider  ANDROGEL PUMP 20.25 MG/ACT (1.62%) GEL Inject 1 application into the skin daily.  11/24/15   [provider]  aspirin 81 MG tablet Take 81 mg by mouth daily.    [provider]  atorvastatin (LIPITOR) 40 MG tablet Take 1 tablet (40 mg total) by mouth daily. 01/18/17   Fay Records, MD  benzonatate (TESSALON) 200 MG capsule Take 1 capsule (200 mg total) by mouth 3 (three) times daily as needed for cough. 03/21/17   Parrett, Fonnie Mu, NP  cetirizine (ZYRTEC) 10 MG tablet Take 10 mg by mouth daily.    [provider]  Diclofenac Sodium (PENNSAID) 2 % SOLN Place 2 g onto the skin 2 (two) times daily. 06/10/17   Lyndal Pulley, DO  DULoxetine (CYMBALTA) 60 MG capsule Take 60 mg by mouth daily.  06/26/13   [provider]  fenofibrate (TRICOR) 145 MG tablet Take 1 tablet (145 mg total) by mouth daily. 01/18/17   Fay Records, MD  finasteride (PROPECIA) 1 MG tablet Take 1 mg by mouth daily.    [provider]  HYDROcodone-homatropine (HYCODAN) 5-1.5 MG/5ML syrup Take 5 mLs by mouth every 6 (six) hours as needed for cough. 03/21/17   Parrett, Fonnie Mu, NP  levothyroxine (SYNTHROID, LEVOTHROID) 50 MCG tablet Take 1 tablet (50 mcg total) by mouth daily. 08/02/17   Lyndal Pulley, DO  meloxicam (MOBIC) 7.5 MG tablet Take 7.5 mg by mouth 2  (two) times daily.    [provider]  morphine (MS CONTIN) 15 MG 12 hr tablet Take 15 mg by mouth 2 (two) times daily after a meal. 12/29/16   [provider]  nitroGLYCERIN (NITROSTAT) 0.4 MG SL tablet Place 1 tablet (0.4 mg total) under the tongue every 5 (five) minutes as needed. 01/18/17   Fay Records, MD  Oxycodone HCl 10 MG TABS Take 10 mg by mouth 3 (three) times daily. Take 2 tab po every eight hours, nte three tabs per day. 01/14/17   [provider]  predniSONE (DELTASONE) 50 MG tablet Take 1 tablet (50 mg total) by mouth daily. 08/02/17   Lyndal Pulley, DO  traZODone (DESYREL) 100 MG tablet Take 100 mg by mouth daily. Take 100 mg by mouth daily. 01/12/17   [provider]  triamterene-hydrochlorothiazide (MAXZIDE-25) 37.5-25 MG tablet Take 0.5 tablets by mouth daily. 01/18/17   Fay Records, MD    Family History Family History  Problem Relation Age of Onset  . Thyroid cancer Mother   . Heart disease Father   . Heart attack Father   . Heart attack Paternal Grandfather     Social History Social History   Tobacco Use  . Smoking status: Never Smoker  . Smokeless tobacco: Never Used  Substance Use Topics  . Alcohol use: Yes    Comment: once per wk 0- 3 wine, beer, liquor  . Drug use: No     Allergies   Demerol [meperidine]   Review of Systems Review of Systems  Constitutional:       Per HPI, otherwise negative  HENT:       Per HPI, otherwise negative  Respiratory:       Per HPI, otherwise negative  Cardiovascular:       Per HPI, otherwise negative  Gastrointestinal: Negative for vomiting.  Endocrine:       Negative aside from HPI  Genitourinary:       Neg aside from HPI   Musculoskeletal:       Per HPI, otherwise negative  Skin: Negative.   Neurological: Positive for weakness. Negative for syncope.  Psychiatric/Behavioral: Positive for confusion. The patient is nervous/anxious.      Physical Exam Updated Vital  Signs BP 134/83 (BP Location: Right Arm)   Pulse (!) 105   Resp 18   SpO2 98%   Physical Exam  Constitutional: He is oriented to person, place, and time. He appears well-developed. No distress.  HENT:  Head: Normocephalic and atraumatic.  Eyes: Conjunctivae and EOM are normal.  Cardiovascular: Normal rate and regular rhythm.  Pulmonary/Chest: Effort normal. No stridor. No respiratory distress.  Abdominal: He exhibits no distension.  Musculoskeletal: He exhibits no edema.  Neurological: He is alert and oriented to person, place, and time. He displays no atrophy and no tremor. He exhibits normal muscle  tone. He displays no seizure activity. Coordination normal.  Skin: Skin is warm and dry.  Psychiatric: He has a normal mood and affect.  Nursing note and vitals reviewed.    ED Treatments / Results  Labs (all labs ordered are listed, but only abnormal results are displayed) Labs Reviewed  COMPREHENSIVE METABOLIC PANEL - Abnormal; Notable for the following components:      Result Value   Glucose, Bld 125 (*)    BUN 26 (*)    Creatinine, Ser 1.38 (*)    ALT 67 (*)    All other components within normal limits  CBC WITH DIFFERENTIAL/PLATELET - Abnormal; Notable for the following components:   WBC 12.5 (*)    Neutro Abs 9.0 (*)    Monocytes Absolute 1.3 (*)    All other components within normal limits  URINALYSIS, ROUTINE W REFLEX MICROSCOPIC - Abnormal; Notable for the following components:   Color, Urine AMBER (*)    APPearance CLOUDY (*)    Protein, ur 100 (*)    All other components within normal limits  CBG MONITORING, ED - Abnormal; Notable for the following components:   Glucose-Capillary 144 (*)    All other components within normal limits  LIPASE, BLOOD  ETHANOL    EKG Sinus tachycardia, rate 101, nonspecific T wave changes, abnormal  Radiology Dg Chest 2 View  Result Date: 11/03/2017 CLINICAL DATA:  Sudden onset weakness after smoking marijuana today. EXAM:  CHEST - 2 VIEW COMPARISON:  Chest x-ray dated May 17, 2016. FINDINGS: The heart size and mediastinal contours are within normal limits. Normal pulmonary vascularity. Low lung volumes. Minimal atelectasis at the left lung base. No focal consolidation, pleural effusion, or pneumothorax. No acute osseous abnormality. IMPRESSION: No active cardiopulmonary disease. Electronically Signed   By: Titus Dubin M.D.   On: 11/03/2017 17:41    Procedures Procedures (including critical care time)  Medications Ordered in ED Medications  sodium chloride 0.9 % bolus 1,000 mL (0 mLs Intravenous Stopped 11/03/17 2023)     Initial Impression / Assessment and Plan / ED Course  I have reviewed the triage vital signs and the nursing notes.  Pertinent labs & imaging results that were available during my care of the patient were reviewed by me and considered in my medical decision making (see chart for details).   Repeat exam patient is awake alert, in no distress, no new complaints. I reviewed initial findings with him, including elevated creatinine beyond baseline, some suspicion for dehydration contributing to his illness.  8:57 PM Patient states that he feels substantially better, having received fluid resuscitation, eating dinner. Wife now present again, and we discussed all findings.  With his improvement and no new complaints.  Some suspicion for dehydration, multifactorial etiology for his episode of weakness.  Final Clinical Impressions(s) / ED Diagnoses  Weakness   Carmin Muskrat, MD 11/03/17 2105

## 2017-11-03 NOTE — ED Triage Notes (Signed)
Patient taking MS Contin for chronic pain for herniated disc in the lumbar area.  Patient smoked some weed and became weak.  Now he has returned to normal.

## 2017-11-03 NOTE — ED Notes (Signed)
Orders received to discharge patient. Pt aware and discharge summery reviewed with patient as well as review of medications. All personal items return and patient signed all discharge paperwork. Pt escorted off unit.  

## 2017-11-03 NOTE — Discharge Instructions (Addendum)
As discussed, your evaluation today has been largely reassuring.  But, it is important that you monitor your condition carefully, and do not hesitate to return to the ED if you develop new, or concerning changes in your condition. ? ?Otherwise, please follow-up with your physician for appropriate ongoing care. ? ?

## 2017-11-03 NOTE — ED Notes (Signed)
IV complete.

## 2017-11-03 NOTE — ED Notes (Signed)
Labs collected. EKG completed.

## 2017-11-15 DIAGNOSIS — M545 Low back pain: Secondary | ICD-10-CM | POA: Diagnosis not present

## 2017-11-15 DIAGNOSIS — M5416 Radiculopathy, lumbar region: Secondary | ICD-10-CM | POA: Diagnosis not present

## 2017-11-15 DIAGNOSIS — Z6831 Body mass index (BMI) 31.0-31.9, adult: Secondary | ICD-10-CM | POA: Diagnosis not present

## 2017-11-15 DIAGNOSIS — I10 Essential (primary) hypertension: Secondary | ICD-10-CM | POA: Diagnosis not present

## 2017-11-18 ENCOUNTER — Ambulatory Visit: Payer: BLUE CROSS/BLUE SHIELD | Admitting: Primary Care

## 2017-11-18 ENCOUNTER — Encounter: Payer: Self-pay | Admitting: Primary Care

## 2017-11-18 DIAGNOSIS — R058 Other specified cough: Secondary | ICD-10-CM

## 2017-11-18 DIAGNOSIS — R05 Cough: Secondary | ICD-10-CM

## 2017-11-18 MED ORDER — PREDNISONE 10 MG PO TABS: ORAL_TABLET | ORAL | 0 refills | Status: DC

## 2017-11-18 MED ORDER — AZITHROMYCIN 250 MG PO TABS: ORAL_TABLET | ORAL | 0 refills | Status: AC

## 2017-11-18 MED ORDER — HYDROCODONE-HOMATROPINE 5-1.5 MG/5ML PO SYRP: 5.0000 mL | ORAL_SOLUTION | Freq: Every evening | ORAL | 0 refills | Status: DC | PRN

## 2017-11-18 NOTE — Progress Notes (Signed)
@Patient  ID: Luke Wong, male    DOB: [birth year], 47 y.o.   MRN: [id number]  Chief Complaint  Patient presents with  . Acute Visit    Reports chest congestion, coughing, low grade. Symptoms started 4 days ago. Has been taking Robitussin OTC with no relief.    Referring provider: Tisovec, Adelfa Koh, MD  HPI: 47 year old male, never smoked. Hx obesity, hyperlipidemia, UACS, dysfunction both eustachian tubes. Follows with Dr. Sherene Sires for chronic rhinitis and cough.   TEST  - RX singulair 07/18/2013 > improved 08/21/13 > try off singulair 03/02/14 as pt not convinced helped - Sinus CT 08/28/13 > Sinuses are essentially clear with only minor dependent secretions noted in the inferior right frontal and left maxillary sinuses.There is anatomic narrowing of the ostiomeatal complexes, incompletely evaluated on this limited sinus study. - 02/26/2014 refer to ENT ([facility])> neg sinus dz/ pos ET dysfunction rec flonase  - 03/26/14  Allergy profile:  eos 0,  IGgE 87 Dog=cat, dust, ragweed  - 04/19/14  PFTs wnl including fef 25-75 off singulair and all bronchodilators  - NO 04/14/2015 during flare = 26  - Methacholine challenge 05/05/14 > neg for asthma  11/18/2017  Patient presents today for an acute visit complaining of chest congestion and tightness, bad cough and headache for 3 days. Associated sore throat, feels its r/t to his coughing. He has been taking over-the-counter Robitussin-DM and Tylenol as needed.  He is afebrile today.  Denies shortness of breath. No recent abx.    Allergies  Allergen Reactions  . Demerol [Meperidine]     unknown    Immunization History  Administered Date(s) Administered  . Influenza Split 01/24/2014, 01/29/2015, 01/22/2016, 02/18/2017  . Td 04/01/2011    Past Medical History:  Diagnosis Date  . Asthma   . Depression   . GERD (gastroesophageal reflux disease)   . History of CT scan of chest    a. Notes indicate Cardiac CT with Ca score 0  . History of Doppler  ultrasound    a. Carotid US 12/12 done in [place], [place] - Unremarkable; < 50% bilaterally  . History of stress test    a. ETT-Echo 12/12 done in [place], [place] - Normal, Ex 11'14"; non-specific ST changes  /  b. Myoview 8/17: EF 54%, anteroseptal defect-likely artifact, no ischemia, low risk  . Hyperlipidemia   . Pneumonia 2015    Tobacco History: Social History   Tobacco Use  Smoking Status Never Smoker  Smokeless Tobacco Never Used   Counseling given: Not Answered   Outpatient Medications Prior to Visit  Medication Sig Dispense Refill  . aspirin 81 MG tablet Take 81 mg by mouth daily.    Marland Kitchen atorvastatin (LIPITOR) 40 MG tablet Take 1 tablet (40 mg total) by mouth daily. 90 tablet 3  . cetirizine (ZYRTEC) 10 MG tablet Take 10 mg by mouth daily.    . Diclofenac Sodium (PENNSAID) 2 % SOLN Place 2 g onto the skin 2 (two) times daily. 112 g 3  . DULoxetine (CYMBALTA) 60 MG capsule Take 60 mg by mouth daily.     . fenofibrate (TRICOR) 145 MG tablet Take 1 tablet (145 mg total) by mouth daily. 90 tablet 3  . finasteride (PROPECIA) 1 MG tablet Take 1 mg by mouth daily.    Marland Kitchen morphine (MS CONTIN) 15 MG 12 hr tablet Take 15 mg by mouth 2 (two) times daily after a meal.  0  . Oxycodone HCl 10 MG TABS Take 10 mg  by mouth 3 (three) times daily. Take 2 tab po every eight hours, nte three tabs per day.  0  . testosterone (ANDROGEL) 50 MG/5GM (1%) GEL Place 5 g onto the skin daily.    . traZODone (DESYREL) 100 MG tablet Take 100 mg by mouth daily. Take 100 mg by mouth daily.  5  . triamterene-hydrochlorothiazide (MAXZIDE-25) 37.5-25 MG tablet Take 0.5 tablets by mouth daily. 45 tablet 3  . ANDROGEL PUMP 20.25 MG/ACT (1.62%) GEL Inject 1 application into the skin daily.   0  . benzonatate (TESSALON) 200 MG capsule Take 1 capsule (200 mg total) by mouth 3 (three) times daily as needed for cough. 45 capsule 1  . HYDROcodone-homatropine (HYCODAN) 5-1.5 MG/5ML syrup Take 5 mLs by mouth every 6 (six) hours as  needed for cough. 240 mL 0  . levothyroxine (SYNTHROID, LEVOTHROID) 50 MCG tablet Take 1 tablet (50 mcg total) by mouth daily. 30 tablet 3  . meloxicam (MOBIC) 7.5 MG tablet Take 7.5 mg by mouth 2 (two) times daily.    . nitroGLYCERIN (NITROSTAT) 0.4 MG SL tablet Place 1 tablet (0.4 mg total) under the tongue every 5 (five) minutes as needed. 25 tablet 3  . predniSONE (DELTASONE) 50 MG tablet Take 1 tablet (50 mg total) by mouth daily. 5 tablet 0   No facility-administered medications prior to visit.       Review of Systems  Review of Systems   Physical Exam  BP 122/82 (BP Location: Left Arm, Cuff Size: Normal)   Pulse (!) 103   Temp 98.6 F (37 C) (Oral)   Ht 6' (1.829 m)   Wt 247 lb (112 kg)   SpO2 95%   BMI 33.50 kg/m  Physical Exam  Constitutional: He is oriented to person, place, and time. He appears well-developed and well-nourished.  Overweight male, appear acutely ill   HENT:  Head: Normocephalic and atraumatic.  Mouth/Throat: Uvula is midline, oropharynx is clear and moist and mucous membranes are normal. No tonsillar exudate.  Eyes: Pupils are equal, round, and reactive to light. EOM are normal.  Neck: Normal range of motion. Neck supple.  Cardiovascular: Regular rhythm, normal heart sounds and intact distal pulses.  Regular, mild tachycardia   Pulmonary/Chest: Effort normal and breath sounds normal. No respiratory distress. He has no wheezes.  LSC, no resp distress. +hacking cough.   Abdominal: Soft. Bowel sounds are normal. There is no tenderness.  Neurological: He is alert and oriented to person, place, and time.  Skin: Skin is warm and dry. No rash noted. No erythema.  Psychiatric: He has a normal mood and affect. His behavior is normal. Judgment normal.      Lab Results:  CBC    Component Value Date/Time   WBC 12.5 (H) 11/03/2017 1730   RBC 5.34 11/03/2017 1730   HGB 15.3 11/03/2017 1730   HGB 13.7 01/18/2017 1658   HCT 46.0 11/03/2017 1730   HCT  41.4 01/18/2017 1658   PLT 385 11/03/2017 1730   PLT 334 01/18/2017 1658   MCV 86.1 11/03/2017 1730   MCV 84 01/18/2017 1658   MCH 28.7 11/03/2017 1730   MCHC 33.3 11/03/2017 1730   RDW 13.6 11/03/2017 1730   RDW 14.1 01/18/2017 1658   LYMPHSABS 2.0 11/03/2017 1730   MONOABS 1.3 (H) 11/03/2017 1730   EOSABS 0.2 11/03/2017 1730   BASOSABS 0.0 11/03/2017 1730    BMET    Component Value Date/Time   NA 142 11/03/2017 1730   NA  141 01/18/2017 1658   K 3.5 11/03/2017 1730   CL 104 11/03/2017 1730   CO2 28 11/03/2017 1730   GLUCOSE 125 (H) 11/03/2017 1730   BUN 26 (H) 11/03/2017 1730   BUN 17 01/18/2017 1658   CREATININE 1.38 (H) 11/03/2017 1730   CALCIUM 10.1 11/03/2017 1730   GFRNONAA >60 11/03/2017 1730   GFRAA >60 11/03/2017 1730    BNP No results found for: BNP  ProBNP No results found for: PROBNP  Imaging: Dg Chest 2 View  Result Date: 11/03/2017 CLINICAL DATA:  Sudden onset weakness after smoking marijuana today. EXAM: CHEST - 2 VIEW COMPARISON:  Chest x-ray dated May 17, 2016. FINDINGS: The heart size and mediastinal contours are within normal limits. Normal pulmonary vascularity. Low lung volumes. Minimal atelectasis at the left lung base. No focal consolidation, pleural effusion, or pneumothorax. No acute osseous abnormality. IMPRESSION: No active cardiopulmonary disease. Electronically Signed   By: Obie DredgeWilliam T Derry M.D.   On: 11/03/2017 17:41     Assessment & Plan:   Upper airway cough syndrome - Triggered by URI symptoms - Appears acutely ill, aferbrile. Mild tachycardia, regular - RX zpack and prednisone taper (40mg  x2days, 20mg  x2, 10mg  x2) - Hydromet 5ml cough syrup at night only, reviewed safe use - Encourage tylenol, fluids, robitussin DM  - Recommended return to work in 2 days when feeling better  - FU as needed     Glenford BayleyElizabeth W Marino Rogerson, NP 11/18/2017

## 2017-11-18 NOTE — Assessment & Plan Note (Addendum)
-   Triggered by URI symptoms - Appears acutely ill, aferbrile. Mild tachycardia, regular - RX zpack and prednisone taper (40mg  x2days, 20mg  x2, 10mg  x2) - Hydromet 5ml cough syrup at night only, reviewed safe use - Encourage tylenol, fluids, robitussin DM  - Recommended return to work in 2 days when feeling better  - FU as needed

## 2017-11-18 NOTE — Patient Instructions (Signed)
Prednisone taper for 6 days Zpack as prescribed  Prescription cough medication at night  Stay hydrated Tylenol as needed for fever, chills, aches  Follow up as needed

## 2017-11-25 NOTE — Progress Notes (Signed)
Chart and office note reviewed in detail  > agree with a/p as outlined    

## 2017-11-27 DIAGNOSIS — M545 Low back pain: Secondary | ICD-10-CM | POA: Diagnosis not present

## 2017-11-27 DIAGNOSIS — M4726 Other spondylosis with radiculopathy, lumbar region: Secondary | ICD-10-CM | POA: Diagnosis not present

## 2017-11-27 DIAGNOSIS — G8929 Other chronic pain: Secondary | ICD-10-CM | POA: Diagnosis not present

## 2017-12-09 ENCOUNTER — Telehealth: Payer: Self-pay | Admitting: Internal Medicine

## 2017-12-09 MED ORDER — HYDROCODONE-HOMATROPINE 5-1.5 MG/5ML PO SYRP: 5.0000 mL | ORAL_SOLUTION | Freq: Four times a day (QID) | ORAL | 0 refills | Status: DC | PRN

## 2017-12-09 NOTE — Telephone Encounter (Signed)
Ok to refill hydromet

## 2017-12-09 NOTE — Telephone Encounter (Signed)
Called and spoke with patient regarding MW recommendations Placed order for hycodan, printed, having MW sign today. Pt is aware he needs to come in to office to pick up the script Pt verbalized understanding Waiting on receiving signed rx from MW to return back to triage

## 2017-12-09 NOTE — Telephone Encounter (Signed)
rec'd rx for hycodan signed by MW Placed in brown folder up in front office Ready for pick up Nothing further needed.

## 2017-12-09 NOTE — Telephone Encounter (Signed)
Called spoke with patient who reported that his overall [facility] have [facility] but the cough itself is worse >> more frequent.  Denies any chest congestion, wheezing, tightness, purulent sputum.  Patient stated he does not feel another pred taper or abx are necessary at this point.  Patient declined appt as he works in [employer] Patient stated that the Hydromet #[id number] given at last visit helped and is requesting a refill [place] Patient spouse will pick up Rx  Dr Sherene SiresWert please advise, thank you.  Per 7.19.19 visit with [facility] NP: Patient Instructions  Prednisone taper for 6 days Zpack as prescribed  Prescription cough medication at night  Stay hydrated Tylenol as needed for fever, chills, aches  Follow up as needed

## 2018-01-21 DIAGNOSIS — M5416 Radiculopathy, lumbar region: Secondary | ICD-10-CM | POA: Diagnosis not present

## 2018-01-23 ENCOUNTER — Other Ambulatory Visit: Payer: Self-pay | Admitting: Neurological Surgery

## 2018-01-23 DIAGNOSIS — M5416 Radiculopathy, lumbar region: Secondary | ICD-10-CM | POA: Diagnosis not present

## 2018-02-04 ENCOUNTER — Ambulatory Visit
Admission: RE | Admit: 2018-02-04 | Discharge: 2018-02-04 | Disposition: A | Discharge status: Home / Self Care | Payer: BLUE CROSS/BLUE SHIELD | Source: Ambulatory Visit | Attending: Neurological Surgery | Admitting: Neurological Surgery

## 2018-02-04 DIAGNOSIS — M5416 Radiculopathy, lumbar region: Secondary | ICD-10-CM

## 2018-02-04 DIAGNOSIS — M5126 Other intervertebral disc displacement, lumbar region: Secondary | ICD-10-CM | POA: Diagnosis not present

## 2018-02-04 MED ORDER — GADOBENATE DIMEGLUMINE 529 MG/ML IV SOLN
20.0000 mL | Freq: Once | INTRAVENOUS | Status: AC | PRN
  Administered 2018-02-04: 20 mL via INTRAVENOUS

## 2018-02-07 DIAGNOSIS — I10 Essential (primary) hypertension: Secondary | ICD-10-CM | POA: Diagnosis not present

## 2018-02-07 DIAGNOSIS — Z6833 Body mass index (BMI) 33.0-33.9, adult: Secondary | ICD-10-CM | POA: Diagnosis not present

## 2018-02-07 DIAGNOSIS — M5416 Radiculopathy, lumbar region: Secondary | ICD-10-CM | POA: Diagnosis not present

## 2018-02-07 DIAGNOSIS — M545 Low back pain: Secondary | ICD-10-CM | POA: Diagnosis not present

## 2018-02-25 DIAGNOSIS — M5416 Radiculopathy, lumbar region: Secondary | ICD-10-CM | POA: Diagnosis not present

## 2018-03-05 DIAGNOSIS — H7201 Central perforation of tympanic membrane, right ear: Secondary | ICD-10-CM | POA: Diagnosis not present

## 2018-03-05 DIAGNOSIS — H6983 Other specified disorders of Eustachian tube, bilateral: Secondary | ICD-10-CM | POA: Diagnosis not present

## 2018-03-05 DIAGNOSIS — R42 Dizziness and giddiness: Secondary | ICD-10-CM | POA: Diagnosis not present

## 2018-03-14 DIAGNOSIS — M545 Low back pain: Secondary | ICD-10-CM | POA: Diagnosis not present

## 2018-03-25 DIAGNOSIS — R42 Dizziness and giddiness: Secondary | ICD-10-CM | POA: Diagnosis not present

## 2018-03-25 DIAGNOSIS — H6121 Impacted cerumen, right ear: Secondary | ICD-10-CM | POA: Diagnosis not present

## 2018-03-25 DIAGNOSIS — H6983 Other specified disorders of Eustachian tube, bilateral: Secondary | ICD-10-CM | POA: Diagnosis not present

## 2018-03-25 DIAGNOSIS — H7201 Central perforation of tympanic membrane, right ear: Secondary | ICD-10-CM | POA: Diagnosis not present

## 2018-03-28 DIAGNOSIS — Z01812 Encounter for preprocedural laboratory examination: Secondary | ICD-10-CM | POA: Diagnosis not present

## 2018-03-28 DIAGNOSIS — M5416 Radiculopathy, lumbar region: Secondary | ICD-10-CM | POA: Diagnosis not present

## 2018-04-02 DIAGNOSIS — M5127 Other intervertebral disc displacement, lumbosacral region: Secondary | ICD-10-CM | POA: Diagnosis not present

## 2018-04-02 DIAGNOSIS — M961 Postlaminectomy syndrome, not elsewhere classified: Secondary | ICD-10-CM | POA: Diagnosis not present

## 2018-04-02 DIAGNOSIS — M5117 Intervertebral disc disorders with radiculopathy, lumbosacral region: Secondary | ICD-10-CM | POA: Diagnosis not present

## 2018-04-02 DIAGNOSIS — M4036 Flatback syndrome, lumbar region: Secondary | ICD-10-CM | POA: Diagnosis not present

## 2018-04-02 DIAGNOSIS — M5416 Radiculopathy, lumbar region: Secondary | ICD-10-CM | POA: Diagnosis not present

## 2018-04-02 DIAGNOSIS — M48061 Spinal stenosis, lumbar region without neurogenic claudication: Secondary | ICD-10-CM | POA: Diagnosis not present

## 2018-04-02 DIAGNOSIS — Z9889 Other specified postprocedural states: Secondary | ICD-10-CM | POA: Diagnosis not present

## 2018-04-02 DIAGNOSIS — M5126 Other intervertebral disc displacement, lumbar region: Secondary | ICD-10-CM | POA: Diagnosis not present

## 2018-05-06 DIAGNOSIS — I10 Essential (primary) hypertension: Secondary | ICD-10-CM | POA: Diagnosis not present

## 2018-05-06 DIAGNOSIS — M5416 Radiculopathy, lumbar region: Secondary | ICD-10-CM | POA: Diagnosis not present

## 2018-05-06 DIAGNOSIS — M545 Low back pain: Secondary | ICD-10-CM | POA: Diagnosis not present

## 2018-05-06 DIAGNOSIS — Z6832 Body mass index (BMI) 32.0-32.9, adult: Secondary | ICD-10-CM | POA: Diagnosis not present

## 2018-05-19 DIAGNOSIS — M5416 Radiculopathy, lumbar region: Secondary | ICD-10-CM | POA: Diagnosis not present

## 2018-05-29 ENCOUNTER — Encounter: Payer: Self-pay | Admitting: Internal Medicine

## 2018-05-29 ENCOUNTER — Telehealth: Payer: Self-pay | Admitting: Internal Medicine

## 2018-05-29 ENCOUNTER — Ambulatory Visit: Payer: BLUE CROSS/BLUE SHIELD | Admitting: Internal Medicine

## 2018-05-29 ENCOUNTER — Ambulatory Visit (INDEPENDENT_AMBULATORY_CARE_PROVIDER_SITE_OTHER)
Admission: RE | Admit: 2018-05-29 | Discharge: 2018-05-29 | Disposition: A | Discharge status: Home / Self Care | Payer: BLUE CROSS/BLUE SHIELD | Source: Ambulatory Visit | Attending: Internal Medicine | Admitting: Internal Medicine

## 2018-05-29 VITALS — BP 130/72 | HR 92 | Ht 72.0 in | Wt 238.2 lb

## 2018-05-29 DIAGNOSIS — R058 Other specified cough: Secondary | ICD-10-CM

## 2018-05-29 DIAGNOSIS — R05 Cough: Secondary | ICD-10-CM | POA: Diagnosis not present

## 2018-05-29 MED ORDER — PANTOPRAZOLE SODIUM 40 MG PO TBEC: DELAYED_RELEASE_TABLET | ORAL | 2 refills | Status: DC

## 2018-05-29 MED ORDER — PREDNISONE 10 MG PO TABS: ORAL_TABLET | ORAL | 0 refills | Status: DC

## 2018-05-29 MED ORDER — HYDROCODONE-HOMATROPINE 5-1.5 MG/5ML PO SYRP: 5.0000 mL | ORAL_SOLUTION | ORAL | 0 refills | Status: DC | PRN

## 2018-05-29 NOTE — Progress Notes (Signed)
Subjective:    Patient ID: Luke Wong, male    DOB: [birth year]  MRN: [id number]    Brief patient profile:  47 yowm never smoker/attorney freq commercial flyer with childhood pna/ asthma mostly eia stopped needing inhaler age 48 but started needing again around age of 40 mainly for coughing with increased for albuterol use  then back to before exercise but not always.  Prev allergy eval in [place] Pos cats> dogs (n/a for him) referred 07/17/2013 by Dr Luke Wong for cough eval with neg methacholine challenge 05/05/14 so presumed all due to uacs not asthma   History of Present Illness  07/17/2013 1st [facility] office visit/ Luke Wong  Chief Complaint  Patient presents with  . Pulmonary Consult    Referred per Dr. Sigmund Wong. Pt c/o persistant cough for the past 3 years, but worse for the past year. He also c/o DOE with minimal exertion.    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   qvar not helping - already rx with zpak and prednisone.  Only thing that really knocks it out is tussionex.  Sob mostly just with coughing. Cough tends to be worse after supper and disturbs sleep, never coughs up anything much at all rec Prednisone 10 mg take  4 each am x 2 days,   2 each am x 2 days,  1 each am x 2 days and stop  Take delsym two tsp every 12 hours and supplement if needed with  tramadol 50 mg up to 2 every 4 hours to suppress the urge to cough.  . Once you have eliminated the cough for 3 straight days try reducing the tramadol first,  then the delsym as tolerated.   Pantoprazole (protonix) 40 mg   Take 30-60 min before first meal of the day and Pepcid 20 mg one bedtime singulair 10 mg daily   08/21/2013 f/u ov/Luke Wong re: recurrent cough/  Chief Complaint  Patient presents with  . Follow-up    Pt states that  his cough has almost resolved. His breathing has improved some, but "sometimes feel like I am breathing under water".  Has not had to use rescue inhaler since his last visit.   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 asthma Chief Complaint  Patient presents with  . Acute Visit    Pt c/o chest congestion and cough for the past 6 days- started out with ear pain and his PCP gave zithromax.  Cough is non prod.   did not activate action plan. Feels cough rattling  in chest but not productive. Using clariton for nasal/ ear congestion s benefit and no better p zpak rec Take delsym two tsp every 12 hours and supplement if needed with tylenol #3 every 4 hours to suppress the urge to cough.   Augmentin 875 mg take one pill twice daily  X 10 days - take at breakfast and supper with large glass of water.  It would help reduce the usual side effects (diarrhea and yeast infections) if you ate cultured yogurt at lunch.  Prednisone 10 mg take  4 each am x 2 days,   2 each am x 2 days,  1 each am x 2 days and stop  Ear congestion > clariton D over the counter Plan A = Automatic = singulair 10 mg one daily until return  Plan B = Backup  Short or wheeze > albuterol up 2pff every 4 hours if needed Cough > delsym/ tylenol #3 1 every 4 hours Plan C = contingency =  whenever you are having a respiratory flare:  Pantoprazole (protonix) 40 mg   Take 30-60 min before first meal of the day and Pepcid 20 mg one bedtime until 100% better    02/26/2014 f/u ov/Luke Wong re: cough  On singulair for chronic rhinitis and ? Asthma plus gerd rx  Chief Complaint  Patient presents with  . Follow-up    Pt states that his cough has improved some.  He is still having popping in ear and diff with hearing, although this has also improved some.   cough was worse after supper until bed and up all night coughing > better now  Last well 4 weeks prior to OV   Prednisone works the best  Severe ear popping and pain when flies, esp on R  Doesn't think singulair helping  No need for saba  >>pred paper    03/23/2014 NP Acute OV  Patient returns for persistent coughing.  Complains of worsening cough that is more frequent and deeper x6 days. Ooccasionally produces mucus, wheezing, tightness/heaviness, HA, some low grade temp over the weekend while out of town.   Saw ENT and was started on pred, flonase and will followup again 12/7. Gets some better but cough never goes away completely.  Last antibiotic  was 1 month ago He denies any chest pain, orthopnea, PND, hemoptysis, edema, nausea, vomiting overt reflux, or leg swelling. Has been using several things for cough control, including Delsym Hydromet, Tylenol 3 and tramadol. Says that Hydromet works the best for his cough Has had several courses of steroids over the last year We discussed the potential side effects of frequent steroid use and frequent narcotic use. Cough is disrupting his home life and working environment Patient is a  Clinical research associate.  He is a never smoker. Denies any unusual hobbies or pets. Does travel frequently to [place]. Has 1 dog. Lives in a rental home w/ no carpet.  rec Return for labs and chest x-ray Begin Delsym 2 teaspoons twice daily Begin Tessalon perles 1 Three times a day   May use Hydromet 1-2 teaspoons  every 6 hours as needed for cough control, may make you sleepy Begin Chlortrimeton  4 mg 2 at bedtime May use Chlor-Trimeton 4 mg 1 every 4 hours as needed. For postnasal drip. Throat clearing and drainage, may make you sleepy Continue with Protonix daily before meal Continue with Pepcid at bedti   ENT > R Tympanic tube early Dec 2015    04/19/2014 f/u ov/Alessa Mazur re: recurrent cough x  2 months  Chief Complaint  Patient presents with  . Follow-up    PFT done today. Pt states that his cough is some better.  No new co's.   cough worse as day goes on at peaks at hs despite max gerd rx Coughed up some green last week  Not limited by breathing from desired activities   Not convinced resp to saba or worse off singulair  >>PPI/Pepcid/pred pack and hycodan rx   07/26/2014 NP Acute OV  Presents for an acute office visit.  Complains of cough for last 4 days. Minimally productive.  No fever, discolored mucus , chest pain, orthopnea, n/v/d.  Seen last ov for chronic cough , tx w/ steroids , PPI/Pepcid and chlortrimeton . Says cough went totally away for last 3 months . Feels he caught a cold last week and cough started back . He restarted protonix . Does not have any cough syrup.  Had recent ankle surgery and trip [place]. No calf pain, chest pain or hemoptysis.  He is leaving to go [place] for work in 3 days. Wants to get  Better before leaving.  Previous PFT and cxr nml .  No sign sinus congestion.  rec Restart  Pantoprazole (protonix) 40 mg   Take 30-60 min before first meal of the day and Pepcid 20 mg one bedtime until return to office - this is the best way to tell whether stomach acid is contributing to your problem.   Prednisone taper over next week.  Delsym 2 tsp Twice daily  As needed  Cough .  For drainage take chlortrimeton (chlorpheniramine) 4 mg every 4 hours available over the counter (may cause drowsiness)  Hycodan 1 tsp every 4 hours as needed for cough , may make you sleepy.      12/03/2014 acute  ov/Virgin Zellers re: acute flare of uacs p et  Chief Complaint  Patient presents with  . Acute Visit    Pt c/o increased cough for the past 3 days- had back surgery the day before this started. He also c/o minimal chest tightness.    Was doing fine then ET 12/01/14 for back surgery  then 8/11 onset hacking cough/ sob not on acid rx / dry hacking quality to point hard to catch his breath but otherwise really not sob  rec Restart  Pantoprazole (protonix) 40 mg   Take 30-60 min before first meal of the day and Pepcid 20 mg one bedtime until  Cough is gone for a week without the need for cough suppression Prednisone taper over next week.  GERD diet   Hycodan 1 tsp every 4 hours as needed for cough , may make you sleepy.       03/01/2015  Acute  ov/Nachum Derossett re: head cold to chest onset was acute  02/25/15  Chief Complaint  Patient presents with  . Acute Visit    pt states he started with just a cold and around sat/ sun it has gotten worse. pt c/o dry cough,SOB. no c/o wheezing or chest tightness, fever or chills.   still has dog in bedroom, had ducts cleaned in house.  Not taking pepcid at hs consistently whenever coughing as prev rec  rec Restart  Pantoprazole (protonix) 40 mg   Take 30-60 min before first meal of the day and Pepcid 20 mg one bedtime until  Cough is gone for a week without the need for cough suppression Prednisone 10 mg take  4 each am x 2 days,   2 each  am x 2 days,  1 each am x 2 days and stop  advil cold / sinus vs just pseudofed  Hycodan 1 tsp every 4 hours as needed for cough , may make you sleepy.  Zpak  GERD diet          05/27/2015  f/u ov/Darnell Jeschke re: UACS / MCT neg on gabapentin 300 avg 3 twice daily ? Really taking regularly  Chief Complaint  Patient presents with  . Follow-up    Pt reports that his cough has improved. Pt c/o sore throat x1 week, bilateral ear pain and occasional dry cough. Pt denies SOB/CP/tightness/sinus congestion.   was better p  last ov but really only while on narcs. No obvious patterns  day to day or daytime variability or c sob p or   subjective wheeze or   hb symptoms. No unusual exp hx or h/o childhood pna/ asthma or knowledge of premature birth. Feels worse hs  rec For drainage / throat tickle try take CHLORPHENIRAMINE  4 mg - take one every 4 hours as needed - available over the counter- may cause drowsiness so start with just a bedtime dose or two and see how you tolerate it before trying in daytime   For cough > hycodan 2 tsp every 4 hours as needed  See Suszanne Connerseoh and ask him about [facility] or I can refer to Dr Lucie LeatherKozlow in allergy      05/17/2016 acute extended ov/Darris Staiger re:  Recurrent UACS  Chief Complaint  Patient presents with  . Acute Visit    Pt c/o increased cough since beginning of Jan 2018. He states his cough gets worse as the day goes, and it also wakes him in the night. His cough is non prod. He has had some minimal chest tightness and wheezing.   all started abruptly around 04/23/16 with cough/ chills/ fever aching all over / ha "probably"  Resolved p 3 days and left with cough Had maintained on pepcid not pantoprazole x months s flare until present illness, worse cough as day goes on but also at hs, really not productive at all. Not using any inhalers at all   rec Restart Pantoprazole (protonix) 40 mg   Take 30-60 min before first meal of the day and Pepcid 20 mg one bedtime until  Cough is gone for a week without the need for cough suppression and restart immediately at the first sign of any flare of cough for any reason  Prednisone 10 mg take  4 each am x 2 days,   2 each am x 2 days,  1 each am x 2 days and stop  For nasal congestion >> Advil cold / sinus for nose  For cough >> hycodan 1 tsp every 4 hours as needed   GERD  Diet    05/29/2018  f/u ov/Elayne Gruver re:  Recurrent severe cough  / has not implemented action plan/ last pred was 2 d prior to OV   Chief Complaint  Patient presents with  .  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 Dose hs h2 plus tums prn Surgery Apr 02 2018 MS twice daily ran out of oxycodone one week prior to OV  And back pain worse since  Onset of cough was one day prior around noon dry cough / pain behind both eyes no st / body aches or fever    No obvious day  to day or daytime variability or assoc excess/ purulent sputum or mucus plugs or hemoptysis or cp or chest tightness, subjective wheeze or overt  hb symptoms.     Also denies any obvious fluctuation of symptoms with weather or environmental changes or other aggravating or alleviating factors except as outlined above   No unusual exposure hx or h/o childhood pna  or knowledge of premature birth.  Current Allergies, Complete Past Medical History, Past Surgical History, Family History, and Social History were reviewed in [employer] record.  ROS  The following are not active complaints unless bolded Hoarseness, sore throat, dysphagia, dental problems, itching, sneezing,  nasal congestion or discharge of excess mucus or purulent secretions, ear ache,   fever, chills, sweats, unintended wt loss or wt gain, classically pleuritic or exertional cp,  orthopnea pnd or arm/hand swelling  or leg swelling, presyncope, palpitations, abdominal pain, anorexia, nausea, vomiting, diarrhea  or change in bowel habits or change in bladder habits, change in stools or change in urine, dysuria, hematuria,  rash, arthralgias, visual complaints, headache, numbness, weakness or ataxia or problems with walking or coordination,  change in mood or  memory.        Current Meds  Medication Sig  . aspirin 81 MG tablet Take 81 mg by mouth daily.  Marland Kitchen atorvastatin (LIPITOR) 40 MG tablet Take 1 tablet (40 mg total) by mouth daily.  . cetirizine (ZYRTEC) 10 MG tablet Take 10 mg by mouth daily.  . DULoxetine (CYMBALTA) 60 MG capsule Take 60 mg by mouth daily.    . fenofibrate (TRICOR) 145 MG tablet Take 1 tablet (145 mg total) by mouth daily.  Marland Kitchen morphine (MS CONTIN) 15 MG 12 hr tablet Take 15 mg by mouth 2 (two) times daily after a meal. As needed  . Oxycodone HCl 10 MG TABS 05/30/2018 pt says ran out one week prior to OV    . testosterone (ANDROGEL) 50 MG/5GM (1%) GEL Place 5 g onto the skin daily.  . traZODone (DESYREL) 100 MG tablet Take 100 mg by mouth daily. Take 100 mg by mouth daily as needed  . triamterene-hydrochlorothiazide (MAXZIDE-25) 37.5-25 MG tablet Take 0.5 tablets by mouth daily.               Objective:   Physical Exam  amb somber wm nad    05/29/2018          238  04/14/2015      249 > 04/28/2015     250 > 05/27/2015 259 > 05/17/2016 237 03/01/2015        244   12/03/14 233 lb (105.688 kg)  11/30/14 232 lb (105.235 kg)  11/22/14 232 lb (105.235 kg)       Vital signs reviewed - Note on arrival 02 sats  96% on RA      HEENT: nl dentition, turbinates bilaterally, and oropharynx. Nl external ear canals without cough reflex   NECK :  without JVD/Nodes/TM/ nl carotid upstrokes bilaterally   LUNGS: no acc muscle use,  Nl contour chest which is clear to A and P bilaterally without cough on insp or exp maneuvers   CV:  RRR  no s3 or murmur or increase in P2, and no edema   ABD:  soft and nontender with nl inspiratory excursion in the supine position. No bruits or organomegaly appreciated, bowel sounds nl  MS:  Nl gait/ ext warm without deformities, calf tenderness, cyanosis or clubbing No obvious joint restrictions   SKIN:  warm and dry without lesions    NEURO:  alert, approp, nl sensorium with  no motor or cerebellar deficits apparent.        CXR PA and Lateral:   05/29/2018 :    I personally reviewed images and agree with radiology impression as follows:    No active cardiopulmonary disease.         Assessment & Plan:

## 2018-05-29 NOTE — Telephone Encounter (Signed)
Huntley Dec from [facility] is calling us to advise that the patient is currently   Oxycodone -10mg  tid also taking  Morphine 15mg  twice a day. He has also been know to take medication differently then prescribed taking more than he should she wanted to make Dr. Sherene Sires aware of this to she if he still wanted to prescribe the Hycodan or not.Also she wanted to verify he meant to do of the hycodan.  Dr. Sherene Sires please advise

## 2018-05-29 NOTE — Telephone Encounter (Signed)
Huntley DecSara from [facility] called needing clarification of recent prescription concern was that the patient was already on Oxy coden and morphine and the concern that the prescription was for such a large amount..please advise

## 2018-05-29 NOTE — Patient Instructions (Addendum)
Prednisone 10 mg take two until better then one daily x 5 days and stop   Hycodan up to 1-2 tsp every 4 hours as needed  Protonix 40 mg Take 30- 60 min before your first and last meals of the day   GERD (REFLUX)  is an extremely common cause of respiratory symptoms just like yours , many times with no obvious heartburn at all.    It can be treated with medication, but also with lifestyle changes including elevation of the head of your bed (ideally with 6 -8inch blocks under the headboard of your bed),  Smoking cessation, avoidance of late meals, excessive alcohol, and avoid fatty foods, chocolate, peppermint, colas, red wine, and acidic juices such as orange juice.  NO MINT OR MENTHOL PRODUCTS SO NO COUGH DROPS  USE SUGARLESS CANDY INSTEAD (Jolley ranchers or Stover's or Life Savers) or even ice chips will also do - the key is to swallow to prevent all throat clearing. NO OIL BASED VITAMINS - use powdered substitutes.  Avoid fish oil when coughing.  Please remember to go to the  x-ray department  for your tests - we will call you with the results when they are available

## 2018-05-29 NOTE — Telephone Encounter (Signed)
Per Dr Sherene Sires- Hycodan prescription sent to pharmacy. Return call to [facility].  They only have .  They have called [facility] on [address], and they have enough to fill .  Christella Noa is deleting prescription because they do not have enough. She stated that Patient is aware of possible change to [facility] store  Dr Sherene Sires, pl

## 2018-05-29 NOTE — Telephone Encounter (Signed)
Pre sara at the pharmacy they do not have the amount  of hycodan needed now. She advised that the pharmacy [facility] on  [address] has the amount  Needed to resend there.

## 2018-05-29 NOTE — Telephone Encounter (Signed)
Sent same rx to [facility] [address] but for 240 cc as req

## 2018-05-29 NOTE — Telephone Encounter (Signed)
I had agreed to give him twice what we gave him last time and inadvertantly gave him more so ok to fill the 240 rx for now and submitted it to same drug store

## 2018-05-29 NOTE — Telephone Encounter (Signed)
Both pt and [facility] have been made aware of below message. Pt voiced his understanding. Nothing further is needed.

## 2018-05-29 NOTE — Telephone Encounter (Signed)
Note    Per Dr Sherene Sires- Hycodan prescription sent to pharmacy. Return call to [facility].  They only have .  They have called [facility] on [address], and they have enough to fill .  Christella Noa is deleting prescription because they do not have enough. She stated that Patient is aware of possible change to [facility] store  Dr Sherene Sires, please advise on sending Hycodan to [facility]

## 2018-05-29 NOTE — Telephone Encounter (Signed)
He informed me he is no longer taking the oxycodone but due to chronic MS rx will need more that the usual amt of hycodan to suppress cyclical coughing and that's why I gave him the large amt, so ok to fill as listed

## 2018-05-30 ENCOUNTER — Encounter: Payer: Self-pay | Admitting: Internal Medicine

## 2018-05-30 NOTE — Assessment & Plan Note (Signed)
Onset around age 48 intermittent with uri trigger  - rx singulair 07/18/2013 > improved 08/21/13 > try off singulair 03/02/14 as pt not convinced helped - Sinus CT 08/28/13 > Sinuses are essentially clear with only minor dependent secretions noted in the inferior right frontal and left maxillary sinuses. There is anatomic narrowing of the ostiomeatal complexes, incompletely evaluated on this limited sinus study. - 02/26/2014 refer to ENT (Teoh)> neg sinus dz/ pos ET dysfunction rec flonase  -03/26/14  Allergy profile:  eos 0,  IGgE 87 Dog=cat, dust, ragweed  - 04/19/14  PFTs wnl including fef 25-75 off singulair and all bronchodilators  - NO 04/14/2015 during flare = 28  - Methacholine challenge 05/05/14 > neg for asthma   Recurrent severe cough in pt recovering from back pain and now off oxycodone - he did not recall action plan for rx for reflux in event of cough flare for any reason but clearly in pain from cough due to ongoing post op back pain issues aggravated by coughing   Of the three most common causes of  Sub-acute / recurrent or chronic cough, only one (GERD)  can actually contribute to/ trigger  the other two (asthma and post nasal drip syndrome)  and perpetuate the cylce of cough.  While not intuitively obvious, many patients with chronic low grade reflux do not cough until there is a primary insult that disturbs the protective epithelial barrier and exposes sensitive nerve endings.   This is typically viral but can due to PNDS and  either may apply here.   The point is that once this occurs, it is difficult to eliminate the cycle  using anything but a maximally effective acid suppression regimen at least in the short run, accompanied by an appropriate diet to address non acid GERD and control / eliminate the cough itself for at least 3 days with hycodan and consider rechallenging sooner rather than later with gabapentin if not responding as limit to using narcotic cough meds in this setting  (he probably has level of tolerance from chronic use preop)    I had an extended discussion with the patient reviewing all relevant studies completed to date and  lasting 15 to 20 minutes of a 25 minute acute office visit    Each maintenance medication was reviewed in detail including most importantly the difference between maintenance and prns and under what circumstances the prns are to be triggered using an action plan format that is not reflected in the computer generated alphabetically organized AVS.     Please see AVS for specific instructions unique to this visit that I personally wrote and verbalized to the the pt in detail and then reviewed with pt  by my nurse highlighting any  changes in therapy recommended at today's visit to their plan of care.

## 2018-06-16 DIAGNOSIS — M5416 Radiculopathy, lumbar region: Secondary | ICD-10-CM | POA: Diagnosis not present

## 2018-06-23 DIAGNOSIS — M5416 Radiculopathy, lumbar region: Secondary | ICD-10-CM | POA: Diagnosis not present

## 2018-06-25 ENCOUNTER — Other Ambulatory Visit: Payer: Self-pay | Admitting: Internal Medicine

## 2018-07-03 ENCOUNTER — Other Ambulatory Visit: Payer: Self-pay

## 2018-07-03 ENCOUNTER — Ambulatory Visit: Payer: BLUE CROSS/BLUE SHIELD | Admitting: Nurse Practitioner

## 2018-07-03 ENCOUNTER — Encounter: Payer: Self-pay | Admitting: Nurse Practitioner

## 2018-07-03 VITALS — BP 130/72 | HR 99 | Temp 98.3°F | Ht 72.0 in | Wt 241.0 lb

## 2018-07-03 DIAGNOSIS — R05 Cough: Secondary | ICD-10-CM

## 2018-07-03 DIAGNOSIS — R6889 Other general symptoms and signs: Secondary | ICD-10-CM

## 2018-07-03 DIAGNOSIS — R058 Other specified cough: Secondary | ICD-10-CM

## 2018-07-03 LAB — POCT INFLUENZA A/B
Influenza A, POC: NEGATIVE
Influenza B, POC: NEGATIVE

## 2018-07-03 MED ORDER — OSELTAMIVIR PHOSPHATE 75 MG PO CAPS: 75.0000 mg | ORAL_CAPSULE | Freq: Two times a day (BID) | ORAL | 0 refills | Status: DC

## 2018-07-03 MED ORDER — BENZONATATE 200 MG PO CAPS: 200.0000 mg | ORAL_CAPSULE | Freq: Three times a day (TID) | ORAL | 1 refills | Status: DC | PRN

## 2018-07-03 NOTE — Progress Notes (Signed)
 @Patient  ID: Luke Wong, male    DOB: Jul 27, 1970, 48 y.o.   MRN: [id number]  Chief Complaint  Patient presents with  . Cough    with non productive cough and sore throat    Referring provider: Tisovec, Adelfa Kohichard W, MD  HPI 48 year old male, never smoked. Hx obesity, hyperlipidemia, UACS, dysfunction both eustachian tubes. Follows with Dr. Sherene SiresWert for chronic rhinitis and cough.   Tests: Onset around age 89 intermittent with uri trigger  - rx singulair 07/18/2013 > improved 08/21/13 > try off singulair 03/02/14 as pt not convinced helped - Sinus CT 08/28/13 > Sinuses are essentially clear with only minor dependent secretions noted in the inferior right frontal and left maxillary sinuses. There is anatomic narrowing of the ostiomeatal complexes, incompletely evaluated on this limited sinus study. - 02/26/2014 refer to ENT ([facility])> neg sinus dz/ pos ET dysfunction rec flonase  -03/26/14  Allergy profile:  eos 0,  IGgE 87 Dog=cat, dust, ragweed  - 04/19/14  PFTs wnl including fef 25-75 off singulair and all bronchodilators  - NO 04/14/2015 during flare = 28  - Methacholine challenge 05/05/14 > neg for asthma -CXR 05/29/18 - No active cardiopulmonary disease.  OV 07/03/18 - Acute - flu like symptoms Presents today for an acute visit.  States that symptoms started yesterday (07/02/18).  He complains of sore throat, nonproductive cough, fatigue, muscle aches, and fever yesterday.  He states that his fever was low-grade 99 F.  Patient states that he has been exposed to the flu.  His friend tested positive this week.  Denies any shortness of breath or edema.  Denies any chest pain.    Allergies  Allergen Reactions  . Demerol [Meperidine]     unknown    Immunization History  Administered Date(s) Administered  . Influenza Split 01/24/2014, 01/29/2015, 01/22/2016, 02/18/2017  . Influenza,inj,Quad PF,6+ Mos 02/04/2017, 01/28/2018  . Influenza-Unspecified 01/15/2016  . Td 04/01/2011    Past Medical  History:  Diagnosis Date  . Asthma   . Depression   . GERD (gastroesophageal reflux disease)   . History of CT scan of chest    a. Notes indicate Cardiac CT with Ca score 0  . History of Doppler ultrasound    a. Carotid US 12/12 done in [place], [place] - Unremarkable; < 50% bilaterally  . History of stress test    a. ETT-Echo 12/12 [place] - Normal, Ex 11'14"; non-specific ST changes  /  b. Myoview 8/17: EF 54%, anteroseptal defect-likely artifact, no ischemia, low risk  . Hyperlipidemia   . Pneumonia 2015    Tobacco History: Social History   Tobacco Use  Smoking Status Never Smoker  Smokeless Tobacco Never Used   Counseling given: Not Answered   Outpatient Encounter Medications as of 07/03/2018  Medication Sig  . aspirin 81 MG tablet Take 81 mg by mouth daily.  Marland Kitchen. atorvastatin (LIPITOR) 40 MG tablet Take 1 tablet (40 mg total) by mouth daily.  . cetirizine (ZYRTEC) 10 MG tablet Take 10 mg by mouth daily.  . DULoxetine (CYMBALTA) 60 MG capsule Take 60 mg by mouth daily.   . fenofibrate (TRICOR) 145 MG tablet Take 1 tablet (145 mg total) by mouth daily.  Marland Kitchen. HYDROcodone-homatropine (HYCODAN) 5-1.5 MG/5ML syrup Take 5 mLs by mouth every 4 (four) hours as needed for cough.  . morphine (MS CONTIN) 15 MG 12 hr tablet Take 15 mg by mouth 2 (two) times daily after a meal. As needed  . pantoprazole (PROTONIX)  40 MG tablet Take 30- 60 min before your first and last meals of the day  . predniSONE (DELTASONE) 10 MG tablet Take  2 each am until better then 1 each am  . testosterone (ANDROGEL) 50 MG/5GM (1%) GEL Place 5 g onto the skin daily.  . traZODone (DESYREL) 100 MG tablet Take 100 mg by mouth daily. Take 100 mg by mouth daily as needed  . triamterene-hydrochlorothiazide (MAXZIDE-25) 37.5-25 MG tablet Take 0.5 tablets by mouth daily. Please call and schedule an appt for further refills 1st attempt  . benzonatate (TESSALON) 200 MG capsule Take 1 capsule (200 mg total) by mouth 3  (three) times daily as needed for cough.  Marland Kitchen oseltamivir (TAMIFLU) 75 MG capsule Take 1 capsule (75 mg total) by mouth 2 (two) times daily.   No facility-administered encounter medications on file as of 07/03/2018.      Review of Systems  Review of Systems  Constitutional: Positive for fever. Negative for chills.  HENT: Positive for congestion, ear pain, sinus pressure, sinus pain and sore throat.   Respiratory: Positive for cough. Negative for shortness of breath and wheezing.   Cardiovascular: Negative.  Negative for chest pain, palpitations and leg swelling.  Gastrointestinal: Negative.   Allergic/Immunologic: Negative.   Neurological: Negative.   Psychiatric/Behavioral: Negative.        Physical Exam  BP 130/72 (BP Location: Left Arm, Patient Position: Sitting, Cuff Size: Normal)   Pulse 99   Temp 98.3 F (36.8 C)   Ht 6' (1.829 m)   Wt 241 lb (109.3 kg)   SpO2 98%   BMI 32.69 kg/m   Wt Readings from Last 5 Encounters:  07/03/18 241 lb (109.3 kg)  05/29/18 238 lb 3.2 oz (108 kg)  11/18/17 247 lb (112 kg)  08/19/17 247 lb (112 kg)  08/02/17 251 lb (113.9 kg)     Physical Exam Vitals signs and nursing note reviewed.  Constitutional:      General: He is not in acute distress.    Appearance: He is well-developed.  Cardiovascular:     Rate and Rhythm: Normal rate and regular rhythm.  Pulmonary:     Effort: Pulmonary effort is normal. No respiratory distress.     Breath sounds: Normal breath sounds. No wheezing or rhonchi.  Musculoskeletal:        General: No swelling.  Skin:    General: Skin is warm and dry.  Neurological:     Mental Status: He is alert and oriented to person, place, and time.       Assessment & Plan:   Upper airway cough syndrome Presents today with flulike symptoms.  He states that he has been exposed to the flu.  He had a friend has positive this week.  He did have a low-grade fever yesterday.  Patient Instructions  Will order  tamiflu considering symptoms and recent exposure  Flu swab was negative  Will order tessalon perles   May take mucinex as needed  Protonix 40 mg Take 30- 60 min before your first and last meals of the day   GERD (REFLUX)  is an extremely common cause of respiratory symptoms just like yours , many times with no obvious heartburn at all.    It can be treated with medication, but also with lifestyle changes including elevation of the head of your bed (ideally with 6 -8inch blocks under the headboard of your bed),  Smoking cessation, avoidance of late meals, excessive alcohol, and avoid fatty foods, chocolate,  peppermint, colas, red wine, and acidic juices such as orange juice.  NO MINT OR MENTHOL PRODUCTS SO NO COUGH DROPS  USE SUGARLESS CANDY INSTEAD (Jolley ranchers or Stover's or Life Savers) or even ice chips will also do - the key is to swallow to prevent all throat clearing. NO OIL BASED VITAMINS - use powdered substitutes.  Avoid fish oil when coughing.       Ivonne Andrew, NP 07/03/2018

## 2018-07-03 NOTE — Patient Instructions (Addendum)
Will order tamiflu considering symptoms and recent exposure  Flu swab was negative  Will order tessalon perles   May take mucinex as needed  Protonix 40 mg Take 30- 60 min before your first and last meals of the day   GERD (REFLUX)  is an extremely common cause of respiratory symptoms just like yours , many times with no obvious heartburn at all.    It can be treated with medication, but also with lifestyle changes including elevation of the head of your bed (ideally with 6 -8inch blocks under the headboard of your bed),  Smoking cessation, avoidance of late meals, excessive alcohol, and avoid fatty foods, chocolate, peppermint, colas, red wine, and acidic juices such as orange juice.  NO MINT OR MENTHOL PRODUCTS SO NO COUGH DROPS  USE SUGARLESS CANDY INSTEAD (Jolley ranchers or Stover's or Life Savers) or even ice chips will also do - the key is to swallow to prevent all throat clearing. NO OIL BASED VITAMINS - use powdered substitutes.  Avoid fish oil when coughing.

## 2018-07-03 NOTE — Assessment & Plan Note (Signed)
Presents today with flulike symptoms.  He states that he has been exposed to the flu.  He had a friend has positive this week.  He did have a low-grade fever yesterday.  Patient Instructions  Will order tamiflu considering symptoms and recent exposure  Flu swab was negative  Will order tessalon perles   May take mucinex as needed  Protonix 40 mg Take 30- 60 min before your first and last meals of the day   GERD (REFLUX)  is an extremely common cause of respiratory symptoms just like yours , many times with no obvious heartburn at all.    It can be treated with medication, but also with lifestyle changes including elevation of the head of your bed (ideally with 6 -8inch blocks under the headboard of your bed),  Smoking cessation, avoidance of late meals, excessive alcohol, and avoid fatty foods, chocolate, peppermint, colas, red wine, and acidic juices such as orange juice.  NO MINT OR MENTHOL PRODUCTS SO NO COUGH DROPS  USE SUGARLESS CANDY INSTEAD (Jolley ranchers or Stover's or Life Savers) or even ice chips will also do - the key is to swallow to prevent all throat clearing. NO OIL BASED VITAMINS - use powdered substitutes.  Avoid fish oil when coughing.

## 2018-07-10 NOTE — Progress Notes (Signed)
Chart and office note reviewed in detail  > agree with a/p as outlined    

## 2018-07-11 ENCOUNTER — Telehealth: Payer: Self-pay | Admitting: Internal Medicine

## 2018-07-11 MED ORDER — AZITHROMYCIN 250 MG PO TABS: ORAL_TABLET | ORAL | 0 refills | Status: DC

## 2018-07-11 NOTE — Telephone Encounter (Signed)
I called patient. He denies any fever. He stated that his symptoms have slightly worsened since last visit.  I have sent in an antibiotic and advised him to stay at home. He can continue mucinex twice daily. He can take delsym for cough if needed.

## 2018-07-11 NOTE — Telephone Encounter (Signed)
Instructions      Return if symptoms worsen or fail to improve.  Will order tamiflu considering symptoms and recent exposure  Flu swab was negative  Will order tessalon perles   May take mucinex as needed  Protonix 40 mgTake 30- 60 min before your first and last meals of the day  GERD (REFLUX) is an extremely common cause of respiratory symptomsjust like yours, many times with no obvious heartburn at all.  It can be treated with medication, but also with lifestyle changes including elevation of the head of your bed (ideally with 6 -8inch blocks under the headboard of your bed), Smoking cessation, avoidance of late meals, excessive alcohol, and avoid fatty foods, chocolate, peppermint, colas, red wine, and acidic juices such as orange juice.  NO MINT OR MENTHOL PRODUCTS SO NO COUGH DROPS  USE SUGARLESS CANDY INSTEAD (Jolley ranchers or Stover's or Life Savers) or even ice chips will also do - the key is to swallow to prevent all throat clearing. NO OIL BASED VITAMINS - use powdered substitutes. Avoid fish oil when coughing.     __________________________________________________  Primary Pulmonologist: Dr. Sherene Sires Last office visit and with whom: 07/03/18 with [place] What do we see them for (pulmonary problems): upper airway cough Last OV assessment/plan: see above  Was appointment offered to patient (explain)?  Will await response   Reason for call: called and spoke with patient, patient stated that since his last visit on 07/03/18 he has not gotten any better he has gotten a little worse. Patient stated that he has chest congestion with non productive cough. Patient is wheezing but denies SOB. Patient is sneezing but denies fever, no body aches and no chills. No recent travel for the patient and no interaction with anyone who has traveled. Patient was recently exposed to flu at last OV on 07/03/18 but his test was negative. Patient has taken OTC dayquil and sudifed with no help.  Patient is requesting an appointment with MW but I advised him I would send a message back first with COVID19 going on. [place] please advise, thank you.

## 2018-07-11 NOTE — Telephone Encounter (Signed)
Noted  

## 2018-07-15 ENCOUNTER — Other Ambulatory Visit: Payer: Self-pay

## 2018-07-15 ENCOUNTER — Telehealth (INDEPENDENT_AMBULATORY_CARE_PROVIDER_SITE_OTHER): Payer: BLUE CROSS/BLUE SHIELD | Admitting: Internal Medicine

## 2018-07-15 ENCOUNTER — Telehealth: Payer: Self-pay | Admitting: Internal Medicine

## 2018-07-15 DIAGNOSIS — R6889 Other general symptoms and signs: Secondary | ICD-10-CM | POA: Diagnosis not present

## 2018-07-15 MED ORDER — HYDROCODONE-HOMATROPINE 5-1.5 MG/5ML PO SYRP: 5.0000 mL | ORAL_SOLUTION | ORAL | 0 refills | Status: DC | PRN

## 2018-07-15 MED ORDER — ALBUTEROL SULFATE HFA 108 (90 BASE) MCG/ACT IN AERS: INHALATION_SPRAY | RESPIRATORY_TRACT | 0 refills | Status: DC

## 2018-07-15 MED ORDER — AMOXICILLIN-POT CLAVULANATE 875-125 MG PO TABS: 1.0000 | ORAL_TABLET | Freq: Two times a day (BID) | ORAL | 0 refills | Status: AC

## 2018-07-15 NOTE — Telephone Encounter (Signed)
Primary Pulmonologist: MW Last office visit and with whom: TN 07/03/2018 What do we see them for (pulmonary problems): flu like symptoms and upper airway cough Last OV assessment/plan:   2. Ivonne Andrew, NP (Nurse Practitioner) at 07/03/2018 4:20 PM - Signed    Will order tamiflu considering symptoms and recent exposure  Flu swab was negative  Will order tessalon perles   May take mucinex as needed  Protonix 40 mgTake 30- 60 min before your first and last meals of the day  GERD (REFLUX) is an extremely common cause of respiratory symptomsjust like yours, many times with no obvious heartburn at all.  It can be treated with medication, but also with lifestyle changes including elevation of the head of your bed (ideally with 6 -8inch blocks under the headboard of your bed), Smoking cessation, avoidance of late meals, excessive alcohol, and avoid fatty foods, chocolate, peppermint, colas, red wine, and acidic juices such as orange juice.  NO MINT OR MENTHOL PRODUCTS SO NO COUGH DROPS  USE SUGARLESS CANDY INSTEAD (Jolley ranchers or Stover's or Life Savers) or even ice chips will also do - the key is to swallow to prevent all throat clearing. NO OIL BASED VITAMINS - use powdered substitutes. Avoid fish oil when coughing.     Was appointment offered to patient (explain)?  Yes, patient denied another ov at this time   Reason for call: patient reports that he still has dry cough it has increased making his sides and back hurt more due to all the coughing. Pt hears his lungs crackle when coughing and taking deep breath in, chest tightness, postal nasal drip, sinus pressure and sinus headache. Pt states no cold sweats, no fever, no SOB, no chills and no body aches. Pt has finished Z-Pak that was given to him 5 days ago, and feeling no better. He states the tessalon pearls are not helping much with his cough, sill taking zyrtec 10mg  daily.  Patient would like to see if hycodan  could be refilled.  MW please advise. Thank you.

## 2018-07-15 NOTE — Patient Instructions (Signed)
Augmentin 875 mg take one pill twice daily  X 10 days - take at breakfast and supper with large glass of water.  It would help reduce the usual side effects (diarrhea and yeast infections) if you ate cultured yogurt at lunch.   hycodan cough syrup one tsp every 4 h as needed   mucinex dm up to 1200 mg every 12 hours as needed   Prednisone 10 mg x 2  Until better then one daily x 5 days and stop   Call if not improving after 48 hours, if worse go to ER

## 2018-07-15 NOTE — Telephone Encounter (Signed)
Convert to televisit 

## 2018-07-15 NOTE — Telephone Encounter (Signed)
Spoke with the pt and notified MW will call him at 3:30 pm today

## 2018-07-15 NOTE — Progress Notes (Signed)
Virtual Visit via Telephone Note  I connected with Luke Wong on 07/15/18 at  3:30 PM EDT by telephone and verified that I am speaking with the correct person using two identifiers.   I discussed the limitations, risks, security and privacy concerns of performing an evaluation and management service by telephone and the availability of in person appointments. I also discussed with the patient that there may be a patient responsible charge related to this service. The patient expressed understanding and agreed to proceed.   History of Present Illness:  Re UACS Onset around age 17 intermittent with uri trigger  - rx singulair 07/18/2013 > improved 08/21/13 > try off singulair 03/02/14 as pt not convinced helped - Sinus CT 08/28/13 > Sinuses are essentially clear with only minor dependent secretions noted in the inferior right frontal and left maxillary sinuses. There is anatomic narrowing of the ostiomeatal complexes, incompletely evaluated on this limited sinus study. - 02/26/2014 refer to ENT ([facility])> neg sinus dz/ pos ET dysfunction rec flonase  -03/26/14  Allergy profile:  eos 0,  IGgE 87 Dog=cat, dust, ragweed  - 04/19/14  PFTs wnl including fef 25-75 off singulair and all bronchodilators  - NO 04/14/2015 during flare = 28  - Methacholine challenge 05/05/14 > neg for asthma -CXR 05/29/18 - No active cardiopulmonary disease. rec Prednisone 10 mg take two until better then one daily x 5 days and stop  Hycodan up to 1-2 tsp every 4 hours as needed Protonix 40 mg Take 30- 60 min before your first and last meals of the day  GERD diet    07/03/18 NP acute ov  Acute sore throat, nonproductive cough, fatigue, muscle aches, and fever yesterday.  s Will order tamiflu considering symptoms and recent exposure Flu swab was negative rx  tessalon perles  May take mucinex as needed Protonix 40 mg Take 30- 60 min before your first and last meals of the day  GERD diet    07/15/2018  televisit during  [place] "lock down"  No more Sore throat no fever, but bad cough/ clear mucus/pain above eyes L > R   Dyspnea:  Ok unless cough / hears some wheeze Cough: minim mucoice Sleeping: on side  SABA use: none  02: none  gen ant cp with coughing only    No obvious day to day or daytime variability or   mucus plugs or hemoptysis or cp or chest tightness, subjective wheeze or overt sinus or hb symptoms.     Also denies any obvious fluctuation of symptoms with weather or environmental changes or other aggravating or alleviating factors except as outlined above   No unusual exposure hx or h/o childhood pna/ asthma or knowledge of premature birth.  Current Allergies, Complete Past Medical History, Past Surgical History, Family History, and Social History were reviewed in [employer] record.    ROS  The following are not active complaints unless bolded Hoarseness, sore throat, dysphagia, dental problems, itching, sneezing,  nasal congestion or discharge of excess mucus or purulent secretions, ear ache,   fever, chills, sweats, unintended wt loss or wt gain, classically pleuritic or exertional cp,  orthopnea pnd or arm/hand swelling  or leg swelling, presyncope, palpitations, abdominal pain, anorexia, nausea, vomiting, diarrhea  or change in bowel habits or change in bladder habits, change in stools or change in urine, dysuria, hematuria,  rash, arthralgias, visual complaints, headache, numbness, weakness or ataxia or problems with walking or coordination,  change in mood or  memory.             Marland Kitchen  Observations/Objective: Speaking in full sentences/ can hear harsh mostly dry sounding cough, some hoarseness /voice fatigue  Assessment and Plan:  1) URI/ ? Sinusitis with recurrent cyclical cough vs component of cough variant asthma  - Of the three most common causes of  Sub-acute / recurrent or chronic cough, only one (GERD)  can actually contribute to/ trigger  the other  two (asthma and post nasal drip syndrome)  and perpetuate the cylce of cough.  While not intuitively obvious, many patients with chronic low grade reflux do not cough until there is a primary insult that disturbs the protective epithelial barrier and exposes sensitive nerve endings.   This is typically viral but can due to PNDS and  either may apply here.   The point is that once this occurs, it is difficult to eliminate the cycle  using anything but a maximally effective acid suppression regimen at least in the short run, accompanied by an appropriate diet to address non acid GERD and control / eliminate the cough itself for at least 3 days with narcotic containing cough meds then try off   >> ok to use saba prn for now   2) ? Sinusitis / prior ENT rx as above - rx augmentin bid x 10 days then f/u with ent if not better    Follow Up Instructions:  see avs for instructions unique to this ov    I discussed the assessment and treatment plan with the patient. The patient was provided an opportunity to ask questions and all were answered. The patient agreed with the plan and demonstrated an understanding of the instructions.   The patient was advised to call back or seek an in-person evaluation if the symptoms worsen or if the condition fails to improve as anticipated.  I provided 25 minutes of non-face-to-face time during this encounter.   Sandrea Hughs, MD

## 2018-07-22 DIAGNOSIS — M5416 Radiculopathy, lumbar region: Secondary | ICD-10-CM | POA: Diagnosis not present

## 2018-07-24 DIAGNOSIS — M545 Low back pain: Secondary | ICD-10-CM | POA: Diagnosis not present

## 2018-07-24 DIAGNOSIS — M5416 Radiculopathy, lumbar region: Secondary | ICD-10-CM | POA: Diagnosis not present

## 2018-07-24 DIAGNOSIS — Z6830 Body mass index (BMI) 30.0-30.9, adult: Secondary | ICD-10-CM | POA: Diagnosis not present

## 2018-07-27 ENCOUNTER — Other Ambulatory Visit: Payer: Self-pay | Admitting: Internal Medicine

## 2018-09-02 DIAGNOSIS — M545 Low back pain: Secondary | ICD-10-CM | POA: Diagnosis not present

## 2018-09-02 DIAGNOSIS — M5416 Radiculopathy, lumbar region: Secondary | ICD-10-CM | POA: Diagnosis not present

## 2018-09-23 DIAGNOSIS — H903 Sensorineural hearing loss, bilateral: Secondary | ICD-10-CM | POA: Diagnosis not present

## 2018-09-23 DIAGNOSIS — R42 Dizziness and giddiness: Secondary | ICD-10-CM | POA: Diagnosis not present

## 2018-09-23 DIAGNOSIS — M545 Low back pain: Secondary | ICD-10-CM | POA: Diagnosis not present

## 2018-09-23 DIAGNOSIS — M5127 Other intervertebral disc displacement, lumbosacral region: Secondary | ICD-10-CM | POA: Diagnosis not present

## 2018-09-23 DIAGNOSIS — H6981 Other specified disorders of Eustachian tube, right ear: Secondary | ICD-10-CM | POA: Diagnosis not present

## 2018-10-09 DIAGNOSIS — M545 Low back pain: Secondary | ICD-10-CM | POA: Diagnosis not present

## 2018-10-09 DIAGNOSIS — Z6832 Body mass index (BMI) 32.0-32.9, adult: Secondary | ICD-10-CM | POA: Diagnosis not present

## 2018-10-22 ENCOUNTER — Other Ambulatory Visit: Payer: Self-pay

## 2018-10-22 ENCOUNTER — Other Ambulatory Visit: Payer: Self-pay | Admitting: Otolaryngology

## 2018-10-22 ENCOUNTER — Encounter (HOSPITAL_BASED_OUTPATIENT_CLINIC_OR_DEPARTMENT_OTHER): Payer: Self-pay | Admitting: *Deleted

## 2018-10-22 DIAGNOSIS — R42 Dizziness and giddiness: Secondary | ICD-10-CM | POA: Diagnosis not present

## 2018-10-22 DIAGNOSIS — H6981 Other specified disorders of Eustachian tube, right ear: Secondary | ICD-10-CM | POA: Diagnosis not present

## 2018-10-24 ENCOUNTER — Other Ambulatory Visit (HOSPITAL_COMMUNITY)
Admission: RE | Admit: 2018-10-24 | Discharge: 2018-10-24 | Disposition: A | Discharge status: Home / Self Care | Payer: BC Managed Care – PPO | Source: Ambulatory Visit | Attending: Otolaryngology | Admitting: Otolaryngology

## 2018-10-24 DIAGNOSIS — Z01812 Encounter for preprocedural laboratory examination: Secondary | ICD-10-CM | POA: Insufficient documentation

## 2018-10-24 DIAGNOSIS — Z1159 Encounter for screening for other viral diseases: Secondary | ICD-10-CM | POA: Insufficient documentation

## 2018-10-24 LAB — SARS CORONAVIRUS 2 (TAT 6-24 HRS): SARS Coronavirus 2: NEGATIVE

## 2018-10-28 ENCOUNTER — Encounter (HOSPITAL_BASED_OUTPATIENT_CLINIC_OR_DEPARTMENT_OTHER): Payer: Self-pay | Admitting: Anesthesiology

## 2018-10-28 ENCOUNTER — Ambulatory Visit (HOSPITAL_BASED_OUTPATIENT_CLINIC_OR_DEPARTMENT_OTHER): Payer: BC Managed Care – PPO | Admitting: Anesthesiology

## 2018-10-28 ENCOUNTER — Encounter (HOSPITAL_BASED_OUTPATIENT_CLINIC_OR_DEPARTMENT_OTHER)
Admission: RE | Disposition: A | Discharge status: Home / Self Care | Payer: Self-pay | Source: Ambulatory Visit | Attending: Otolaryngology

## 2018-10-28 ENCOUNTER — Ambulatory Visit (HOSPITAL_BASED_OUTPATIENT_CLINIC_OR_DEPARTMENT_OTHER)
Admission: RE | Admit: 2018-10-28 | Discharge: 2018-10-28 | Disposition: A | Discharge status: Home / Self Care | Payer: BC Managed Care – PPO | Source: Ambulatory Visit | Attending: Otolaryngology | Admitting: Otolaryngology

## 2018-10-28 DIAGNOSIS — Z6831 Body mass index (BMI) 31.0-31.9, adult: Secondary | ICD-10-CM | POA: Insufficient documentation

## 2018-10-28 DIAGNOSIS — H6981 Other specified disorders of Eustachian tube, right ear: Secondary | ICD-10-CM | POA: Insufficient documentation

## 2018-10-28 DIAGNOSIS — K219 Gastro-esophageal reflux disease without esophagitis: Secondary | ICD-10-CM | POA: Diagnosis not present

## 2018-10-28 DIAGNOSIS — R42 Dizziness and giddiness: Secondary | ICD-10-CM | POA: Diagnosis not present

## 2018-10-28 DIAGNOSIS — Z79899 Other long term (current) drug therapy: Secondary | ICD-10-CM | POA: Insufficient documentation

## 2018-10-28 DIAGNOSIS — E785 Hyperlipidemia, unspecified: Secondary | ICD-10-CM | POA: Diagnosis not present

## 2018-10-28 DIAGNOSIS — F1120 Opioid dependence, uncomplicated: Secondary | ICD-10-CM | POA: Insufficient documentation

## 2018-10-28 DIAGNOSIS — H938X1 Other specified disorders of right ear: Secondary | ICD-10-CM | POA: Insufficient documentation

## 2018-10-28 DIAGNOSIS — E669 Obesity, unspecified: Secondary | ICD-10-CM | POA: Insufficient documentation

## 2018-10-28 DIAGNOSIS — H6691 Otitis media, unspecified, right ear: Secondary | ICD-10-CM | POA: Diagnosis not present

## 2018-10-28 DIAGNOSIS — H903 Sensorineural hearing loss, bilateral: Secondary | ICD-10-CM | POA: Diagnosis not present

## 2018-10-28 DIAGNOSIS — H6521 Chronic serous otitis media, right ear: Secondary | ICD-10-CM | POA: Diagnosis not present

## 2018-10-28 DIAGNOSIS — Z7982 Long term (current) use of aspirin: Secondary | ICD-10-CM | POA: Diagnosis not present

## 2018-10-28 DIAGNOSIS — M549 Dorsalgia, unspecified: Secondary | ICD-10-CM | POA: Diagnosis not present

## 2018-10-28 DIAGNOSIS — G8929 Other chronic pain: Secondary | ICD-10-CM | POA: Diagnosis not present

## 2018-10-28 HISTORY — DX: Other chronic pain: G89.29

## 2018-10-28 HISTORY — PX: MYRINGOTOMY WITH TUBE PLACEMENT: SHX5663

## 2018-10-28 SURGERY — MYRINGOTOMY WITH TUBE PLACEMENT
Anesthesia: General | Site: Ear | Laterality: Right

## 2018-10-28 MED ORDER — SUCCINYLCHOLINE CHLORIDE 200 MG/10ML IV SOSY
PREFILLED_SYRINGE | INTRAVENOUS | Status: AC
  Filled 2018-10-28: qty 10

## 2018-10-28 MED ORDER — FENTANYL CITRATE (PF) 100 MCG/2ML IJ SOLN
50.0000 ug | INTRAMUSCULAR | Status: DC | PRN
  Administered 2018-10-28: 08:00:00 50 ug via INTRAVENOUS

## 2018-10-28 MED ORDER — MORPHINE SULFATE (PF) 4 MG/ML IV SOLN
1.0000 mg | INTRAVENOUS | Status: DC | PRN
  Administered 2018-10-28 (×2): 2 mg via INTRAVENOUS

## 2018-10-28 MED ORDER — MIDAZOLAM HCL 5 MG/5ML IJ SOLN
INTRAMUSCULAR | Status: DC | PRN
  Administered 2018-10-28: 2 mg via INTRAVENOUS

## 2018-10-28 MED ORDER — ATROPINE SULFATE 0.4 MG/ML IJ SOLN
INTRAMUSCULAR | Status: AC
  Filled 2018-10-28: qty 1

## 2018-10-28 MED ORDER — MORPHINE SULFATE (PF) 4 MG/ML IV SOLN
INTRAVENOUS | Status: AC
  Filled 2018-10-28: qty 1

## 2018-10-28 MED ORDER — LACTATED RINGERS IV SOLN
INTRAVENOUS | Status: DC
  Administered 2018-10-28: 07:00:00 via INTRAVENOUS

## 2018-10-28 MED ORDER — SCOPOLAMINE 1 MG/3DAYS TD PT72: 1.0000 | MEDICATED_PATCH | Freq: Once | TRANSDERMAL | Status: DC

## 2018-10-28 MED ORDER — ONDANSETRON HCL 4 MG/2ML IJ SOLN
INTRAMUSCULAR | Status: DC | PRN
  Administered 2018-10-28: 4 mg via INTRAVENOUS

## 2018-10-28 MED ORDER — MIDAZOLAM HCL 2 MG/2ML IJ SOLN
INTRAMUSCULAR | Status: AC
  Filled 2018-10-28: qty 2

## 2018-10-28 MED ORDER — PROPOFOL 500 MG/50ML IV EMUL
INTRAVENOUS | Status: AC
  Filled 2018-10-28: qty 50

## 2018-10-28 MED ORDER — LIDOCAINE 2% (20 MG/ML) 5 ML SYRINGE
INTRAMUSCULAR | Status: DC | PRN
  Administered 2018-10-28: 100 mg via INTRAVENOUS

## 2018-10-28 MED ORDER — CIPROFLOXACIN-FLUOCINOLONE PF 0.3-0.025 % OT SOLN
OTIC | Status: DC | PRN
  Administered 2018-10-28: .5 mL via OTIC

## 2018-10-28 MED ORDER — ONDANSETRON HCL 4 MG/2ML IJ SOLN: 4.0000 mg | Freq: Once | INTRAMUSCULAR | Status: DC | PRN

## 2018-10-28 MED ORDER — PROPOFOL 10 MG/ML IV BOLUS
INTRAVENOUS | Status: DC | PRN
  Administered 2018-10-28: 200 mg via INTRAVENOUS

## 2018-10-28 MED ORDER — FENTANYL CITRATE (PF) 100 MCG/2ML IJ SOLN
INTRAMUSCULAR | Status: AC
  Filled 2018-10-28: qty 2

## 2018-10-28 MED ORDER — MIDAZOLAM HCL 2 MG/2ML IJ SOLN: 1.0000 mg | INTRAMUSCULAR | Status: DC | PRN

## 2018-10-28 MED ORDER — DEXAMETHASONE SODIUM PHOSPHATE 4 MG/ML IJ SOLN
INTRAMUSCULAR | Status: DC | PRN
  Administered 2018-10-28: 10 mg via INTRAVENOUS

## 2018-10-28 SURGICAL SUPPLY — 11 items
BLADE MYRINGOTOMY 45DEG STRL (BLADE) ×2 IMPLANT
CANISTER SUCT 1200ML W/VALVE (MISCELLANEOUS) ×2 IMPLANT
COTTONBALL LRG STERILE PKG (GAUZE/BANDAGES/DRESSINGS) ×2 IMPLANT
GAUZE SPONGE 4X4 12PLY STRL LF (GAUZE/BANDAGES/DRESSINGS) IMPLANT
GLOVE ECLIPSE 6.5 STRL STRAW (GLOVE) ×1 IMPLANT
IV SET EXT 30 76VOL 4 MALE LL (IV SETS) ×2 IMPLANT
NS IRRIG 1000ML POUR BTL (IV SOLUTION) IMPLANT
TOWEL GREEN STERILE FF (TOWEL DISPOSABLE) ×2 IMPLANT
TUBE CONNECTING 20X1/4 (TUBING) ×2 IMPLANT
TUBE EAR SHEEHY BUTTON 1.27 (OTOLOGIC RELATED) ×2 IMPLANT
TUBE EAR T MOD 1.32X4.8 BL (OTOLOGIC RELATED) ×1 IMPLANT

## 2018-10-28 NOTE — Discharge Instructions (Signed)
°Post Anesthesia Home Care Instructions ° °Activity: °Get plenty of rest for the remainder of the day. A responsible individual must stay with you for 24 hours following the procedure.  °For the next 24 hours, DO NOT: °-Drive a car °-Operate machinery °-Drink alcoholic beverages °-Take any medication unless instructed by your physician °-Make any legal decisions or sign important papers. ° °Meals: °Start with liquid foods such as gelatin or soup. Progress to regular foods as tolerated. Avoid greasy, spicy, heavy foods. If nausea and/or vomiting occur, drink only clear liquids until the nausea and/or vomiting subsides. Call your physician if vomiting continues. ° °Special Instructions/Symptoms: °Your throat may feel dry or sore from the anesthesia or the breathing tube placed in your throat during surgery. If this causes discomfort, gargle with warm salt water. The discomfort should disappear within 24 hours. ° °If you had a scopolamine patch placed behind your ear for the management of post- operative nausea and/or vomiting: ° °1. The medication in the patch is effective for 72 hours, after which it should be removed.  Wrap patch in a tissue and discard in the trash. Wash hands thoroughly with soap and water. °2. You may remove the patch earlier than 72 hours if you experience unpleasant side effects which may include dry mouth, dizziness or visual disturbances. °3. Avoid touching the patch. Wash your hands with soap and water after contact with the patch. °  °POSTOPERATIVE INSTRUCTIONS FOR PATIENTS HAVING MYRINGOTOMY AND TUBES ° °1. Please use the ear drops in each ear with a new tube as instructed. Use the drops as prescribed by your doctor, placing the drops into the outer opening of the ear canal with the head tilted to the opposite side. Place a clean piece of cotton into the ear after using drops. A small amount of blood tinged drainage is not uncommon for several days after the tubes are inserted. °2. Nausea  and vomiting may be expected the first 6 hours after surgery. Offer liquids initially. If there is no nausea, small light meals are usually best tolerated the day of surgery. A normal diet may be resumed once nausea has passed. °3. The patient may experience mild ear discomfort the day of surgery, which is usually relieved by Tylenol. °4. A small amount of clear or blood-tinged drainage from the ears may occur a few days after surgery. If this should persists or become thick, green, yellow, or foul smelling, please contact our office at [phone]. °5. If you see clear, green, or yellow drainage from your child’s ear during colds, clean the outer ear gently with a soft, damp washcloth. Begin the prescribed ear drops (4 drops, twice a day) for one week, as previously instructed.  The drainage should stop within 48 hours after starting the ear drops. If the drainage continues or becomes yellow or green, please call our office. If your child develops a fever greater than 102 F, or has and persistent bleeding from the ear(s), please call us. °6. Try to avoid getting water in the ears. Swimming is permitted as long as there is no deep diving or swimming under water deeper than 3 feet. If you think water has gotten into the ear(s), either bathing or swimming, place 4 drops of the prescribed ear drops into the ear in question. We do recommend drops after swimming in the ocean, rivers, or lakes. °7. It is important for you to return for your scheduled appointment so that the status of the tubes can be determined.  ° °

## 2018-10-28 NOTE — Anesthesia Postprocedure Evaluation (Signed)
Anesthesia Post Note  Patient: Luke Wong  Procedure(s) Performed: RIGHT MYRINGOTOMY WITH  T TUBE PLACEMENT (Right Ear)     Patient location during evaluation: PACU Anesthesia Type: General Level of consciousness: awake and alert and oriented Pain management: pain level controlled Vital Signs Assessment: post-procedure vital signs reviewed and stable Respiratory status: spontaneous breathing, nonlabored ventilation and respiratory function stable Cardiovascular status: blood pressure returned to baseline and stable Postop Assessment: no apparent nausea or vomiting Anesthetic complications: no    Last Vitals:  Vitals:   10/28/18 0830 10/28/18 0834  BP: 134/87 121/88  Pulse: 86 83  Resp: 12 12  Temp:    SpO2: 97% 96%    Last Pain:  Vitals:   10/28/18 0817  TempSrc:   PainSc: 6                  Roizy Harold A.

## 2018-10-28 NOTE — Op Note (Signed)
DATE OF PROCEDURE:  10/28/2018                              OPERATIVE REPORT  SURGEON:  Leta Baptist, MD  PREOPERATIVE DIAGNOSES: 1. Right eustachian tube dysfunction.  POSTOPERATIVE DIAGNOSES: 1. Right eustachian tube dysfunction. 2. Right middle ear effusion  PROCEDURE PERFORMED: 1) Right myringotomy and tube placement.          ANESTHESIA:  General laryngeal mask anesthesia.  COMPLICATIONS:  None.  ESTIMATED BLOOD LOSS:  Minimal.  INDICATION FOR PROCEDURE:   Luke Wong is a 48 y.o. male with a history of chronic right eustachian tube dysfunction. The patient previously underwent right myringotomy and tube placement with improvement in his symptoms. The tube has since extruded, with recurrent symptoms. Based on the above findings, the decision was made for the patient to undergo the revision myringotomy and tube placement procedure. Likelihood of success in reducing symptoms was also discussed.  The risks, benefits, alternatives, and details of the procedure were discussed with the patient.  Questions were invited and answered.  Informed consent was obtained.  DESCRIPTION:  The patient was taken to the operating room and placed supine on the operating table.  General facemask anesthesia was administered by the anesthesiologist.  Under the operating microscope, the right ear canal was cleaned of all cerumen.  The tympanic membrane was noted to be intact but mildly retracted.  A standard myringotomy incision was made at the anterior-inferior quadrant on the tympanic membrane.  A scant amount of serous fluid was suctioned from behind the tympanic membrane. A T tube was placed, followed by antibiotic eardrops in the ear canal. The care of the patient was turned over to the anesthesiologist.  The patient was awakened from anesthesia without difficulty.  The patient was transferred to the recovery room in good condition.  OPERATIVE FINDINGS:  A scant amount of serous effusion was noted in the right  middle ear space.  SPECIMEN:  None.  FOLLOWUP CARE:  The patient will be placed on Otovel eardrops 1 vial right ear b.i.d..  The patient will follow up in my office in approximately 4 weeks.  Reymundo Winship WOOI 10/28/2018

## 2018-10-28 NOTE — H&P (Signed)
Cc: Right ear eustachian tube dysfunction  HPI: The patient is a 48 year old male who returns today complaining of recurrent dizziness and clogging sensation in his right ear. The patient has a history of eustachian tube dysfunction. He previously underwent right tube placement. The tube has since extruded. At his last visit, the TM was noted to be healed with slight asymmetric low frequency hearing loss noted. According to the patient, he continues to have clogging in his right ear with hearing loss. He complains of recurrent spinning sensation whenever he performs the Valsalva exercise. The vertigo typically lasts for several minutes. His last episode was a few days ago and lasted for 10 minutes. He currently denies any otalgia or otorrhea. He has been using Flonase daily with no relief. No other ENT, GI, or respiratory issue noted since the last visit.   Exam General: Communicates without difficulty, well nourished, no acute distress. Head: Normocephalic, no evidence injury, no tenderness, facial buttresses intact without stepoff. Eyes: PERRL, EOMI. No scleral icterus, conjunctivae clear. Neuro: CN II exam reveals vision grossly intact. No nystagmus at any point of gaze. Ears: Auricles well formed without lesions. The patient's right tympanic membrane perforation has healed. The left TM is intact. Nose: External evaluation reveals normal support and skin without lesions. Dorsum is intact. Anterior rhinoscopy reveals congested and edematous mucosa over anterior aspect of the inferior turbinates and nasal septum. No purulence is noted. Oral:  Oral cavity and oropharynx are intact, symmetric, without erythema or edema. Mucosa is moist without lesions. Neck: Full range of motion without pain. There is no significant lymphadenopathy. No masses palpable. Thyroid bed within normal limits to palpation. Parotid glands and submandibular glands equal bilaterally without mass. Trachea is midline. Neuro:  CN 2-12  grossly intact. Gait normal. Vestibular: No nystagmus at any point of gaze. Dix Hallpike negative.  Assessment 1. Chronic right eustachian tube dysfunction. The patient's right tympanic membrane perforation has healed.  2. History of bilateral high frequency sensorineural hearing loss with asymmetry noted on the right side at low frequencies.  3. Recurrent dizziness with Valsalva exercise. It is possible the patient may have a small fistula.   Plan  1. The physical exam findings are reviewed with the patient.  2. Treatment options include continued conservative management versus revision right myringotomy with T-tube placement. The risks, benefits, alternatives, and details of the procedure are extensively reviewed with the patient. Questions are invited and answered. 3. The patient would like to proceed with the right myringotomy procedure but under anesthesia. We will schedule the procedure in accordance with the family schedule.

## 2018-10-28 NOTE — Anesthesia Procedure Notes (Signed)
Procedure Name: LMA Insertion Date/Time: 10/28/2018 7:53 AM Performed by: Lieutenant Diego, CRNA Pre-anesthesia Checklist: Patient identified, Emergency Drugs available, Suction available and Patient being monitored Patient Re-evaluated:Patient Re-evaluated prior to induction Oxygen Delivery Method: Circle system utilized Preoxygenation: Pre-oxygenation with 100% oxygen Induction Type: IV induction Ventilation: Mask ventilation without difficulty LMA: LMA inserted LMA Size: 5.0 Number of attempts: 1 Placement Confirmation: positive ETCO2 and breath sounds checked- equal and bilateral Tube secured with: Tape Dental Injury: Teeth and Oropharynx as per pre-operative assessment

## 2018-10-28 NOTE — Transfer of Care (Signed)
Immediate Anesthesia Transfer of Care Note  Patient: Barbette Merino  Procedure(s) Performed: RIGHT MYRINGOTOMY WITH  T TUBE PLACEMENT (Right Ear)  Patient Location: PACU  Anesthesia Type:General  Level of Consciousness: awake and alert   Airway & Oxygen Therapy: Patient Spontanous Breathing and Patient connected to face mask oxygen  Post-op Assessment: Report given to RN and Post -op Vital signs reviewed and stable  Post vital signs: Reviewed and stable  Last Vitals:  Vitals Value Taken Time  BP 136/87 10/28/18 0802  Temp    Pulse 96 10/28/18 0803  Resp 14 10/28/18 0803  SpO2 99 % 10/28/18 0803  Vitals shown include unvalidated device data.  Last Pain:  Vitals:   10/28/18 0656  TempSrc: Oral  PainSc: 4          Complications: No apparent anesthesia complications

## 2018-10-28 NOTE — Anesthesia Preprocedure Evaluation (Addendum)
Anesthesia Evaluation  Patient identified by MRN, date of birth, ID band Patient awake    Reviewed: Allergy & Precautions, NPO status , Patient's Chart, lab work & pertinent test results  Airway Mallampati: I  TM Distance: >3 FB Neck ROM: Full    Dental no notable dental hx. (+) Teeth Intact   Pulmonary pneumonia, resolved,  Chronic Right OM   Pulmonary exam normal breath sounds clear to auscultation       Cardiovascular negative cardio ROS Normal cardiovascular exam Rhythm:Regular Rate:Normal     Neuro/Psych PSYCHIATRIC DISORDERS Depression  Neuromuscular disease    GI/Hepatic Neg liver ROS, GERD  ,  Endo/Other  Obesity Hyperlipidemia  Renal/GU negative Renal ROS  negative genitourinary   Musculoskeletal Chronic back pain- narcotic dependent   Abdominal (+) + obese,   Peds  Hematology negative hematology ROS (+)   Anesthesia Other Findings   Reproductive/Obstetrics                            Anesthesia Physical Anesthesia Plan  ASA: II  Anesthesia Plan: General   Post-op Pain Management:    Induction: Intravenous  PONV Risk Score and Plan: 3 and Treatment may vary due to age or medical condition, Ondansetron, Midazolam and Dexamethasone  Airway Management Planned: Mask and LMA  Additional Equipment:   Intra-op Plan:   Post-operative Plan:   Informed Consent: I have reviewed the patients History and Physical, chart, labs and discussed the procedure including the risks, benefits and alternatives for the proposed anesthesia with the patient or authorized representative who has indicated his/her understanding and acceptance.     Dental advisory given  Plan Discussed with: CRNA  Anesthesia Plan Comments:         Anesthesia Quick Evaluation

## 2018-10-29 ENCOUNTER — Encounter (HOSPITAL_BASED_OUTPATIENT_CLINIC_OR_DEPARTMENT_OTHER): Payer: Self-pay | Admitting: Otolaryngology

## 2018-10-31 DIAGNOSIS — Z0000 Encounter for general adult medical examination without abnormal findings: Secondary | ICD-10-CM | POA: Diagnosis not present

## 2018-10-31 DIAGNOSIS — E7800 Pure hypercholesterolemia, unspecified: Secondary | ICD-10-CM | POA: Diagnosis not present

## 2018-10-31 DIAGNOSIS — E291 Testicular hypofunction: Secondary | ICD-10-CM | POA: Diagnosis not present

## 2018-10-31 DIAGNOSIS — Z125 Encounter for screening for malignant neoplasm of prostate: Secondary | ICD-10-CM | POA: Diagnosis not present

## 2018-11-07 DIAGNOSIS — F1120 Opioid dependence, uncomplicated: Secondary | ICD-10-CM | POA: Diagnosis not present

## 2018-11-07 DIAGNOSIS — Z0000 Encounter for general adult medical examination without abnormal findings: Secondary | ICD-10-CM | POA: Diagnosis not present

## 2018-11-07 DIAGNOSIS — G4700 Insomnia, unspecified: Secondary | ICD-10-CM | POA: Insufficient documentation

## 2018-11-07 DIAGNOSIS — M545 Low back pain: Secondary | ICD-10-CM | POA: Diagnosis not present

## 2018-11-07 DIAGNOSIS — Z1331 Encounter for screening for depression: Secondary | ICD-10-CM | POA: Diagnosis not present

## 2018-11-07 DIAGNOSIS — M5416 Radiculopathy, lumbar region: Secondary | ICD-10-CM | POA: Diagnosis not present

## 2018-11-07 DIAGNOSIS — E291 Testicular hypofunction: Secondary | ICD-10-CM | POA: Diagnosis not present

## 2018-11-25 DIAGNOSIS — M545 Low back pain: Secondary | ICD-10-CM | POA: Diagnosis not present

## 2018-11-25 DIAGNOSIS — R030 Elevated blood-pressure reading, without diagnosis of hypertension: Secondary | ICD-10-CM | POA: Diagnosis not present

## 2018-11-25 DIAGNOSIS — Z6831 Body mass index (BMI) 31.0-31.9, adult: Secondary | ICD-10-CM | POA: Diagnosis not present

## 2018-11-25 DIAGNOSIS — M961 Postlaminectomy syndrome, not elsewhere classified: Secondary | ICD-10-CM | POA: Diagnosis not present

## 2018-11-26 DIAGNOSIS — H7201 Central perforation of tympanic membrane, right ear: Secondary | ICD-10-CM | POA: Diagnosis not present

## 2018-11-26 DIAGNOSIS — H6983 Other specified disorders of Eustachian tube, bilateral: Secondary | ICD-10-CM | POA: Diagnosis not present

## 2018-11-26 DIAGNOSIS — H903 Sensorineural hearing loss, bilateral: Secondary | ICD-10-CM | POA: Diagnosis not present

## 2018-12-04 DIAGNOSIS — M5416 Radiculopathy, lumbar region: Secondary | ICD-10-CM | POA: Diagnosis not present

## 2018-12-04 DIAGNOSIS — M545 Low back pain: Secondary | ICD-10-CM | POA: Diagnosis not present

## 2018-12-08 ENCOUNTER — Other Ambulatory Visit: Payer: Self-pay | Admitting: Neurological Surgery

## 2018-12-08 DIAGNOSIS — M5416 Radiculopathy, lumbar region: Secondary | ICD-10-CM

## 2018-12-25 DIAGNOSIS — M5416 Radiculopathy, lumbar region: Secondary | ICD-10-CM | POA: Diagnosis not present

## 2018-12-25 DIAGNOSIS — M961 Postlaminectomy syndrome, not elsewhere classified: Secondary | ICD-10-CM | POA: Diagnosis not present

## 2019-01-05 ENCOUNTER — Other Ambulatory Visit: Payer: BC Managed Care – PPO

## 2019-01-05 ENCOUNTER — Ambulatory Visit
Admission: RE | Admit: 2019-01-05 | Discharge: 2019-01-05 | Disposition: A | Discharge status: Home / Self Care | Payer: BC Managed Care – PPO | Source: Ambulatory Visit | Attending: Neurological Surgery | Admitting: Neurological Surgery

## 2019-01-05 DIAGNOSIS — M5416 Radiculopathy, lumbar region: Secondary | ICD-10-CM

## 2019-01-05 MED ORDER — GADOBENATE DIMEGLUMINE 529 MG/ML IV SOLN
20.0000 mL | Freq: Once | INTRAVENOUS | Status: AC | PRN
  Administered 2019-01-05: 20 mL via INTRAVENOUS

## 2019-01-15 NOTE — Progress Notes (Signed)
Cardiology Office Note   Date:  01/16/2019   ID:  Luke Wong, DOB Jul 14, 1970, MRN [id number]  PCP:  Luke Pao, MD  Cardiologist:   Luke Carnes, MD    F/U of hyperlipidemia and BP       History of Present Illness: Luke Wong is a 48 y.o. male with a history of hyperlipidemia and a very strong family history of CAD (no male lived after 71 due to heart problems)  Note that CT Ca score was 0 in pastHe has a family history of CAD  (father with CABG in 75s, MI in 42s; paternal GF and uncle also with early CAD) The patient moved to [place] in past year.  Previously lived in [place] area.  He was followed by a cardiologist there ([place])  He has had stress tests, echo, blood work there  In 2017 had Lexiscan myovue for CP   This showed small, midl fixed defect, prbable artifact.  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   SPell before that was 6 months ago   Stopped BP med since BP was OK   Outpatient Medications Prior to Visit  Medication Sig Dispense Refill  . albuterol (PROAIR HFA) 108 (90 Base) MCG/ACT inhaler 2 puffs every 4 hours as needed only  if your can't catch your breath 1 Inhaler 0  . aspirin 81 MG tablet Take 81 mg by mouth daily.    .Marland Kitchenatorvastatin (LIPITOR) 40 MG tablet Take 1 tablet (40 mg total) by mouth daily. 90 tablet 3  . cetirizine (ZYRTEC) 10 MG tablet Take 10 mg by mouth daily.    . DULoxetine (CYMBALTA) 60 MG capsule Take 60 mg by mouth daily.     . famotidine (PEPCID) 20 MG tablet Take 20 mg by mouth daily.     . fenofibrate (TRICOR) 145 MG tablet Take 1 tablet (145 mg total) by  mouth daily. 90 tablet 3  . nortriptyline (PAMELOR) 25 MG capsule Take 25 mg by mouth daily.    . Oxycodone HCl 10 MG TABS Take 10 mg by mouth as needed.    . traZODone (DESYREL) 100 MG tablet Take 100 mg by mouth daily. Take 100 mg by mouth daily as needed  5  . XTAMPZA ER 13.5 MG C12A Take 13.5 mg by mouth 2 (two) times daily.    .Marland Kitchenmorphine (MS CONTIN) 15 MG 12 hr tablet Take 15 mg by mouth 2 (two) times daily after a meal. As needed  0   No facility-administered medications prior to visit.      Allergies:   Luke Wong [meperidine]   Past Medical History:  Diagnosis Date  . Asthma   . Chronic back pain   . Depression   . GERD (gastroesophageal reflux disease)   . Hyperlipidemia   . Pneumonia 2015    Past Surgical History:  Procedure Laterality Date  . ANKLE SURGERY  Left 06/07/13, 3/16   Arthroscopic debridement  x2  . HERNIA REPAIR Bilateral 1994  . LUMBAR LAMINECTOMY/ DECOMPRESSION WITH MET-RX Right 11/30/2014   Procedure: Right L4-5 Far Lateral Microdiskectomy w/ Metrx;  Surgeon: Luke Ny Ditty, MD;  Location: [facility];  Service: Neurosurgery;  Laterality: Right;  Right L4-5 Far Lateral Microdiskectomy  . MYRINGOTOMY WITH TUBE PLACEMENT Right 10/28/2018   Procedure: RIGHT MYRINGOTOMY WITH  T TUBE PLACEMENT;  Surgeon: Luke Baptist, MD;  Location: [facility];  Service: ENT;  Laterality: Right;  . SPINAL FUSION       Social History:  The patient  reports that he has never smoked. He has never used smokeless tobacco. He reports current alcohol use. He reports that he does not use drugs.   Family History:  The patient's family history includes Heart attack in his father and paternal grandfather; Heart disease in his father; Thyroid cancer in his mother.    ROS:  Please see the history of present illness. All other systems are reviewed and  Negative to the above problem except as noted.    PHYSICAL EXAM: VS:  BP 122/80   Pulse 82   Ht 6' (1.829 m)   Wt 235  lb (106.6 kg)   SpO2 97%   BMI 31.87 kg/m     GEN:  Obese 48 yo in NAD    Neck: JVP is normal   No carotid bruits Cardiac: RRR; no murmurs, rubs, or gallops,no edema  Respiratory:  clear to auscultation bilaterally, normal work of breathing GI: soft, nontender, nondistended, + BS  No hepatomegaly  MS: no deformity Moving all extremities   Skin: warm and dry, no rash Neuro:  Strength and sensation are intact Psych: euthymic mood, full affect   EKG:  EKG is  ordered today  SR 82 bpm   rSR1 V1   Nonspecific ST changes     Lipid Panel    Component Value Date/Time   CHOL 165 01/18/2017 1658   TRIG 572 (HH) 01/18/2017 1658   HDL 33 (L) 01/18/2017 1658   CHOLHDL 5.0 01/18/2017 1658   CHOLHDL 7.0 (H) 11/25/2015 0917   VLDL 47 (H) 11/25/2015 0917   LDLCALC Comment 01/18/2017 1658   LDLDIRECT 113.0 09/16/2014 0814      Wt Readings from Last 3 Encounters:  01/16/19 235 lb (106.6 kg)  10/28/18 235 lb 3.7 oz (106.7 kg)  07/03/18 241 lb (109.3 kg)      ASSESSMENT AND PLAN:  1  CP   Atypical for cardiac unless spasm but hx does not sugg this   ? GI   Pt is very anxious which does not help   REcom: Will check CBC  Increase pepcid for a short term and follow Rx for NTG to have as needed  2  HL  Last lipids from Dr Luke Wong office  LDL excellent at 64  Trig 202  Cont current regimen   3  HTN   BP is good   FOllow   F/U next summer   Call with problems   Signed, Luke Carnes, MD  01/16/2019 9:24 AM    [address], [place], [place]  [address] Phone: [phone]; Fax: [phone]

## 2019-01-16 ENCOUNTER — Ambulatory Visit (INDEPENDENT_AMBULATORY_CARE_PROVIDER_SITE_OTHER): Payer: BC Managed Care – PPO | Admitting: Internal Medicine

## 2019-01-16 ENCOUNTER — Telehealth: Payer: Self-pay | Admitting: Internal Medicine

## 2019-01-16 ENCOUNTER — Other Ambulatory Visit: Payer: Self-pay

## 2019-01-16 ENCOUNTER — Encounter: Payer: Self-pay | Admitting: Internal Medicine

## 2019-01-16 VITALS — BP 122/80 | HR 82 | Ht 72.0 in | Wt 235.0 lb

## 2019-01-16 DIAGNOSIS — I10 Essential (primary) hypertension: Secondary | ICD-10-CM | POA: Diagnosis not present

## 2019-01-16 DIAGNOSIS — E785 Hyperlipidemia, unspecified: Secondary | ICD-10-CM | POA: Diagnosis not present

## 2019-01-16 LAB — CBC
Hematocrit: 43.5 % (ref 37.5–51.0)
Hemoglobin: 14.4 g/dL (ref 13.0–17.7)
MCH: 27.9 pg (ref 26.6–33.0)
MCHC: 33.1 g/dL (ref 31.5–35.7)
MCV: 84 fL (ref 79–97)
Platelets: 293 10*3/uL (ref 150–450)
RBC: 5.16 x10E6/uL (ref 4.14–5.80)
RDW: 12.4 % (ref 11.6–15.4)
WBC: 6.7 10*3/uL (ref 3.4–10.8)

## 2019-01-16 MED ORDER — FLOVENT HFA 110 MCG/ACT IN AERO: 1.0000 | INHALATION_SPRAY | Freq: Two times a day (BID) | RESPIRATORY_TRACT | 6 refills | Status: DC

## 2019-01-16 MED ORDER — NITROGLYCERIN 0.4 MG SL SUBL: 0.4000 mg | SUBLINGUAL_TABLET | SUBLINGUAL | 3 refills | Status: DC | PRN

## 2019-01-16 NOTE — Telephone Encounter (Signed)
Pt called to report the Flovent MDI is too expensive... $60.00... I advised him it may last him a while but he says that he cannot pay that.   I spoke with the [facility] and he says all steroid MDI's are about the same price with no generic available.   He recommended Albuterol MDI a bronchodilator only that is much less expensive or Symbicort MDI a steroidal MDI and with commercial insurance the manufacturer offers a coupon and he could get it for about 20.00.   Will forward to Dr.Ross.

## 2019-01-16 NOTE — Telephone Encounter (Signed)
COnfirm Symbicort price OK with Rx

## 2019-01-16 NOTE — Telephone Encounter (Signed)
Pt calling stating that Dr. Harrington Challenger prescribed him fluticasone and pt stated that this medication is too expensive and would like Dr. Harrington Challenger to prescribe something else affordable. Pt would like a call back concerning this matter. Please address

## 2019-01-16 NOTE — Patient Instructions (Signed)
Medication Instructions:  increase your Pepcid to 2 a day for a few days and see if your have improvement.  Start Flovent Inhaler... 2 puffs twice a day as needed  If you need a refill on your cardiac medications before your next appointment, please call your pharmacy.   Lab work: CBC today  If you have labs (blood work) drawn today and your tests are completely normal, you will receive your results only by: Marland Kitchen MyChart Message (if you have MyChart) OR . A paper copy in the mail If you have any lab test that is abnormal or we need to change your treatment, we will call you to review the results.  Testing/Procedures: None  Follow-Up: At [facility], you and your health needs are our priority.  As part of our continuing mission to provide you with exceptional heart care, we have created designated Provider Care Teams.  These Care Teams include your primary Cardiologist (physician) and Advanced Practice Providers (APPs -  Physician Assistants and Nurse Practitioners) who all work together to provide you with the care you need, when you need it. You will need a follow up appointment in:  12 months.  Please call our office 2 months in advance to schedule this appointment.  You may see Dr. Dorris Carnes or one of the following Advanced Practice Providers on your designated Care Team: Richardson Dopp, PA-C [place], [place] . Daune Perch, NP  Any Other Special Instructions Will Be Listed Below (If Applicable).

## 2019-01-19 NOTE — Telephone Encounter (Signed)
Mailbox is full.  Unable to leave VM

## 2019-01-20 DIAGNOSIS — M961 Postlaminectomy syndrome, not elsewhere classified: Secondary | ICD-10-CM | POA: Diagnosis not present

## 2019-01-30 MED ORDER — BUDESONIDE-FORMOTEROL FUMARATE 160-4.5 MCG/ACT IN AERO: 2.0000 | INHALATION_SPRAY | Freq: Every day | RESPIRATORY_TRACT | 3 refills | Status: DC

## 2019-02-23 ENCOUNTER — Other Ambulatory Visit: Payer: Self-pay | Admitting: *Deleted

## 2019-02-23 ENCOUNTER — Other Ambulatory Visit: Payer: Self-pay | Admitting: Neurological Surgery

## 2019-02-23 DIAGNOSIS — M5416 Radiculopathy, lumbar region: Secondary | ICD-10-CM | POA: Diagnosis not present

## 2019-02-23 DIAGNOSIS — I10 Essential (primary) hypertension: Secondary | ICD-10-CM | POA: Diagnosis not present

## 2019-02-23 DIAGNOSIS — Z6832 Body mass index (BMI) 32.0-32.9, adult: Secondary | ICD-10-CM | POA: Diagnosis not present

## 2019-02-23 DIAGNOSIS — M961 Postlaminectomy syndrome, not elsewhere classified: Secondary | ICD-10-CM | POA: Diagnosis not present

## 2019-02-24 ENCOUNTER — Other Ambulatory Visit: Payer: Self-pay

## 2019-02-25 ENCOUNTER — Other Ambulatory Visit: Payer: Self-pay | Admitting: *Deleted

## 2019-03-10 ENCOUNTER — Other Ambulatory Visit: Payer: Self-pay

## 2019-03-10 ENCOUNTER — Ambulatory Visit (INDEPENDENT_AMBULATORY_CARE_PROVIDER_SITE_OTHER): Payer: BC Managed Care – PPO | Admitting: Vascular Surgery

## 2019-03-10 ENCOUNTER — Encounter: Payer: Self-pay | Admitting: Vascular Surgery

## 2019-03-10 DIAGNOSIS — G8929 Other chronic pain: Secondary | ICD-10-CM | POA: Diagnosis not present

## 2019-03-10 DIAGNOSIS — M5442 Lumbago with sciatica, left side: Secondary | ICD-10-CM

## 2019-03-10 NOTE — Progress Notes (Signed)
Patient name: Luke Wong MRN: [id number] DOB: Jul 19, 1970 Sex: male  REASON FOR CONSULT: L5-S1 ALIF  HPI: Luke Wong is a 48 y.o. male, with history of hypertension and hyperlipidemia who presents for preoperative evaluation of planned L5-S1 ALIF with Dr. Ronnald Ramp.  Patient states he has chronic lower back pain with some radiation down his left leg.  He previously underwent an L4-L5 posterior fusion last year with some improvement.  He states that the issue now appears to be at L5-S1 and Dr. Ronnald Ramp plans an anterior approach.  He reports no previous abdominal surgery other than an inguinal hernia repair.  Not on any blood thinners.  Does not smoke.  He is an Forensic psychologist.  Past Medical History:  Diagnosis Date  . Asthma   . Chronic back pain   . Depression   . Essential (primary) hypertension   . GERD (gastroesophageal reflux disease)   . Hyperlipidemia   . Pneumonia 2015  . Post laminectomy syndrome     Past Surgical History:  Procedure Laterality Date  . ANKLE SURGERY Left 06/07/13, 3/16   Arthroscopic debridement  x2  . HERNIA REPAIR Bilateral 1994  . LUMBAR LAMINECTOMY/ DECOMPRESSION WITH MET-RX Right 11/30/2014   Procedure: Right L4-5 Far Lateral Microdiskectomy w/ Metrx;  Surgeon: Kevan Ny Ditty, MD;  Location: [facility];  Service: Neurosurgery;  Laterality: Right;  Right L4-5 Far Lateral Microdiskectomy  . MYRINGOTOMY WITH TUBE PLACEMENT Right 10/28/2018   Procedure: RIGHT MYRINGOTOMY WITH  T TUBE PLACEMENT;  Surgeon: Leta Baptist, MD;  Location: [facility];  Service: ENT;  Laterality: Right;  . SPINAL FUSION      Family History  Problem Relation Age of Onset  . Thyroid cancer Mother   . Heart disease Father   . Heart attack Father   . Heart attack Paternal Grandfather     SOCIAL HISTORY: Social History   Socioeconomic History  . Marital status: Married    Spouse name: Not on file  . Number of children: Not on file  . Years of education: Not on file  .  Highest education level: Not on file  Occupational History  . Occupation: Occupational hygienist  . Financial resource strain: Not on file  . Food insecurity    Worry: Not on file    Inability: Not on file  . Transportation needs    Medical: Not on file    Non-medical: Not on file  Tobacco Use  . Smoking status: Never Smoker  . Smokeless tobacco: Never Used  Substance and Sexual Activity  . Alcohol use: Yes    Comment: once per wk 0- 3 wine, beer, liquor  . Drug use: No  . Sexual activity: Not on file  Lifestyle  . Physical activity    Days per week: Not on file    Minutes per session: Not on file  . Stress: Not on file  Relationships  . Social Herbalist on phone: Not on file    Gets together: Not on file    Attends religious service: Not on file    Active member of club or organization: Not on file    Attends meetings of clubs or organizations: Not on file    Relationship status: Not on file  . Intimate partner violence    Fear of current or ex partner: Not on file    Emotionally abused: Not on file    Physically abused: Not on file    Forced  sexual activity: Not on file  Other Topics Concern  . Not on file  Social History Narrative  . Not on file    Allergies  Allergen Reactions  . Demerol [Meperidine]     unknown  . Morphine And Related Itching    Current Outpatient Medications  Medication Sig Dispense Refill  . albuterol (PROAIR HFA) 108 (90 Base) MCG/ACT inhaler 2 puffs every 4 hours as needed only  if your can't catch your breath 1 Inhaler 0  . aspirin 81 MG tablet Take 81 mg by mouth daily.    Marland Kitchen atorvastatin (LIPITOR) 40 MG tablet Take 1 tablet (40 mg total) by mouth daily. 90 tablet 3  . budesonide-formoterol (SYMBICORT) 160-4.5 MCG/ACT inhaler Inhale 2 puffs into the lungs daily. 1 Inhaler 3  . cetirizine (ZYRTEC) 10 MG tablet Take 10 mg by mouth daily.    . DULoxetine (CYMBALTA) 60 MG capsule Take 60 mg by mouth daily.     . famotidine  (PEPCID) 20 MG tablet Take 20 mg by mouth daily.     . fenofibrate (TRICOR) 145 MG tablet Take 1 tablet (145 mg total) by mouth daily. 90 tablet 3  . meloxicam (MOBIC) 7.5 MG tablet Take 7.5 mg by mouth 2 (two) times daily.    . nitroGLYCERIN (NITROSTAT) 0.4 MG SL tablet Place 1 tablet (0.4 mg total) under the tongue every 5 (five) minutes as needed for chest pain. 25 tablet 3  . nortriptyline (PAMELOR) 25 MG capsule Take 25 mg by mouth daily.    . Oxycodone HCl 10 MG TABS Take 10 mg by mouth as needed.    . traZODone (DESYREL) 100 MG tablet Take 100 mg by mouth daily. Take 100 mg by mouth daily as needed  5  . XTAMPZA ER 13.5 MG C12A Take 13.5 mg by mouth 2 (two) times daily.     No current facility-administered medications for this visit.     REVIEW OF SYSTEMS:  '[X]'  denotes positive finding, '[ ]'  denotes negative finding Cardiac  Comments:  Chest pain or chest pressure:    Shortness of breath upon exertion:    Short of breath when lying flat:    Irregular heart rhythm:        Vascular    Pain in calf, thigh, or hip brought on by ambulation:    Pain in feet at night that wakes you up from your sleep:     Blood clot in your veins:    Leg swelling:         Pulmonary    Oxygen at home:    Productive cough:     Wheezing:         Neurologic    Sudden weakness in arms or legs:     Sudden numbness in arms or legs:     Sudden onset of difficulty speaking or slurred speech:    Temporary loss of vision in one eye:     Problems with dizziness:     Back pain x   Gastrointestinal    Blood in stool:     Vomited blood:         Genitourinary    Burning when urinating:     Blood in urine:        Psychiatric    Major depression:         Hematologic    Bleeding problems:    Problems with blood clotting too easily:        Skin  Rashes or ulcers:        Constitutional    Fever or chills:      PHYSICAL EXAM: Vitals:   03/10/19 1422  BP: 118/84  Pulse: 94  Resp: 20  Temp:  (!) 97.2 F (36.2 C)  SpO2: 96%  Weight: 236 lb (107 kg)  Height: 6' (1.829 m)    GENERAL: The patient is a well-nourished male, in no acute distress. The vital signs are documented above. CARDIAC: There is a regular rate and rhythm.  VASCULAR:  2+ palpable femoral pulse bilateral 2+ palpable posterior tibial pulse bilateral PULMONARY: There is good air exchange bilaterally without wheezing or rales. ABDOMEN: Soft and non-tender with normal pitched bowel sounds.  MUSCULOSKELETAL: There are no major deformities or cyanosis. NEUROLOGIC: No focal weakness or paresthesias are detected. SKIN: There are no ulcers or rashes noted. PSYCHIATRIC: The patient has a normal affect.  DATA:   I previously reviewed his MRI and the iliac vein bifurcation appears to be at the top of L5 with the aortic bifurcation at the bottom of L4.  There is a nice plane between the left iliac vein and the vertebral body.  Assessment/Plan:  48 year old male with chronic lower back pain who presents for preop evaluation of planned L5-S1 ALIF.  I discussed the steps of surgery in detail with Mr. Gulino including transverse incision over the left rectus with full mobilization of the left rectus muscle with mobilization of the peritoneum and left ureter through retroperitoneal approach and then mobilization of the left iliac artery and vein to expose L5-S1 disc space from anterior approach.  We discussed risk and benefits including injury to all the above structures including a vessel injury.  Also discussed risk of retrograde ejaculation.  I look forward to helping Dr. Ronnald Ramp.   Marty Heck, MD [facility] Office: [phone] Pager: [phone]

## 2019-03-23 NOTE — Pre-Procedure Instructions (Signed)
[id number], [place] [place] [address] Phone: [phone] Fax: [phone]  [facility] [phone], [place] [facility] [address] [place] [address] Phone: [phone] Fax: [phone]  [facility] [place], [place] - [address] AT [facility] [address] [place] [address] Phone: [phone] Fax: [phone]    Your procedure is scheduled on Fri., Dec. 4, 2020 from 7:30AM-10:45AM  Report to [facility] Entrance "A" at 5:30AM  Call this number if you have problems the morning of surgery:  [phone]   Remember:  Do not eat or drink after midnight on Dec. 3rd    Take these medicines the morning of surgery with A SIP OF WATER: Atorvastatin (LIPITOR) DULoxetine (CYMBALTA)  Fenofibrate (TRICOR)  Fexofenadine (ALLEGRA) Oxycodone HCl  If Needed: NitroGLYCERIN (NITROSTAT) Albuterol (PROAIR HFA)- bring the day of surgery Budesonide-formoterol (SYMBICORT)- bring the day of surgery  Follow your surgeon's instructions on when to stop Aspirin.  If no instructions were given by your surgeon then you will need to call the office to get those instructions.     As of today, stop taking all Aspirin (unless instructed by your doctor) and Other Aspirin containing products, Vitamins, Fish oils, and Herbal medications. Also stop all NSAIDS i.e. Advil, Ibuprofen, Motrin, Aleve, Anaprox, Naproxen, BC, Goody Powders, and all Supplements. Including: Meloxicam (MOBIC)  No Smoking of any kind, Tobacco, or Alcohol products 24 hours prior to your procedure. If you use a Cpap at night, you may bring the machine and all equipment for your overnight stay.   Special instructions:   [place]- Preparing For Surgery  Before surgery, you can play an important role. Because skin is not sterile,  your skin needs to be as free of germs as possible. You can reduce the number of germs on your skin by washing with CHG (chlorahexidine gluconate) Soap before surgery.  CHG is an antiseptic cleaner which kills germs and bonds with the skin to continue killing germs even after washing.    Please do not use if you have an allergy to CHG or antibacterial soaps. If your skin becomes reddened/irritated stop using the CHG.  Do not shave (including legs and underarms) for at least 48 hours prior to first CHG shower. It is OK to shave your face.  Please follow these instructions carefully.   1. Shower the NIGHT BEFORE SURGERY and the MORNING OF SURGERY with CHG.   2. If you chose to wash your hair, wash your hair first as usual with your normal shampoo.  3. After you shampoo, rinse your hair and body thoroughly to remove the shampoo.  4. Use CHG as you would any other liquid soap. You can apply CHG directly to the skin and wash gently with a scrungie or a clean washcloth.   5. Apply the CHG Soap to your body ONLY FROM THE NECK DOWN.  Do not use on open wounds or open sores. Avoid contact with your eyes, ears, mouth and genitals (private parts). Wash Face and genitals (private parts)  with your normal soap.  6. Wash thoroughly, paying special attention to the area where your surgery will be performed.  7. Thoroughly rinse your body with warm water from the neck down.  8. DO NOT shower/wash with your normal soap after using and rinsing off the CHG Soap.  9. Pat yourself dry  with a CLEAN TOWEL.  10. Wear CLEAN PAJAMAS to bed the night before surgery, wear comfortable clothes the morning of surgery  11. Place CLEAN SHEETS on your bed the night of your first shower and DO NOT SLEEP WITH PETS.   Day of Surgery: Remember to brush your teeth WITH YOUR REGULAR TOOTHPASTE.  Do not wear jewelry.  Do not wear lotions, powders, colognes, or deodorant.  Do not shave 48 hours prior to surgery.  Men may  shave face and neck.  Do not bring valuables to the hospital.  [facility] is not responsible for any belongings or valuables.  Contacts, dentures or bridgework may not be worn into surgery.    For patients admitted to the hospital, discharge time will be determined by your treatment team.  Patients discharged the day of surgery will not be allowed to drive home, and someone age 49 and over needs to stay with them for 24 hours.  Please wear clean clothes to the hospital/surgery center.    Please read over the following fact sheets that you were given.

## 2019-03-24 ENCOUNTER — Other Ambulatory Visit: Payer: Self-pay

## 2019-03-24 ENCOUNTER — Other Ambulatory Visit (HOSPITAL_COMMUNITY)
Admission: RE | Admit: 2019-03-24 | Discharge: 2019-03-24 | Disposition: A | Discharge status: Home / Self Care | Payer: BC Managed Care – PPO | Source: Ambulatory Visit | Attending: Neurological Surgery | Admitting: Neurological Surgery

## 2019-03-24 ENCOUNTER — Encounter (HOSPITAL_COMMUNITY)
Admission: RE | Admit: 2019-03-24 | Discharge: 2019-03-24 | Disposition: A | Discharge status: Home / Self Care | Payer: BC Managed Care – PPO | Source: Ambulatory Visit | Attending: Neurological Surgery | Admitting: Neurological Surgery

## 2019-03-24 ENCOUNTER — Encounter (HOSPITAL_COMMUNITY): Payer: Self-pay

## 2019-03-24 DIAGNOSIS — Z01812 Encounter for preprocedural laboratory examination: Secondary | ICD-10-CM | POA: Insufficient documentation

## 2019-03-24 LAB — CBC WITH DIFFERENTIAL/PLATELET
Abs Immature Granulocytes: 0.03 10*3/uL (ref 0.00–0.07)
Basophils Absolute: 0.1 10*3/uL (ref 0.0–0.1)
Basophils Relative: 1 %
Eosinophils Absolute: 0.5 10*3/uL (ref 0.0–0.5)
Eosinophils Relative: 6 %
HCT: 46.9 % (ref 39.0–52.0)
Hemoglobin: 15.4 g/dL (ref 13.0–17.0)
Immature Granulocytes: 0 %
Lymphocytes Relative: 31 %
Lymphs Abs: 2.5 10*3/uL (ref 0.7–4.0)
MCH: 28.2 pg (ref 26.0–34.0)
MCHC: 32.8 g/dL (ref 30.0–36.0)
MCV: 85.7 fL (ref 80.0–100.0)
Monocytes Absolute: 0.7 10*3/uL (ref 0.1–1.0)
Monocytes Relative: 9 %
Neutro Abs: 4 10*3/uL (ref 1.7–7.7)
Neutrophils Relative %: 53 %
Platelets: 297 10*3/uL (ref 150–400)
RBC: 5.47 MIL/uL (ref 4.22–5.81)
RDW: 12.6 % (ref 11.5–15.5)
WBC: 7.9 10*3/uL (ref 4.0–10.5)
nRBC: 0 % (ref 0.0–0.2)

## 2019-03-24 LAB — BASIC METABOLIC PANEL
Anion gap: 16 — ABNORMAL HIGH (ref 5–15)
BUN: 13 mg/dL (ref 6–20)
CO2: 23 mmol/L (ref 22–32)
Calcium: 10 mg/dL (ref 8.9–10.3)
Chloride: 99 mmol/L (ref 98–111)
Creatinine, Ser: 1.39 mg/dL — ABNORMAL HIGH (ref 0.61–1.24)
GFR calc Af Amer: >60 mL/min (ref >60)
GFR calc non Af Amer: >60 mL/min (ref >60)
Glucose, Bld: 119 mg/dL — ABNORMAL HIGH (ref 70–99)
Potassium: 4.3 mmol/L (ref 3.5–5.1)
Sodium: 138 mmol/L (ref 135–145)

## 2019-03-24 LAB — TYPE AND SCREEN
ABO/RH(D): O POS
Antibody Screen: NEGATIVE

## 2019-03-24 LAB — PROTIME-INR
INR: 1 (ref 0.8–1.2)
Prothrombin Time: 13.1 seconds (ref 11.4–15.2)

## 2019-03-24 LAB — SURGICAL PCR SCREEN
MRSA, PCR: NEGATIVE
Staphylococcus aureus: NEGATIVE

## 2019-03-24 LAB — ABO/RH: ABO/RH(D): O POS

## 2019-03-24 NOTE — Progress Notes (Signed)
PCP - Dr. Osborne Casco  Cardiologist - Dr. Lizbeth Bark  Chest x-ray - 05/29/2018 (E)  EKG - 01/16/2019 (E)  Stress Test - 11/29/15 (E)  ECHO - Denies  Cardiac Cath - Denies  AICD-na PM-na LOOP-na  Sleep Study - Denies CPAP - None  LABS- 03/24/2019: CBC w/D, BMP, PT, T/S, PCR, COVID  ASA- LD- 11/28  ERAS- No  HA1C- Denies Fasting Blood Sugar -  Checks Blood Sugar _____ times a day  Anesthesia- No  Pt denies having chest pain, sob, or fever at this time. All instructions explained to the pt, with a verbal understanding of the material. Pt agrees to go over the instructions while at home for a better understanding. Pt also instructed to self quarantine after being tested for COVID-19. The opportunity to ask questions was provided.   Coronavirus Screening  Have you experienced the following symptoms:  Cough yes/no: No Fever (>100.19F)  yes/no: No Runny nose yes/no: No Sore throat yes/no: No Difficulty breathing/shortness of breath  yes/no: No  Have you or a family member traveled in the last 14 days and where? yes/no: No   If the patient indicates "YES" to the above questions, their PAT will be rescheduled to limit the exposure to others and, the surgeon will be notified. THE PATIENT WILL NEED TO BE ASYMPTOMATIC FOR 14 DAYS.   If the patient is not experiencing any of these symptoms, the PAT nurse will instruct them to NOT bring anyone with them to their appointment since they may have these symptoms or traveled as well.   Please remind your patients and families that hospital visitation restrictions are in effect and the importance of the restrictions.

## 2019-03-26 LAB — NOVEL CORONAVIRUS, NAA (HOSP ORDER, SEND-OUT TO REF LAB; TAT 18-24 HRS): SARS-CoV-2, NAA: NOT DETECTED

## 2019-03-26 NOTE — Anesthesia Preprocedure Evaluation (Addendum)
Anesthesia Evaluation  Patient identified by MRN, date of birth, ID band Patient awake    Reviewed: Allergy & Precautions, NPO status , Patient's Chart, lab work & pertinent test results  Airway Mallampati: III  TM Distance: >3 FB Neck ROM: Full    Dental no notable dental hx.    Pulmonary asthma (mild, controlled) ,    Pulmonary exam normal breath sounds clear to auscultation       Cardiovascular hypertension, Normal cardiovascular exam Rhythm:Regular Rate:Normal  ECG: NSR, rate 82   Neuro/Psych PSYCHIATRIC DISORDERS Depression  Neuromuscular disease    GI/Hepatic GERD  Medicated and Controlled,(+)     substance abuse  ,   Endo/Other  negative endocrine ROS  Renal/GU negative Renal ROS     Musculoskeletal  (+) narcotic dependentChronic back pain   Abdominal (+) + obese,   Peds  Hematology HLD   Anesthesia Other Findings POST LAMINECTOMY SYNDROME  Reproductive/Obstetrics                           Anesthesia Physical Anesthesia Plan  ASA: II  Anesthesia Plan: General   Post-op Pain Management:    Induction: Intravenous  PONV Risk Score and Plan: 3 and Ondansetron, Dexamethasone, Midazolam and Treatment may vary due to age or medical condition  Airway Management Planned: Oral ETT  Additional Equipment: Arterial line  Intra-op Plan:   Post-operative Plan: Extubation in OR  Informed Consent: I have reviewed the patients History and Physical, chart, labs and discussed the procedure including the risks, benefits and alternatives for the proposed anesthesia with the patient or authorized representative who has indicated his/her understanding and acceptance.     Dental advisory given  Plan Discussed with: CRNA  Anesthesia Plan Comments: (Ketamine Dexmetatomidine )       Anesthesia Quick Evaluation

## 2019-03-27 ENCOUNTER — Inpatient Hospital Stay (HOSPITAL_COMMUNITY)
Admission: RE | Admit: 2019-03-27 | Discharge: 2019-03-28 | DRG: 460 | Disposition: A | Discharge status: Home / Self Care | Payer: BC Managed Care – PPO | Attending: Neurological Surgery | Admitting: Neurological Surgery

## 2019-03-27 ENCOUNTER — Inpatient Hospital Stay (HOSPITAL_COMMUNITY): Payer: BC Managed Care – PPO | Admitting: Certified Registered Nurse Anesthetist

## 2019-03-27 ENCOUNTER — Encounter (HOSPITAL_COMMUNITY)
Admission: RE | Disposition: A | Discharge status: Home / Self Care | Payer: Self-pay | Source: Home / Self Care | Attending: Neurological Surgery

## 2019-03-27 ENCOUNTER — Inpatient Hospital Stay (HOSPITAL_COMMUNITY): Payer: BC Managed Care – PPO

## 2019-03-27 ENCOUNTER — Encounter (HOSPITAL_COMMUNITY): Payer: Self-pay | Admitting: Surgery

## 2019-03-27 ENCOUNTER — Other Ambulatory Visit: Payer: Self-pay

## 2019-03-27 DIAGNOSIS — G8929 Other chronic pain: Secondary | ICD-10-CM | POA: Diagnosis not present

## 2019-03-27 DIAGNOSIS — J4520 Mild intermittent asthma, uncomplicated: Secondary | ICD-10-CM | POA: Diagnosis not present

## 2019-03-27 DIAGNOSIS — M5127 Other intervertebral disc displacement, lumbosacral region: Principal | ICD-10-CM | POA: Diagnosis present

## 2019-03-27 DIAGNOSIS — Z981 Arthrodesis status: Secondary | ICD-10-CM

## 2019-03-27 DIAGNOSIS — Z79899 Other long term (current) drug therapy: Secondary | ICD-10-CM | POA: Diagnosis not present

## 2019-03-27 DIAGNOSIS — F329 Major depressive disorder, single episode, unspecified: Secondary | ICD-10-CM | POA: Diagnosis present

## 2019-03-27 DIAGNOSIS — I10 Essential (primary) hypertension: Secondary | ICD-10-CM | POA: Diagnosis present

## 2019-03-27 DIAGNOSIS — E785 Hyperlipidemia, unspecified: Secondary | ICD-10-CM | POA: Diagnosis not present

## 2019-03-27 DIAGNOSIS — M2578 Osteophyte, vertebrae: Secondary | ICD-10-CM | POA: Diagnosis present

## 2019-03-27 DIAGNOSIS — M4327 Fusion of spine, lumbosacral region: Secondary | ICD-10-CM | POA: Diagnosis not present

## 2019-03-27 DIAGNOSIS — Z7951 Long term (current) use of inhaled steroids: Secondary | ICD-10-CM

## 2019-03-27 DIAGNOSIS — Z6831 Body mass index (BMI) 31.0-31.9, adult: Secondary | ICD-10-CM

## 2019-03-27 DIAGNOSIS — Z7982 Long term (current) use of aspirin: Secondary | ICD-10-CM

## 2019-03-27 DIAGNOSIS — M4326 Fusion of spine, lumbar region: Secondary | ICD-10-CM | POA: Diagnosis not present

## 2019-03-27 DIAGNOSIS — Z8249 Family history of ischemic heart disease and other diseases of the circulatory system: Secondary | ICD-10-CM

## 2019-03-27 DIAGNOSIS — Z20828 Contact with and (suspected) exposure to other viral communicable diseases: Secondary | ICD-10-CM | POA: Diagnosis not present

## 2019-03-27 DIAGNOSIS — Z79891 Long term (current) use of opiate analgesic: Secondary | ICD-10-CM | POA: Diagnosis not present

## 2019-03-27 DIAGNOSIS — M5137 Other intervertebral disc degeneration, lumbosacral region: Secondary | ICD-10-CM | POA: Diagnosis not present

## 2019-03-27 DIAGNOSIS — M961 Postlaminectomy syndrome, not elsewhere classified: Secondary | ICD-10-CM | POA: Diagnosis present

## 2019-03-27 DIAGNOSIS — G9589 Other specified diseases of spinal cord: Secondary | ICD-10-CM | POA: Diagnosis not present

## 2019-03-27 DIAGNOSIS — K219 Gastro-esophageal reflux disease without esophagitis: Secondary | ICD-10-CM | POA: Diagnosis present

## 2019-03-27 DIAGNOSIS — Z885 Allergy status to narcotic agent status: Secondary | ICD-10-CM | POA: Diagnosis not present

## 2019-03-27 DIAGNOSIS — Z419 Encounter for procedure for purposes other than remedying health state, unspecified: Secondary | ICD-10-CM

## 2019-03-27 DIAGNOSIS — E669 Obesity, unspecified: Secondary | ICD-10-CM | POA: Diagnosis present

## 2019-03-27 HISTORY — PX: ANTERIOR LUMBAR FUSION: SHX1170

## 2019-03-27 HISTORY — PX: ABDOMINAL EXPOSURE: SHX5708

## 2019-03-27 SURGERY — ANTERIOR LUMBAR FUSION 1 LEVEL
Anesthesia: General | Site: Spine Lumbar

## 2019-03-27 MED ORDER — LACTATED RINGERS IV SOLN
INTRAVENOUS | Status: DC | PRN
  Administered 2019-03-27 (×2): via INTRAVENOUS

## 2019-03-27 MED ORDER — METHOCARBAMOL 500 MG PO TABS
500.0000 mg | ORAL_TABLET | Freq: Four times a day (QID) | ORAL | Status: DC | PRN
  Administered 2019-03-27 – 2019-03-28 (×3): 500 mg via ORAL
  Filled 2019-03-27 (×3): qty 1

## 2019-03-27 MED ORDER — PHENYLEPHRINE HCL-NACL 10-0.9 MG/250ML-% IV SOLN
INTRAVENOUS | Status: DC | PRN
  Administered 2019-03-27: 40 ug/min via INTRAVENOUS

## 2019-03-27 MED ORDER — FENTANYL CITRATE (PF) 250 MCG/5ML IJ SOLN
INTRAMUSCULAR | Status: DC | PRN
  Administered 2019-03-27: 50 ug via INTRAVENOUS
  Administered 2019-03-27: 200 ug via INTRAVENOUS
  Administered 2019-03-27 (×3): 50 ug via INTRAVENOUS
  Administered 2019-03-27: 100 ug via INTRAVENOUS
  Administered 2019-03-27 (×2): 50 ug via INTRAVENOUS

## 2019-03-27 MED ORDER — MIDAZOLAM HCL 2 MG/2ML IJ SOLN
INTRAMUSCULAR | Status: AC
  Filled 2019-03-27: qty 2

## 2019-03-27 MED ORDER — HYDROMORPHONE HCL 1 MG/ML IJ SOLN
INTRAMUSCULAR | Status: AC
  Filled 2019-03-27: qty 1

## 2019-03-27 MED ORDER — NORTRIPTYLINE HCL 25 MG PO CAPS
25.0000 mg | ORAL_CAPSULE | Freq: Every day | ORAL | Status: DC
  Administered 2019-03-27: 25 mg via ORAL
  Filled 2019-03-27: qty 1

## 2019-03-27 MED ORDER — OXYCODONE HCL 5 MG PO TABS
10.0000 mg | ORAL_TABLET | ORAL | Status: DC | PRN
  Administered 2019-03-27 – 2019-03-28 (×6): 10 mg via ORAL
  Filled 2019-03-27 (×6): qty 2

## 2019-03-27 MED ORDER — CEFAZOLIN SODIUM-DEXTROSE 2-4 GM/100ML-% IV SOLN
2.0000 g | Freq: Three times a day (TID) | INTRAVENOUS | Status: AC
  Administered 2019-03-27 (×2): 2 g via INTRAVENOUS
  Filled 2019-03-27 (×2): qty 100

## 2019-03-27 MED ORDER — CEFAZOLIN SODIUM-DEXTROSE 2-4 GM/100ML-% IV SOLN
2.0000 g | INTRAVENOUS | Status: AC
  Administered 2019-03-27: 08:00:00 2 g via INTRAVENOUS
  Filled 2019-03-27: qty 100

## 2019-03-27 MED ORDER — HYDROMORPHONE HCL 1 MG/ML IJ SOLN
1.0000 mg | INTRAMUSCULAR | Status: DC | PRN
  Administered 2019-03-27 (×3): 1 mg via INTRAVENOUS
  Filled 2019-03-27 (×4): qty 1

## 2019-03-27 MED ORDER — THROMBIN 20000 UNITS EX SOLR
CUTANEOUS | Status: AC
  Filled 2019-03-27: qty 20000

## 2019-03-27 MED ORDER — THROMBIN 20000 UNITS EX SOLR
CUTANEOUS | Status: DC | PRN
  Administered 2019-03-27: 20 mL via TOPICAL

## 2019-03-27 MED ORDER — THROMBIN 5000 UNITS EX SOLR
CUTANEOUS | Status: AC
  Filled 2019-03-27: qty 5000

## 2019-03-27 MED ORDER — NITROGLYCERIN 0.4 MG SL SUBL: 0.4000 mg | SUBLINGUAL_TABLET | SUBLINGUAL | Status: DC | PRN

## 2019-03-27 MED ORDER — ONDANSETRON HCL 4 MG/2ML IJ SOLN: 4.0000 mg | Freq: Four times a day (QID) | INTRAMUSCULAR | Status: DC | PRN

## 2019-03-27 MED ORDER — ACETAMINOPHEN 325 MG PO TABS
650.0000 mg | ORAL_TABLET | ORAL | Status: DC | PRN
  Administered 2019-03-27 – 2019-03-28 (×3): 650 mg via ORAL
  Filled 2019-03-27 (×3): qty 2

## 2019-03-27 MED ORDER — THROMBIN 5000 UNITS EX SOLR
OROMUCOSAL | Status: DC | PRN
  Administered 2019-03-27: 5 mL via TOPICAL

## 2019-03-27 MED ORDER — TRAZODONE HCL 100 MG PO TABS
100.0000 mg | ORAL_TABLET | Freq: Every day | ORAL | Status: DC
  Administered 2019-03-27: 100 mg via ORAL
  Filled 2019-03-27: qty 1

## 2019-03-27 MED ORDER — PHENYLEPHRINE 40 MCG/ML (10ML) SYRINGE FOR IV PUSH (FOR BLOOD PRESSURE SUPPORT)
PREFILLED_SYRINGE | INTRAVENOUS | Status: DC | PRN
  Administered 2019-03-27 (×4): 80 ug via INTRAVENOUS

## 2019-03-27 MED ORDER — 0.9 % SODIUM CHLORIDE (POUR BTL) OPTIME
TOPICAL | Status: DC | PRN
  Administered 2019-03-27: 1000 mL

## 2019-03-27 MED ORDER — ACETAMINOPHEN 500 MG PO TABS
1000.0000 mg | ORAL_TABLET | Freq: Once | ORAL | Status: AC
  Administered 2019-03-27: 1000 mg via ORAL
  Filled 2019-03-27: qty 2

## 2019-03-27 MED ORDER — DULOXETINE HCL 60 MG PO CPEP
60.0000 mg | ORAL_CAPSULE | Freq: Every day | ORAL | Status: DC
  Administered 2019-03-28: 09:00:00 60 mg via ORAL
  Filled 2019-03-27: qty 1

## 2019-03-27 MED ORDER — CHLORHEXIDINE GLUCONATE 4 % EX LIQD: 60.0000 mL | Freq: Once | CUTANEOUS | Status: DC

## 2019-03-27 MED ORDER — ROCURONIUM BROMIDE 10 MG/ML (PF) SYRINGE
PREFILLED_SYRINGE | INTRAVENOUS | Status: AC
  Filled 2019-03-27: qty 20

## 2019-03-27 MED ORDER — PROPOFOL 10 MG/ML IV BOLUS
INTRAVENOUS | Status: AC
  Filled 2019-03-27: qty 40

## 2019-03-27 MED ORDER — FENTANYL CITRATE (PF) 250 MCG/5ML IJ SOLN
INTRAMUSCULAR | Status: AC
  Filled 2019-03-27: qty 5

## 2019-03-27 MED ORDER — POTASSIUM CHLORIDE IN NACL 20-0.9 MEQ/L-% IV SOLN
INTRAVENOUS | Status: DC
  Administered 2019-03-27: 14:00:00 via INTRAVENOUS
  Filled 2019-03-27: qty 1000

## 2019-03-27 MED ORDER — CHLORHEXIDINE GLUCONATE CLOTH 2 % EX PADS: 6.0000 | MEDICATED_PAD | Freq: Once | CUTANEOUS | Status: DC

## 2019-03-27 MED ORDER — KETAMINE HCL 10 MG/ML IJ SOLN
INTRAMUSCULAR | Status: DC | PRN
  Administered 2019-03-27: 20 mg via INTRAVENOUS
  Administered 2019-03-27: 10 mg via INTRAVENOUS

## 2019-03-27 MED ORDER — SODIUM CHLORIDE 0.9% FLUSH
3.0000 mL | Freq: Two times a day (BID) | INTRAVENOUS | Status: DC
  Administered 2019-03-27 (×2): 3 mL via INTRAVENOUS

## 2019-03-27 MED ORDER — PROMETHAZINE HCL 25 MG/ML IJ SOLN: 6.2500 mg | INTRAMUSCULAR | Status: DC | PRN

## 2019-03-27 MED ORDER — PHENOL 1.4 % MT LIQD: 1.0000 | OROMUCOSAL | Status: DC | PRN

## 2019-03-27 MED ORDER — SODIUM CHLORIDE 0.9% FLUSH: 3.0000 mL | INTRAVENOUS | Status: DC | PRN

## 2019-03-27 MED ORDER — ALBUTEROL SULFATE HFA 108 (90 BASE) MCG/ACT IN AERS: 2.0000 | INHALATION_SPRAY | RESPIRATORY_TRACT | Status: DC | PRN

## 2019-03-27 MED ORDER — MIDAZOLAM HCL 2 MG/2ML IJ SOLN
INTRAMUSCULAR | Status: DC | PRN
  Administered 2019-03-27 (×2): 2 mg via INTRAVENOUS

## 2019-03-27 MED ORDER — SENNA 8.6 MG PO TABS
1.0000 | ORAL_TABLET | Freq: Two times a day (BID) | ORAL | Status: DC
  Administered 2019-03-27 – 2019-03-28 (×2): 8.6 mg via ORAL
  Filled 2019-03-27 (×2): qty 1

## 2019-03-27 MED ORDER — HYDROMORPHONE HCL 1 MG/ML IJ SOLN
0.2500 mg | INTRAMUSCULAR | Status: DC | PRN
  Administered 2019-03-27 (×4): 0.5 mg via INTRAVENOUS

## 2019-03-27 MED ORDER — SODIUM CHLORIDE 0.9 % IV SOLN
INTRAVENOUS | Status: DC | PRN
  Administered 2019-03-27: 500 mL

## 2019-03-27 MED ORDER — FENOFIBRATE 54 MG PO TABS
54.0000 mg | ORAL_TABLET | Freq: Every day | ORAL | Status: DC
  Administered 2019-03-27 – 2019-03-28 (×2): 54 mg via ORAL
  Filled 2019-03-27 (×2): qty 1

## 2019-03-27 MED ORDER — ONDANSETRON HCL 4 MG/2ML IJ SOLN
INTRAMUSCULAR | Status: DC | PRN
  Administered 2019-03-27: 4 mg via INTRAVENOUS

## 2019-03-27 MED ORDER — MOMETASONE FURO-FORMOTEROL FUM 200-5 MCG/ACT IN AERO: 2.0000 | INHALATION_SPRAY | Freq: Two times a day (BID) | RESPIRATORY_TRACT | Status: DC | PRN

## 2019-03-27 MED ORDER — ACETAMINOPHEN 650 MG RE SUPP: 650.0000 mg | RECTAL | Status: DC | PRN

## 2019-03-27 MED ORDER — ALBUMIN HUMAN 5 % IV SOLN
INTRAVENOUS | Status: DC | PRN
  Administered 2019-03-27: 10:00:00 via INTRAVENOUS

## 2019-03-27 MED ORDER — MENTHOL 3 MG MT LOZG: 1.0000 | LOZENGE | OROMUCOSAL | Status: DC | PRN

## 2019-03-27 MED ORDER — SUGAMMADEX SODIUM 200 MG/2ML IV SOLN
INTRAVENOUS | Status: DC | PRN
  Administered 2019-03-27: 400 mg via INTRAVENOUS

## 2019-03-27 MED ORDER — METHOCARBAMOL 1000 MG/10ML IJ SOLN
500.0000 mg | Freq: Four times a day (QID) | INTRAVENOUS | Status: DC | PRN
  Filled 2019-03-27: qty 5

## 2019-03-27 MED ORDER — PROPOFOL 10 MG/ML IV BOLUS
INTRAVENOUS | Status: DC | PRN
  Administered 2019-03-27: 150 mg via INTRAVENOUS

## 2019-03-27 MED ORDER — LIDOCAINE 2% (20 MG/ML) 5 ML SYRINGE
INTRAMUSCULAR | Status: AC
  Filled 2019-03-27: qty 5

## 2019-03-27 MED ORDER — ONDANSETRON HCL 4 MG PO TABS: 4.0000 mg | ORAL_TABLET | Freq: Four times a day (QID) | ORAL | Status: DC | PRN

## 2019-03-27 MED ORDER — LIDOCAINE 2% (20 MG/ML) 5 ML SYRINGE
INTRAMUSCULAR | Status: DC | PRN
  Administered 2019-03-27: 60 mg via INTRAVENOUS
  Administered 2019-03-27: 50 mg via INTRAVENOUS
  Administered 2019-03-27: 40 mg via INTRAVENOUS

## 2019-03-27 MED ORDER — PHENYLEPHRINE 40 MCG/ML (10ML) SYRINGE FOR IV PUSH (FOR BLOOD PRESSURE SUPPORT)
PREFILLED_SYRINGE | INTRAVENOUS | Status: AC
  Filled 2019-03-27: qty 10

## 2019-03-27 MED ORDER — DEXAMETHASONE SODIUM PHOSPHATE 10 MG/ML IJ SOLN
10.0000 mg | Freq: Once | INTRAMUSCULAR | Status: AC
  Administered 2019-03-27: 10 mg via INTRAVENOUS
  Filled 2019-03-27: qty 1

## 2019-03-27 MED ORDER — SODIUM CHLORIDE 0.9 % IV SOLN: 250.0000 mL | INTRAVENOUS | Status: DC

## 2019-03-27 MED ORDER — LACTATED RINGERS IV SOLN
INTRAVENOUS | Status: DC | PRN
  Administered 2019-03-27: 08:00:00 via INTRAVENOUS

## 2019-03-27 MED ORDER — ASPIRIN EC 81 MG PO TBEC
81.0000 mg | DELAYED_RELEASE_TABLET | Freq: Every day | ORAL | Status: DC
  Administered 2019-03-28: 09:00:00 81 mg via ORAL
  Filled 2019-03-27: qty 1

## 2019-03-27 MED ORDER — CELECOXIB 200 MG PO CAPS
200.0000 mg | ORAL_CAPSULE | Freq: Two times a day (BID) | ORAL | Status: DC
  Administered 2019-03-27 – 2019-03-28 (×3): 200 mg via ORAL
  Filled 2019-03-27 (×3): qty 1

## 2019-03-27 MED ORDER — KETAMINE HCL 50 MG/5ML IJ SOSY
PREFILLED_SYRINGE | INTRAMUSCULAR | Status: AC
  Filled 2019-03-27: qty 5

## 2019-03-27 MED ORDER — ROCURONIUM BROMIDE 10 MG/ML (PF) SYRINGE
PREFILLED_SYRINGE | INTRAVENOUS | Status: DC | PRN
  Administered 2019-03-27: 60 mg via INTRAVENOUS
  Administered 2019-03-27: 40 mg via INTRAVENOUS

## 2019-03-27 MED ORDER — DEXMEDETOMIDINE HCL 200 MCG/2ML IV SOLN
INTRAVENOUS | Status: DC | PRN
  Administered 2019-03-27: 12 ug via INTRAVENOUS
  Administered 2019-03-27: 20 ug via INTRAVENOUS

## 2019-03-27 SURGICAL SUPPLY — 83 items
ANCHOR LUMBAR MIS 30 (Anchor) ×9 IMPLANT
APPLIER CLIP 11 MED OPEN (CLIP) ×3
BAG DECANTER FOR FLEXI CONT (MISCELLANEOUS) ×3 IMPLANT
BONE VIVIGEN FORMABLE 5.4CC (Bone Implant) ×3 IMPLANT
BUR BARREL STRAIGHT FLUTE 4.0 (BURR) ×6 IMPLANT
BUR MATCHSTICK NEURO 3.0 LAGG (BURR) IMPLANT
CANISTER SUCT 3000ML PPV (MISCELLANEOUS) ×3 IMPLANT
CARTRIDGE OIL MAESTRO DRILL (MISCELLANEOUS) ×2 IMPLANT
CLIP APPLIE 11 MED OPEN (CLIP) ×2 IMPLANT
CLIP LIGATING EXTRA MED SLVR (CLIP) IMPLANT
CLIP LIGATING EXTRA SM BLUE (MISCELLANEOUS) IMPLANT
COVER WAND RF STERILE (DRAPES) IMPLANT
DERMABOND ADVANCED (GAUZE/BANDAGES/DRESSINGS) ×1
DERMABOND ADVANCED .7 DNX12 (GAUZE/BANDAGES/DRESSINGS) ×2 IMPLANT
DIFFUSER DRILL AIR PNEUMATIC (MISCELLANEOUS) ×3 IMPLANT
DRAPE C-ARM 42X72 X-RAY (DRAPES) ×6 IMPLANT
DRAPE C-ARMOR (DRAPES) ×3 IMPLANT
DRAPE LAPAROTOMY 100X72X124 (DRAPES) ×3 IMPLANT
DRSG OPSITE 4X5.5 SM (GAUZE/BANDAGES/DRESSINGS) ×3 IMPLANT
DRSG OPSITE POSTOP 4X8 (GAUZE/BANDAGES/DRESSINGS) ×3 IMPLANT
DURAPREP 26ML APPLICATOR (WOUND CARE) ×3 IMPLANT
ELECT BLADE 4.0 EZ CLEAN MEGAD (MISCELLANEOUS) ×3
ELECT REM PT RETURN 9FT ADLT (ELECTROSURGICAL) ×3
ELECTRODE BLDE 4.0 EZ CLN MEGD (MISCELLANEOUS) ×2 IMPLANT
ELECTRODE REM PT RTRN 9FT ADLT (ELECTROSURGICAL) ×2 IMPLANT
GAUZE 4X4 16PLY RFD (DISPOSABLE) IMPLANT
GAUZE SPONGE 4X4 12PLY STRL (GAUZE/BANDAGES/DRESSINGS) IMPLANT
GLOVE BIO SURGEON STRL SZ7.5 (GLOVE) ×3 IMPLANT
GLOVE BIO SURGEON STRL SZ8 (GLOVE) ×6 IMPLANT
GLOVE BIOGEL PI IND STRL 7.0 (GLOVE) IMPLANT
GLOVE BIOGEL PI IND STRL 8 (GLOVE) ×2 IMPLANT
GLOVE BIOGEL PI INDICATOR 7.0 (GLOVE)
GLOVE BIOGEL PI INDICATOR 8 (GLOVE) ×1
GLOVE SS BIOGEL STRL SZ 7.5 (GLOVE) ×2 IMPLANT
GLOVE SUPERSENSE BIOGEL SZ 7.5 (GLOVE) ×1
GOWN STRL REUS W/ TWL LRG LVL3 (GOWN DISPOSABLE) ×6 IMPLANT
GOWN STRL REUS W/ TWL XL LVL3 (GOWN DISPOSABLE) ×2 IMPLANT
GOWN STRL REUS W/TWL 2XL LVL3 (GOWN DISPOSABLE) ×3 IMPLANT
GOWN STRL REUS W/TWL LRG LVL3 (GOWN DISPOSABLE) ×3
GOWN STRL REUS W/TWL XL LVL3 (GOWN DISPOSABLE) ×1
HEMOSTAT POWDER KIT SURGIFOAM (HEMOSTASIS) IMPLANT
HEMOSTAT SNOW SURGICEL 2X4 (HEMOSTASIS) ×3 IMPLANT
INSERT FOGARTY 61MM (MISCELLANEOUS) IMPLANT
INSERT FOGARTY SM (MISCELLANEOUS) IMPLANT
KIT BASIN OR (CUSTOM PROCEDURE TRAY) ×3 IMPLANT
KIT TURNOVER KIT B (KITS) ×3 IMPLANT
LOOP VESSEL MAXI BLUE (MISCELLANEOUS) IMPLANT
LOOP VESSEL MINI RED (MISCELLANEOUS) IMPLANT
NEEDLE SPNL 18GX3.5 QUINCKE PK (NEEDLE) ×3 IMPLANT
NS IRRIG 1000ML POUR BTL (IV SOLUTION) ×3 IMPLANT
OIL CARTRIDGE MAESTRO DRILL (MISCELLANEOUS) ×3
PACK LAMINECTOMY NEURO (CUSTOM PROCEDURE TRAY) ×3 IMPLANT
PAD ARMBOARD 7.5X6 YLW CONV (MISCELLANEOUS) ×15 IMPLANT
SPACER HEDRON IA 29X39X13 8D (Spacer) ×3 IMPLANT
SPONGE INTESTINAL PEANUT (DISPOSABLE) ×9 IMPLANT
SPONGE LAP 18X18 RF (DISPOSABLE) ×3 IMPLANT
SPONGE LAP 4X18 RFD (DISPOSABLE) IMPLANT
SPONGE SURGIFOAM ABS GEL 100 (HEMOSTASIS) ×3 IMPLANT
STAPLER VISISTAT 35W (STAPLE) IMPLANT
SUT PDS AB 1 CTX 36 (SUTURE) ×3 IMPLANT
SUT PROLENE 4 0 RB 1 (SUTURE)
SUT PROLENE 4-0 RB1 .5 CRCL 36 (SUTURE) IMPLANT
SUT PROLENE 5 0 CC1 (SUTURE) IMPLANT
SUT PROLENE 6 0 C 1 30 (SUTURE) IMPLANT
SUT PROLENE 6 0 CC (SUTURE) IMPLANT
SUT SILK 0 TIES 10X30 (SUTURE) IMPLANT
SUT SILK 2 0 TIES 10X30 (SUTURE) IMPLANT
SUT SILK 2 0SH CR/8 30 (SUTURE) IMPLANT
SUT SILK 3 0 TIES 10X30 (SUTURE) ×3 IMPLANT
SUT SILK 3 0 TIES 17X18 (SUTURE) ×1
SUT SILK 3 0SH CR/8 30 (SUTURE) IMPLANT
SUT SILK 3-0 18XBRD TIE BLK (SUTURE) ×2 IMPLANT
SUT VIC AB 0 CT1 18XCR BRD8 (SUTURE) IMPLANT
SUT VIC AB 0 CT1 27 (SUTURE)
SUT VIC AB 0 CT1 27XBRD ANBCTR (SUTURE) IMPLANT
SUT VIC AB 0 CT1 8-18 (SUTURE)
SUT VIC AB 2-0 CP2 18 (SUTURE) ×6 IMPLANT
SUT VIC AB 3-0 SH 8-18 (SUTURE) ×6 IMPLANT
SUT VICRYL 4-0 PS2 18IN ABS (SUTURE) IMPLANT
TOWEL GREEN STERILE (TOWEL DISPOSABLE) ×3 IMPLANT
TOWEL GREEN STERILE FF (TOWEL DISPOSABLE) ×3 IMPLANT
TRAY FOLEY MTR SLVR 16FR STAT (SET/KITS/TRAYS/PACK) ×3 IMPLANT
WATER STERILE IRR 1000ML POUR (IV SOLUTION) ×3 IMPLANT

## 2019-03-27 NOTE — H&P (Signed)
Subjective: Patient is a 48 y.o. male admitted for alif. Onset of symptoms was several months ago, gradually worsening since that time.  The pain is rated severe, and is located at the across the lower back and radiates to legs. The pain is described as aching and occurs intermittently. The symptoms have been progressive. Symptoms are exacerbated by exercise. MRI or CT showed DDD with recurrent HNP L5-S1   Past Medical History:  Diagnosis Date  . Asthma   . Chronic back pain   . Depression   . Essential (primary) hypertension   . GERD (gastroesophageal reflux disease)   . Hyperlipidemia   . Pneumonia 2015  . Post laminectomy syndrome     Past Surgical History:  Procedure Laterality Date  . ANKLE SURGERY Left 06/07/13, 3/16   Arthroscopic debridement  x2  . BACK SURGERY    . HERNIA REPAIR Bilateral 1994  . LUMBAR LAMINECTOMY/ DECOMPRESSION WITH MET-RX Right 11/30/2014   Procedure: Right L4-5 Far Lateral Microdiskectomy w/ Metrx;  Surgeon: Kevan Ny Ditty, MD;  Location: [facility];  Service: Neurosurgery;  Laterality: Right;  Right L4-5 Far Lateral Microdiskectomy  . MYRINGOTOMY WITH TUBE PLACEMENT Right 10/28/2018   Procedure: RIGHT MYRINGOTOMY WITH  T TUBE PLACEMENT;  Surgeon: Leta Baptist, MD;  Location: [facility];  Service: ENT;  Laterality: Right;  . SPINAL FUSION      Prior to Admission medications   Medication Sig Start Date End Date Taking? Authorizing Provider  aspirin 81 MG tablet Take 81 mg by mouth daily.   Yes [provider]  atorvastatin (LIPITOR) 40 MG tablet Take 1 tablet (40 mg total) by mouth daily. 01/18/17  Yes Fay Records, MD  DULoxetine (CYMBALTA) 60 MG capsule Take 60 mg by mouth daily.  06/26/13  Yes [provider]  famotidine (PEPCID) 20 MG tablet Take 20 mg by mouth at bedtime.    Yes Joline Salt, RN  fenofibrate (TRICOR) 145 MG tablet Take 1 tablet (145 mg total) by mouth daily. 01/18/17  Yes Fay Records, MD   fexofenadine (ALLEGRA) 180 MG tablet Take 180 mg by mouth daily.   Yes [provider]  meloxicam (MOBIC) 7.5 MG tablet Take 7.5 mg by mouth every evening.  01/26/19  Yes [provider]  nortriptyline (PAMELOR) 25 MG capsule Take 25 mg by mouth at bedtime.  12/25/18  Yes [provider]  Oxycodone HCl 10 MG TABS Take 10 mg by mouth 3 (three) times daily.  12/26/18  Yes [provider]  traZODone (DESYREL) 100 MG tablet Take 100 mg by mouth at bedtime.  01/12/17  Yes [provider]  XTAMPZA ER 13.5 MG C12A Take 13.5 mg by mouth 2 (two) times daily. 01/05/19  Yes [provider]  albuterol (PROAIR HFA) 108 (90 Base) MCG/ACT inhaler 2 puffs every 4 hours as needed only  if your can't catch your breath Patient taking differently: Inhale 2 puffs into the lungs every 4 (four) hours as needed (wheezing/shortness of breath.).  07/15/18   Tanda Rockers, MD  budesonide-formoterol (SYMBICORT) 160-4.5 MCG/ACT inhaler Inhale 2 puffs into the lungs daily. Patient taking differently: Inhale 2 puffs into the lungs 2 (two) times daily as needed (respiratory issues.).  01/30/19   Fay Records, MD  nitroGLYCERIN (NITROSTAT) 0.4 MG SL tablet Place 1 tablet (0.4 mg total) under the tongue every 5 (five) minutes as needed for chest pain. 01/16/19   Fay Records, MD   Allergies  Allergen Reactions  . Demerol [Meperidine] Other (See Comments)    Unknown reaction type  . Morphine And Related Itching    Social History   Tobacco Use  . Smoking status: Never Smoker  . Smokeless tobacco: Never Used  Substance Use Topics  . Alcohol use: Yes    Comment: once per wk 0- 3 wine, beer, liquor    Family History  Problem Relation Age of Onset  . Thyroid cancer Mother   . Heart disease Father   . Heart attack Father   . Heart attack Paternal Grandfather      Review of Systems  Positive ROS: neg  All other systems have been reviewed and were otherwise negative with  the exception of those mentioned in the HPI and as above.  Objective: Vital signs in last 24 hours: Temp:  [98.3 F (36.8 C)] 98.3 F (36.8 C) (12/04 0602) Pulse Rate:  [104] 104 (12/04 0602) Resp:  [18] 18 (12/04 0602) BP: (150)/(93) 150/93 (12/04 0602) SpO2:  [97 %] 97 % (12/04 0602) Weight:  [104.3 kg] 104.3 kg (12/04 0602)  General Appearance: Alert, cooperative, no distress, appears stated age Head: Normocephalic, without obvious abnormality, atraumatic Eyes: PERRL, conjunctiva/corneas clear, EOM's intact    Neck: Supple, symmetrical, trachea midline Back: Symmetric, no curvature, ROM normal, no CVA tenderness Lungs:  respirations unlabored Heart: Regular rate and rhythm Abdomen: Soft, non-tender Extremities: Extremities normal, atraumatic, no cyanosis or edema Pulses: 2+ and symmetric all extremities Skin: Skin color, texture, turgor normal, no rashes or lesions  NEUROLOGIC:   Mental status: Alert and oriented x4,  no aphasia, good attention span, fund of knowledge, and memory Motor Exam - grossly normal Sensory Exam - grossly normal Reflexes: 1+ Coordination - grossly normal Gait - grossly normal Balance - grossly normal Cranial Nerves: I: smell Not tested  II: visual acuity  OS: nl    OD: nl  II: visual fields Full to confrontation  II: pupils Equal, round, reactive to light  III,VII: ptosis None  III,IV,VI: extraocular muscles  Full ROM  V: mastication Normal  V: facial light touch sensation  Normal  V,VII: corneal reflex  Present  VII: facial muscle function - upper  Normal  VII: facial muscle function - lower Normal  VIII: hearing Not tested  IX: soft palate elevation  Normal  IX,X: gag reflex Present  XI: trapezius strength  5/5  XI: sternocleidomastoid strength 5/5  XI: neck flexion strength  5/5  XII: tongue strength  Normal    Data Review Lab Results  Component Value Date   WBC 7.9 03/24/2019   HGB 15.4 03/24/2019   HCT 46.9 03/24/2019   MCV  85.7 03/24/2019   PLT 297 03/24/2019   Lab Results  Component Value Date   NA 138 03/24/2019   K 4.3 03/24/2019   CL 99 03/24/2019   CO2 23 03/24/2019   BUN 13 03/24/2019   CREATININE 1.39 (H) 03/24/2019   GLUCOSE 119 (H) 03/24/2019   Lab Results  Component Value Date   INR 1.0 03/24/2019    Assessment/Plan:  Estimated body mass index is 31.19 kg/m as calculated from the following:   Height as of this encounter: 6' (1.829 m).   Weight as of this encounter: 104.3 kg. Patient admitted for ALIF L5-S1. Patient has failed a reasonable attempt at conservative therapy.  I explained the condition and procedure to the patient and answered any questions.  Patient wishes to proceed with procedure as planned. Understands risks/ benefits and  typical outcomes of procedure.   Eustace Moore 03/27/2019 7:17 AM

## 2019-03-27 NOTE — Anesthesia Postprocedure Evaluation (Signed)
Anesthesia Post Note  Patient: Luke Wong  Procedure(s) Performed: ANTERIOR LUMBAR INTERBODY FUSION LUMBAR FIVE-SACRAL- ONE. (N/A Spine Lumbar) ABDOMINAL EXPOSURE (N/A Abdomen)     Patient location during evaluation: PACU Anesthesia Type: General Level of consciousness: awake and alert Pain management: pain level controlled Vital Signs Assessment: post-procedure vital signs reviewed and stable Respiratory status: spontaneous breathing, nonlabored ventilation, respiratory function stable and patient connected to nasal cannula oxygen Cardiovascular status: blood pressure returned to baseline and stable Postop Assessment: no apparent nausea or vomiting Anesthetic complications: no    Last Vitals:  Vitals:   03/27/19 1217 03/27/19 1239  BP:  101/67  Pulse: 85 92  Resp: 17 16  Temp:  36.6 C  SpO2: 92% 99%    Last Pain:  Vitals:   03/27/19 1115  TempSrc:   PainSc: 10-Worst pain ever                 [name]

## 2019-03-27 NOTE — H&P (Signed)
History and Physical Interval Note:  03/27/2019 7:23 AM  Luke Wong  has presented today for surgery, with the diagnosis of POST LAMINECTOMY SYNDROME.  The various methods of treatment have been discussed with the patient and family. After consideration of risks, benefits and other options for treatment, the patient has consented to  Procedure(s): ALIF - L5-S1 (N/A) ABDOMINAL EXPOSURE (N/A) as a surgical intervention.  The patient's history has been reviewed, patient examined, no change in status, stable for surgery.  I have reviewed the patient's chart and labs.  Questions were answered to the patient's satisfaction.    L5-S1 ALIF  Luke Wong  Patient name: Luke Wong       MRN: [id number]        DOB: 01-Dec-1970          Sex: male  REASON FOR CONSULT: L5-S1 ALIF  HPI: Luke Wong is a 48 y.o. male, with history of hypertension and hyperlipidemia who presents for preoperative evaluation of planned L5-S1 ALIF with Dr. Ronnald Ramp.  Patient states he has chronic lower back pain with some radiation down his left leg.  He previously underwent an L4-L5 posterior fusion last year with some improvement.  He states that the issue now appears to be at L5-S1 and Dr. Ronnald Ramp plans an anterior approach.  He reports no previous abdominal surgery other than an inguinal hernia repair.  Not on any blood thinners.  Does not smoke.  He is an Forensic psychologist.      Past Medical History:  Diagnosis Date  . Asthma   . Chronic back pain   . Depression   . Essential (primary) hypertension   . GERD (gastroesophageal reflux disease)   . Hyperlipidemia   . Pneumonia 2015  . Post laminectomy syndrome          Past Surgical History:  Procedure Laterality Date  . ANKLE SURGERY Left 06/07/13, 3/16   Arthroscopic debridement  x2  . HERNIA REPAIR Bilateral 1994  . LUMBAR LAMINECTOMY/ DECOMPRESSION WITH MET-RX Right 11/30/2014   Procedure: Right L4-5 Far Lateral Microdiskectomy w/ Metrx;  Surgeon: Kevan Ny Ditty, MD;  Location: [facility];  Service: Neurosurgery;  Laterality: Right;  Right L4-5 Far Lateral Microdiskectomy  . MYRINGOTOMY WITH TUBE PLACEMENT Right 10/28/2018   Procedure: RIGHT MYRINGOTOMY WITH  T TUBE PLACEMENT;  Surgeon: Leta Baptist, MD;  Location: [facility];  Service: ENT;  Laterality: Right;  . SPINAL FUSION           Family History  Problem Relation Age of Onset  . Thyroid cancer Mother   . Heart disease Father   . Heart attack Father   . Heart attack Paternal Grandfather     SOCIAL HISTORY: Social History        Socioeconomic History  . Marital status: Married    Spouse name: Not on file  . Number of children: Not on file  . Years of education: Not on file  . Highest education level: Not on file  Occupational History  . Occupation: Occupational hygienist  . Financial resource strain: Not on file  . Food insecurity    Worry: Not on file    Inability: Not on file  . Transportation needs    Medical: Not on file    Non-medical: Not on file  Tobacco Use  . Smoking status: Never Smoker  . Smokeless tobacco: Never Used  Substance and Sexual Activity  . Alcohol use: Yes    Comment: once  per wk 0- 3 wine, beer, liquor  . Drug use: No  . Sexual activity: Not on file  Lifestyle  . Physical activity    Days per week: Not on file    Minutes per session: Not on file  . Stress: Not on file  Relationships  . Social Herbalist on phone: Not on file    Gets together: Not on file    Attends religious service: Not on file    Active member of club or organization: Not on file    Attends meetings of clubs or organizations: Not on file    Relationship status: Not on file  . Intimate partner violence    Fear of current or ex partner: Not on file    Emotionally abused: Not on file    Physically abused: Not on file    Forced sexual activity: Not on file  Other Topics Concern  . Not on  file  Social History Narrative  . Not on file         Allergies  Allergen Reactions  . Demerol [Meperidine]     unknown  . Morphine And Related Itching          Current Outpatient Medications  Medication Sig Dispense Refill  . albuterol (PROAIR HFA) 108 (90 Base) MCG/ACT inhaler 2 puffs every 4 hours as needed only  if your can't catch your breath 1 Inhaler 0  . aspirin 81 MG tablet Take 81 mg by mouth daily.    Marland Kitchen atorvastatin (LIPITOR) 40 MG tablet Take 1 tablet (40 mg total) by mouth daily. 90 tablet 3  . budesonide-formoterol (SYMBICORT) 160-4.5 MCG/ACT inhaler Inhale 2 puffs into the lungs daily. 1 Inhaler 3  . cetirizine (ZYRTEC) 10 MG tablet Take 10 mg by mouth daily.    . DULoxetine (CYMBALTA) 60 MG capsule Take 60 mg by mouth daily.     . famotidine (PEPCID) 20 MG tablet Take 20 mg by mouth daily.     . fenofibrate (TRICOR) 145 MG tablet Take 1 tablet (145 mg total) by mouth daily. 90 tablet 3  . meloxicam (MOBIC) 7.5 MG tablet Take 7.5 mg by mouth 2 (two) times daily.    . nitroGLYCERIN (NITROSTAT) 0.4 MG SL tablet Place 1 tablet (0.4 mg total) under the tongue every 5 (five) minutes as needed for chest pain. 25 tablet 3  . nortriptyline (PAMELOR) 25 MG capsule Take 25 mg by mouth daily.    . Oxycodone HCl 10 MG TABS Take 10 mg by mouth as needed.    . traZODone (DESYREL) 100 MG tablet Take 100 mg by mouth daily. Take 100 mg by mouth daily as needed  5  . XTAMPZA ER 13.5 MG C12A Take 13.5 mg by mouth 2 (two) times daily.     No current facility-administered medications for this visit.     REVIEW OF SYSTEMS:  '[X]'  denotes positive finding, '[ ]'  denotes negative finding Cardiac  Comments:  Chest pain or chest pressure:    Shortness of breath upon exertion:    Short of breath when lying flat:    Irregular heart rhythm:        Vascular    Pain in calf, thigh, or hip brought on by ambulation:    Pain in feet at night that wakes  you up from your sleep:     Blood clot in your veins:    Leg swelling:         Pulmonary  Oxygen at home:    Productive cough:     Wheezing:         Neurologic    Sudden weakness in arms or legs:     Sudden numbness in arms or legs:     Sudden onset of difficulty speaking or slurred speech:    Temporary loss of vision in one eye:     Problems with dizziness:     Back pain x   Gastrointestinal    Blood in stool:     Vomited blood:         Genitourinary    Burning when urinating:     Blood in urine:        Psychiatric    Major depression:         Hematologic    Bleeding problems:    Problems with blood clotting too easily:        Skin    Rashes or ulcers:        Constitutional    Fever or chills:      PHYSICAL EXAM:    Vitals:   03/10/19 1422  BP: 118/84  Pulse: 94  Resp: 20  Temp: (!) 97.2 F (36.2 C)  SpO2: 96%  Weight: 236 lb (107 kg)  Height: 6' (1.829 m)    GENERAL: The patient is a well-nourished male, in no acute distress. The vital signs are documented above. CARDIAC: There is a regular rate and rhythm.  VASCULAR:  2+ palpable femoral pulse bilateral 2+ palpable posterior tibial pulse bilateral PULMONARY: There is good air exchange bilaterally without wheezing or rales. ABDOMEN: Soft and non-tender with normal pitched bowel sounds.  MUSCULOSKELETAL: There are no major deformities or cyanosis. NEUROLOGIC: No focal weakness or paresthesias are detected. SKIN: There are no ulcers or rashes noted. PSYCHIATRIC: The patient has a normal affect.  DATA:   I previously reviewed his MRI and the iliac vein bifurcation appears to be at the top of L5 with the aortic bifurcation at the bottom of L4.  There is a nice plane between the left iliac vein and the vertebral body.  Assessment/Plan:  48 year old male with chronic lower back pain who presents for preop  evaluation of planned L5-S1 ALIF.  I discussed the steps of surgery in detail with Mr. Norrod including transverse incision over the left rectus with full mobilization of the left rectus muscle with mobilization of the peritoneum and left ureter through retroperitoneal approach and then mobilization of the left iliac artery and vein to expose L5-S1 disc space from anterior approach.  We discussed risk and benefits including injury to all the above structures including a vessel injury.  Also discussed risk of retrograde ejaculation.  I look forward to helping Dr. Ronnald Ramp.   Luke Heck, MD [facility] Office: [phone] Pager: [phone]

## 2019-03-27 NOTE — Anesthesia Procedure Notes (Signed)
Procedure Name: Intubation Date/Time: 03/27/2019 7:45 AM Performed by: Valda Favia, CRNA Pre-anesthesia Checklist: Patient identified, Emergency Drugs available, Suction available, Patient being monitored and Timeout performed Patient Re-evaluated:Patient Re-evaluated prior to induction Oxygen Delivery Method: Circle system utilized Preoxygenation: Pre-oxygenation with 100% oxygen Induction Type: IV induction Ventilation: Mask ventilation without difficulty and Oral airway inserted - appropriate to patient size Laryngoscope Size: Mac and 4 Grade View: Grade II Tube type: Oral Tube size: 7.5 mm Number of attempts: 1 Airway Equipment and Method: Stylet Placement Confirmation: ETT inserted through vocal cords under direct vision,  positive ETCO2 and breath sounds checked- equal and bilateral Secured at: 23 cm Tube secured with: Tape Dental Injury: Teeth and Oropharynx as per pre-operative assessment

## 2019-03-27 NOTE — Op Note (Addendum)
Date: March 27, 2019  Preoperative diagnosis: Chronic lower back pain with degenerative disc disease  Postoperative diagnosis: Same  Procedure: Left retroperitoneal anterior spine exposure at L5-S1 disc space  Surgeon: Dr. Cephus Shelling, MD  Co-surgeon: Dr. Marikay Alar, MD  Indication: Patient is a 48 year old male who presents with gradually worsening lower back pain with radiculopathy.  Ultimately he has been evaluated by Dr. Yetta Barre and CT and MRI show degenerative disc disease at L5-S1.  He previously had posterior fusion at L3-L4 and L4-L5.  Ultimately presents today for planned anterior spine exposure at L5-S1 after risk and benefits are discussed with patient.  Findings: Transverse incision over the left rectus muscle with subsequent retroperitoneal anterior exposure of the L5-S1 disc space.  This included mobilization of the peritoneum and intestinal contents as well as the left ureter and mobilization of the left iliac artery and vein.  Middle sacral vessels were also ligated.  The L5-S1 disc space was confirmed with spinal needle on the lateral fluoroscopy.  Anesthesia: General  Details: Patient was taken to the operating room after informed consent was obtained.  Placed on operative table in supine position.  General endotracheal anesthesia was induced.  Lateral fluoroscopic C-arm was brought in and the L5-S1 disc space was identified on the abdominal wall and marked.  Ultimately the abdomen was prepped and draped in usual sterile fashion.  A preop timeout was performed identify patient, procedure and site.  Initially made a transverse incision over the identified mark at the L5-S1 disc space on the anterior abdominal wall over the left rectus.  Dissected down with Bovie cautery and opened the subcutaneous tissue until I encountered the anterior rectus fascia.  This was opened transversely with Bovie cautery.  I then used hemostats to pull up on the fascia and small flaps were  raised underneath the anterior fascia.  The left rectus muscle was then circumferentially mobilized.  I entered the retroperitoneal space lateral to the rectus muscle and ultimately the peritoneum and left ureter were all mobilized across midline until the left psoas could be identified.  That point in time Dr. Yetta Barre used Wiley retractors to pull the peritoneal contents and left ureter across midline while I continued to mobilize the L5-S1 disc space with Kd blunt dissection and suction dissection.  Ultimately the left iliac artery and vein were identified.  I placed a Balfour retractor with a wet lap pad superiorly in the wound for added visualization.  There was one medial branch off the iliac vein that was ligated with a 3-0 silk tie and clips and divided.  The middle sacral vessels were also ligated between clips and divided.  I used blunt dissection with a Kd to fully mobilized the L5-S1 disc space including mobilization of the left iliac vein lateral and superiorly.  At that point time once I had good visualization and good dissection a fixed retractor was brought on the field and a Janee Morn was placed.  I placed 150 reverse lip retractors medial and laterally as well as fixed malleable retractor superiorly inferiorly in the wound with good visualization of the L5-S1 disc base.  At that point time spinal needle was placed in the disc space and a fluoroscopic C-arm was brought in room in the lateral position to confirm her at the correct level.  The point time the case was turned over Dr. Yetta Barre.  Please see the remainder of his dictation.  Complication: None  Condition: Stable  Cephus Shelling, MD [facility] Office: [phone] Pager: [place]

## 2019-03-27 NOTE — Evaluation (Signed)
Physical Therapy Evaluation Patient Details Name: Luke Wong MRN: [id number] DOB: 08/19/1970 Today's Date: 03/27/2019   History of Present Illness  48 yo male s/p ALIF L5-S1 on 03/27/19. PMH significant for L4-L5 PLIF, laminectomy/decompression/microdiscectomy x2, post-laminectomy syndrome, GERD, HTN, HLD, myringotomy with tube placement 10/2018.  Clinical Impression   Pt presents with moderate to severe back and L flank incision pain, decreased knowledge of spinal precautions, difficulty performing log roll technique, increased time and effort to perform mobility tasks, and decreased activity tolerance. Pt to benefit from acute PT to address deficits. Pt ambulated short room distance with min guard assist and no AD, pt ambulated with RN staff prior to PT arrival. PT administered back precautions handout and reviewed, demonstrated, and practiced back precautions with pt. PT to progress mobility as tolerated, and will continue to follow acutely.      Follow Up Recommendations Follow surgeon's recommendation for DC plan and follow-up therapies;Supervision for mobility/OOB    Equipment Recommendations  None recommended by PT    Recommendations for Other Services       Precautions / Restrictions Precautions Precautions: Fall;Back Precaution Booklet Issued: Yes (comment) Precaution Comments: handout administered, PT reviewed and practiced no bending, lifting, twisting, and arching spine with pt Restrictions Weight Bearing Restrictions: No Other Position/Activity Restrictions: no brace needed per order set      Mobility  Bed Mobility Overal bed mobility: Needs Assistance Bed Mobility: Rolling;Sidelying to Sit;Sit to Sidelying Rolling: Min guard Sidelying to sit: Min guard     Sit to sidelying: Min assist General bed mobility comments: min guard for rolling and sidelying to sit for safety, verbal cuing for log roll technique and moving trunk and LEs in concert. Min assist for sit to  sidelying for LE lifting into bed.  Transfers Overall transfer level: Needs assistance Equipment used: None Transfers: Sit to/from Stand Sit to Stand: Min guard         General transfer comment: min guard for safety, increased time to rise and steady due to pain.  Ambulation/Gait Ambulation/Gait assistance: Min guard Gait Distance (Feet): 15 Feet Assistive device: None Gait Pattern/deviations: Step-through pattern;Decreased stride length Gait velocity: decr   General Gait Details: min guard for safety, distance limited by pain and pt already ambulated hallway distance with RN staff prior to PT eval.  Stairs            Wheelchair Mobility    Modified Rankin (Stroke Patients Only)       Balance Overall balance assessment: Mild deficits observed, not formally tested                                           Pertinent Vitals/Pain Pain Assessment: 0-10 Pain Score: 6  Pain Location: back, L flank from incision Pain Descriptors / Indicators: Sore;Discomfort;Grimacing Pain Intervention(s): Limited activity within patient's tolerance;Monitored during session;Premedicated before session;Repositioned;Patient requesting pain meds-RN notified;Ice applied    Home Living Family/patient expects to be discharged to:: Private residence Living Arrangements: Spouse/significant other Available Help at Discharge: Family Type of Home: House Home Access: Stairs to enter   Secretary/administrator of Steps: 2 Home Layout: Two level;Bed/bath upstairs Home Equipment: None      Prior Function Level of Independence: Independent         Comments: pt works as an Pensions consultant, 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     Hand Dominance  Dominant Hand: Right    Extremity/Trunk Assessment   Upper Extremity Assessment Upper Extremity Assessment: Overall WFL for tasks assessed    Lower Extremity Assessment Lower Extremity Assessment: Overall WFL for tasks  assessed;RLE deficits/detail RLE Deficits / Details: pt reports numbness of toes on R foot that started today, otherwise sensation WFL    Cervical / Trunk Assessment Cervical / Trunk Assessment: Normal  Communication   Communication: No difficulties  Cognition Arousal/Alertness: Awake/alert Behavior During Therapy: WFL for tasks assessed/performed Overall Cognitive Status: Within Functional Limits for tasks assessed                                 General Comments: Pt appears somewhat anxious post-operatively      General Comments      Exercises     Assessment/Plan    PT Assessment Patient needs continued PT services  PT Problem List Decreased mobility;Decreased safety awareness;Decreased knowledge of precautions;Decreased activity tolerance;Pain       PT Treatment Interventions Therapeutic activities;Gait training;Therapeutic exercise;Patient/family education;Balance training;Stair training;Functional mobility training    PT Goals (Current goals can be found in the Care Plan section)  Acute Rehab PT Goals Patient Stated Goal: stop back pain PT Goal Formulation: With patient Time For Goal Achievement: 04/03/19 Potential to Achieve Goals: Good    Frequency 7X/week   Barriers to discharge        Co-evaluation               AM-PAC PT "6 Clicks" Mobility  Outcome Measure Help needed turning from your back to your side while in a flat bed without using bedrails?: A Little Help needed moving from lying on your back to sitting on the side of a flat bed without using bedrails?: A Little Help needed moving to and from a bed to a chair (including a wheelchair)?: A Little Help needed standing up from a chair using your arms (e.g., wheelchair or bedside chair)?: None Help needed to walk in hospital room?: None Help needed climbing 3-5 steps with a railing? : A Little 6 Click Score: 20    End of Session   Activity Tolerance: Patient tolerated treatment  well;Patient limited by pain Patient left: in bed;with call bell/phone within reach;with bed alarm set;with SCD's reapplied Nurse Communication: Mobility status;Patient requests pain meds PT Visit Diagnosis: Other abnormalities of gait and mobility (R26.89);Pain Pain - Right/Left: Left Pain - part of body: (flank, back)    Time: 8786-7672 PT Time Calculation (min) (ACUTE ONLY): 20 min   Charges:   PT Evaluation $PT Eval Low Complexity: 1 Low          Greenlee Ancheta E, PT Acute Rehabilitation Services Pager [phone]  Office [phone]   Pennelope Basque D Lora Chavers 03/27/2019, 5:16 PM

## 2019-03-27 NOTE — Op Note (Signed)
03/27/2019  10:34 AM  PATIENT:  Luke Wong  48 y.o. male  PRE-OPERATIVE DIAGNOSIS:  Recurrent disc herniation L5-S1 left, nerve disc disease L5-S1, postlaminectomy syndrome, back and leg pain  POST-OPERATIVE DIAGNOSIS:  same  PROCEDURE:  1.  Anterior lumbar interbody fusion L5-S1 utilizing a porous titanium 3D printed cage packed with morselized allograft  SURGEON:  Sherley Bounds, MD  Co-surgeon: Dr. Carlis Abbott vascular surgery  ASSISTANTS: Glenford Peers, FNP  ANESTHESIA:   General  EBL: 200 ml  Total I/O In: 1610 [I.V.:1400; IV Piggyback:250] Out: 300 [Urine:100; Blood:200]  BLOOD ADMINISTERED: none  DRAINS: None  SPECIMEN:  none  INDICATION FOR PROCEDURE: This patient presented with chronic postoperative back pain with left more than right leg pain. Imaging showed recurrent disc herniation L5-S1 left with disc desiccation and loss of disc base height at L5-S1. The patient tried conservative measures without relief. Pain was debilitating. Recommended anterior lumbar interbody fusion L5-S1. Patient understood the risks, benefits, and alternatives and potential outcomes and wished to proceed.  PROCEDURE DETAILS: The patient was taken to the operating room after induction of adequate generalized endotracheal anesthesia he was placed in the supine position on the operating table.  His exposure was performed by Dr. Carlis Abbott of vascular surgery and that will be described in a separate operative report.  Once the exposure was performed and the retractor was in place, a needle was placed in the disc base in the midline was marked and our level at L5-S1 was confirmed with both AP and lateral fluoroscopy.  We then incised the disc space and did the initial discectomy with pituitary rongeurs once the disc was released from the endplates with a Cobb curette utilizing lateral fluoroscopy.  We then prepared the endplates with rasps and curettes and the use of the high-speed drill.  We drilled down to  the level of the posterior osteophytes and the posterior tonsillar ligament and the posterior disc base.  I spent considerable time with a 2 and 3 mm Kerrison punch working in the epidural space to remove the remainder of the disc herniation.  We removed a recurrent disc herniation from L5-S1 on the left.  Internal foraminotomies were performed.  We then dried the surgical bed.  We used sequential trials utilizing lateral fluoroscopy until the 13 mm 8 degree trial fit the best.  We then packed a corresponding cage with morselized allograft and utilizing AN inserter tapped this into position at L5-S1 utilizing lateral fluoroscopy.  We then tapped 1 anchor into the L5 vertebral body into 30 mm anchors into S1.  We remove the inserter and checked our final construct and locked our anchors into place.  We then irrigated with saline solution and got AP and lateral fluoroscopic images.  We got a final x-ray to make sure there were no retained objects.  We carefully remove the retractor looking for any bleeding and found none.  We then closed the rectus fascia with a running 0 Ethilon suture.  We closed the subcutaneous tissues with 2-0 Vicryl and the subcuticular tissues with 3-0 Vicryl.  The skin was closed with Dermabond.  Drapes were removed.  The patient was then awakened from general anesthesia and transferred to recovery room in stable condition.  At the end of the procedure all sponge needle instrument counts were correct   PLAN OF CARE: Admit to inpatient   PATIENT DISPOSITION:  PACU - hemodynamically stable.   Delay start of Pharmacological VTE agent (>24hrs) due to surgical blood loss or  risk of bleeding:  yes

## 2019-03-27 NOTE — Anesthesia Procedure Notes (Signed)
Arterial Line Insertion Start/End12/07/2018 6:50 AM, 03/27/2019 6:55 AM Performed by: Colin Benton, CRNA, CRNA  Patient location: Pre-op. Preanesthetic checklist: patient identified, IV checked, site marked, risks and benefits discussed, surgical consent, monitors and equipment checked, pre-op evaluation, timeout performed and anesthesia consent Lidocaine 1% used for infiltration Right, radial was placed Catheter size: 20 G Hand hygiene performed , maximum sterile barriers used  and Seldinger technique used Allen's test indicative of satisfactory collateral circulation Attempts: 1 Procedure performed without using ultrasound guided technique. Following insertion, dressing applied and Biopatch. Post procedure assessment: normal and unchanged  Patient tolerated the procedure well with no immediate complications.

## 2019-03-27 NOTE — Transfer of Care (Signed)
Immediate Anesthesia Transfer of Care Note  Patient: Luke Wong  Procedure(s) Performed: ANTERIOR LUMBAR INTERBODY FUSION LUMBAR FIVE-SACRAL- ONE. (N/A Spine Lumbar) ABDOMINAL EXPOSURE (N/A Abdomen)  Patient Location: PACU  Anesthesia Type:General  Level of Consciousness: awake, alert  and oriented  Airway & Oxygen Therapy: Patient Spontanous Breathing and Patient connected to face mask oxygen  Post-op Assessment: Report given to RN and Post -op Vital signs reviewed and stable  Post vital signs: Reviewed and stable  Last Vitals:  Vitals Value Taken Time  BP 103/62 03/27/19 1046  Temp    Pulse 85 03/27/19 1053  Resp 7 03/27/19 1053  SpO2 100 % 03/27/19 1053  Vitals shown include unvalidated device data.  Last Pain:  Vitals:   03/27/19 0615  TempSrc:   PainSc: 5       Patients Stated Pain Goal: 0 (29/51/88 4166)  Complications: No apparent anesthesia complications

## 2019-03-28 MED ORDER — OXYCODONE HCL 10 MG PO TABS: 10.0000 mg | ORAL_TABLET | ORAL | 0 refills | Status: DC | PRN

## 2019-03-28 MED ORDER — CELECOXIB 200 MG PO CAPS: 200.0000 mg | ORAL_CAPSULE | Freq: Two times a day (BID) | ORAL | 0 refills | Status: DC

## 2019-03-28 MED ORDER — METHOCARBAMOL 500 MG PO TABS: 500.0000 mg | ORAL_TABLET | Freq: Four times a day (QID) | ORAL | 1 refills | Status: AC | PRN

## 2019-03-28 MED ORDER — ACETAMINOPHEN 325 MG PO TABS: 650.0000 mg | ORAL_TABLET | ORAL | Status: DC | PRN

## 2019-03-28 NOTE — Discharge Instructions (Signed)
Wound Care Leave incision open to air. After shower You may shower. Do not scrub directly on incision.  Do not put any creams, lotions, or ointments on incision. Activity Walk each and every day, increasing distance each day. No lifting greater than 5 lbs.  Avoid bending, Lifting, and twisting. No driving for 2 weeks; may ride as a passenger locally. If provided with back brace, wear when out of bed.  It is not necessary to wear in bed. Diet Resume your normal diet.  Return to Work Will be discussed at you follow up appointment. Call Your Doctor If Any of These Occur Redness, drainage, or swelling at the wound.  Temperature greater than 101 degrees. Severe pain not relieved by pain medication. Incision starts to come apart. Follow Up Appt Call today for appointment in 3-4 weeks ([phone]) or for problems.  If you have any hardware placed in your spine, you will need an x-ray before your appointment.

## 2019-03-28 NOTE — Progress Notes (Signed)
Physical Therapy Treatment Patient Details Name: Luke Wong MRN: [id number] DOB: 1971-04-19 Today's Date: 03/28/2019    History of Present Illness Pt is a 48 yo male s/p ALIF L5-S1 on 03/27/19. PMH significant for L4-L5 PLIF, laminectomy/decompression/microdiscectomy x2, post-laminectomy syndrome, GERD, HTN, HLD, myringotomy with tube placement 10/2018.    PT Comments    Pt making steady progress with functional mobility. He tolerated stair training well this session. PT provided pt education re: back precautions, car transfers and generalized walking program for pt to initiate upon d/c home. PT will continue to follow acutely.    Follow Up Recommendations  No PT follow up     Equipment Recommendations  None recommended by PT    Recommendations for Other Services       Precautions / Restrictions Precautions Precautions: Fall;Back Precaution Comments: reviewed 3/3 back precautions with pt throughout including log roll technique Restrictions Weight Bearing Restrictions: No    Mobility  Bed Mobility Overal bed mobility: Needs Assistance Bed Mobility: Rolling;Sidelying to Sit Rolling: Supervision Sidelying to sit: Supervision       General bed mobility comments: supervision for safety; good technique utilized  Transfers Overall transfer level: Needs assistance Equipment used: None Transfers: Sit to/from Stand Sit to Stand: Supervision         General transfer comment: no instability with transition  Ambulation/Gait Ambulation/Gait assistance: Supervision Gait Distance (Feet): 500 Feet Assistive device: None Gait Pattern/deviations: Step-through pattern;Decreased stride length Gait velocity: decreased   General Gait Details: supervision for safety; no instability or LOB, no need for physical assistance or UE supports   Stairs Stairs: Yes Stairs assistance: Supervision Stair Management: One rail Right;Alternating pattern;Forwards Number of Stairs:  10 General stair comments: no instability or LOB, supervision for safety   Wheelchair Mobility    Modified Rankin (Stroke Patients Only)       Balance Overall balance assessment: No apparent balance deficits (not formally assessed)                                          Cognition Arousal/Alertness: Awake/alert Behavior During Therapy: WFL for tasks assessed/performed;Flat affect Overall Cognitive Status: Within Functional Limits for tasks assessed                                        Exercises      General Comments        Pertinent Vitals/Pain Pain Assessment: 0-10 Pain Score: 7  Pain Location: incision site Pain Descriptors / Indicators: Sore;Discomfort;Grimacing Pain Intervention(s): Monitored during session;Repositioned    Home Living                      Prior Function            PT Goals (current goals can now be found in the care plan section) Acute Rehab PT Goals PT Goal Formulation: With patient Time For Goal Achievement: 04/03/19 Potential to Achieve Goals: Good Progress towards PT goals: Progressing toward goals    Frequency    7X/week      PT Plan Current plan remains appropriate    Co-evaluation              AM-PAC PT "6 Clicks" Mobility   Outcome Measure  Help needed turning from your back to your  side while in a flat bed without using bedrails?: None Help needed moving from lying on your back to sitting on the side of a flat bed without using bedrails?: None Help needed moving to and from a bed to a chair (including a wheelchair)?: None Help needed standing up from a chair using your arms (e.g., wheelchair or bedside chair)?: None Help needed to walk in hospital room?: None Help needed climbing 3-5 steps with a railing? : None 6 Click Score: 24    End of Session   Activity Tolerance: Patient tolerated treatment well Patient left: in bed;with call bell/phone within reach;Other  (comment)(seated EOB) Nurse Communication: Mobility status PT Visit Diagnosis: Other abnormalities of gait and mobility (R26.89);Pain Pain - part of body: (back)     Time: 2130-8657 PT Time Calculation (min) (ACUTE ONLY): 11 min  Charges:  $Gait Training: 8-22 mins                     Arletta Bale, DPT  Acute Rehabilitation Services Pager [phone] Office [phone]     Luke Wong 03/28/2019, 9:32 AM

## 2019-03-28 NOTE — Discharge Summary (Signed)
Physician Discharge Summary     Providing Compassionate, Quality Care - Together    Patient ID: Luke Wong MRN: [id number] DOB/AGE: [birth year] 46 y.o.  Admit date: 03/27/2019 Discharge date: 03/28/2019  Admission Diagnoses: Recurrent disc herniation L5-S1 left, nerve disc disease L5-S1, postlaminectomy syndrome, back and leg pain  Discharge Diagnoses:  Active Problems:   S/P lumbar fusion   Discharged Condition: good  Hospital Course: Patient was admitted following L5-S1 ALIF by Dr. Ronnald Ramp on 03/27/2019. He is doing well post-operatively. He has ambulated with therapies. His pain is reasonably controlled. He is ready for discharge home.  Consults: rehabilitation medicine  Significant Diagnostic Studies: radiology: Dg Lumbar Spine 2-3 Views  Result Date: 03/27/2019 CLINICAL DATA:  L5-S1 ALIF. EXAM: LUMBAR SPINE - 2-3 VIEW; DG C-ARM 1-60 MIN COMPARISON:  MRI of lumbar spine 01/05/2019. FINDINGS: Two intraoperative views demonstrate anterior disc spacer in place. Anchors are noted within the L5 and S1 vertebral bodies. Alignment is anatomic. L4-5 construct is stable. IMPRESSION: Anterior disc spacer placement at L5-S1 without radiographic evidence for complication. Electronically Signed   By: San Morelle M.D.   On: 03/27/2019 10:55   Dg C-arm 1-60 Min  Result Date: 03/27/2019 CLINICAL DATA:  L5-S1 ALIF. EXAM: LUMBAR SPINE - 2-3 VIEW; DG C-ARM 1-60 MIN COMPARISON:  MRI of lumbar spine 01/05/2019. FINDINGS: Two intraoperative views demonstrate anterior disc spacer in place. Anchors are noted within the L5 and S1 vertebral bodies. Alignment is anatomic. L4-5 construct is stable. IMPRESSION: Anterior disc spacer placement at L5-S1 without radiographic evidence for complication. Electronically Signed   By: San Morelle M.D.   On: 03/27/2019 10:55   Dg Or Local Abdomen  Result Date: 03/27/2019 CLINICAL DATA:  Status post surgical fusion of L5-S1. EXAM: OR LOCAL ABDOMEN  COMPARISON:  None. FINDINGS: Postsurgical changes are seen involving the lower lumbar spine. Surgical clips are seen projected over the sacrum. No other radiopaque foreign body is noted. IMPRESSION: Postsurgical changes are seen involving the lower lumbar spine. No other radiopaque foreign body is noted. These results were called by telephone at the time of interpretation on 03/27/2019 at 10:22 am to provider Dory Day, who verbally acknowledged these results. Electronically Signed   By: Marijo Conception M.D.   On: 03/27/2019 10:23     Treatments: surgery: Anterior lumbar interbody fusion L5-S1 utilizing a porous titanium 3D printed cage packed with morselized allograft  Discharge Exam: Blood pressure 115/79, pulse 81, temperature 98.2 F (36.8 C), temperature source Oral, resp. rate 18, height 6' (1.829 m), weight 104.3 kg, SpO2 100 %.   Alert and oriented x 4 PERRLA CN II-XII grossly intact MAE, Strength and sensation intact Incision is covered with Honeycomb dressing and Steri Strips; Dressing is clean, dry, and intact   Disposition:    Allergies as of 03/28/2019      Reactions   Demerol [meperidine] Other (See Comments)   Unknown reaction type   Morphine And Related Itching      Medication List    STOP taking these medications   meloxicam 7.5 MG tablet Commonly known as: MOBIC     TAKE these medications   acetaminophen 325 MG tablet Commonly known as: TYLENOL Take 2 tablets (650 mg total) by mouth every 4 (four) hours as needed for mild pain ((score 1 to 3) or temp > 100.5).   albuterol 108 (90 Base) MCG/ACT inhaler Commonly known as: ProAir HFA 2 puffs every 4 hours as needed only  if your can't catch your  breath What changed:   how much to take  how to take this  when to take this  reasons to take this  additional instructions   aspirin 81 MG tablet Take 81 mg by mouth daily.   atorvastatin 40 MG tablet Commonly known as: Lipitor Take 1 tablet (40 mg  total) by mouth daily.   budesonide-formoterol 160-4.5 MCG/ACT inhaler Commonly known as: Symbicort Inhale 2 puffs into the lungs daily. What changed:   when to take this  reasons to take this   celecoxib 200 MG capsule Commonly known as: CELEBREX Take 1 capsule (200 mg total) by mouth every 12 (twelve) hours.   DULoxetine 60 MG capsule Commonly known as: CYMBALTA Take 60 mg by mouth daily.   famotidine 20 MG tablet Commonly known as: PEPCID Take 20 mg by mouth at bedtime.   fenofibrate 145 MG tablet Commonly known as: TRICOR Take 1 tablet (145 mg total) by mouth daily.   fexofenadine 180 MG tablet Commonly known as: ALLEGRA Take 180 mg by mouth daily.   methocarbamol 500 MG tablet Commonly known as: ROBAXIN Take 1 tablet (500 mg total) by mouth every 6 (six) hours as needed for muscle spasms.   nitroGLYCERIN 0.4 MG SL tablet Commonly known as: NITROSTAT Place 1 tablet (0.4 mg total) under the tongue every 5 (five) minutes as needed for chest pain.   nortriptyline 25 MG capsule Commonly known as: PAMELOR Take 25 mg by mouth at bedtime.   Oxycodone HCl 10 MG Tabs Take 1 tablet (10 mg total) by mouth every 4 (four) hours as needed for severe pain ((score 7 to 10)). What changed:   when to take this  reasons to take this   traZODone 100 MG tablet Commonly known as: DESYREL Take 100 mg by mouth at bedtime.   Xtampza ER 13.5 MG C12a Generic drug: oxyCODONE ER Take 13.5 mg by mouth 2 (two) times daily.            Durable Medical Equipment  (From admission, onward)         Start     Ordered   03/27/19 1239  DME Walker rolling  Once    Question:  Patient needs a walker to treat with the following condition  Answer:  S/P lumbar fusion   03/27/19 1238   03/27/19 1239  DME 3 n 1  Once     03/27/19 1238         Follow-up Information    Tia Alert, MD. Go in 2 week(s).   Specialty: Neurosurgery Contact information: [address] [place] [address] [phone]           Signed: Floreen Comber 03/28/2019, 10:03 AM

## 2019-03-28 NOTE — Plan of Care (Signed)
Patient alert and oriented, mae's well, voiding adequate amount of urine, swallowing without difficulty, no c/o pain at time of discharge. Patient discharged home with family. Script and discharged instructions given to patient. Patient and family stated understanding of instructions given. Patient has an appointment with Dr. Jones °

## 2019-03-28 NOTE — Progress Notes (Signed)
   VASCULAR SURGERY ASSESSMENT & PLAN:   POD 1 -ANTERIOR RETROPERITONEAL EXPOSURE L5-S1: Doing well.  Vascular surgery will be available as needed.  SUBJECTIVE:   Pain well controlled.  PHYSICAL EXAM:   Vitals:   03/27/19 2347 03/28/19 0006 03/28/19 0338 03/28/19 0340  BP: 120/79  118/69   Pulse: 90  87   Resp: 18  20   Temp:  98.4 F (36.9 C)  98.5 F (36.9 C)  TempSrc:  Oral  Oral  SpO2: 99%  93%   Weight:      Height:       Palpable left posterior tibial pulse.  LABS:   Lab Results  Component Value Date   WBC 7.9 03/24/2019   HGB 15.4 03/24/2019   HCT 46.9 03/24/2019   MCV 85.7 03/24/2019   PLT 297 03/24/2019   Lab Results  Component Value Date   CREATININE 1.39 (H) 03/24/2019   Lab Results  Component Value Date   INR 1.0 03/24/2019   CBG (last 3)  No results for input(s): GLUCAP in the last 72 hours.  PROBLEM LIST:    Active Problems:   S/P lumbar fusion   CURRENT MEDS:   . aspirin EC  81 mg Oral Daily  . celecoxib  200 mg Oral Q12H  . DULoxetine  60 mg Oral Daily  . fenofibrate  54 mg Oral Daily  . nortriptyline  25 mg Oral QHS  . senna  1 tablet Oral BID  . sodium chloride flush  3 mL Intravenous Q12H  . traZODone  100 mg Oral QHS    Deitra Mayo Office: [phone] 03/28/2019

## 2019-03-28 NOTE — Evaluation (Signed)
Occupational Therapy Evaluation and Discharge Patient Details Name: Luke Wong MRN: [id number] DOB: Jan 24, 1971 Today's Date: 03/28/2019    History of Present Illness Pt is a 47 yo male s/p ALIF L5-S1 on 03/27/19. PMH significant for L4-L5 PLIF, laminectomy/decompression/microdiscectomy x2, post-laminectomy syndrome, GERD, HTN, HLD, myringotomy with tube placement 10/2018.   Clinical Impression   All education completed with pt verbalizing understanding. No further OT needs.    Follow Up Recommendations  No OT follow up    Equipment Recommendations  None recommended by OT    Recommendations for Other Services       Precautions / Restrictions Precautions Precautions: Fall;Back Precaution Booklet Issued: Yes (comment) Precaution Comments: reviewed 3/3 back precautions related to ADL Restrictions Weight Bearing Restrictions: No Other Position/Activity Restrictions: no brace needed per order set      Mobility Bed Mobility Overal bed mobility: Needs Assistance Bed Mobility: Rolling;Sidelying to Sit Rolling: Supervision Sidelying to sit: Supervision       General bed mobility comments: supervision for safety; good technique utilized  Transfers Overall transfer level: Needs assistance Equipment used: None Transfers: Sit to/from Stand Sit to Stand: Supervision         General transfer comment: no instability with transition    Balance Overall balance assessment: No apparent balance deficits (not formally assessed)                                         ADL either performed or assessed with clinical judgement   ADL Overall ADL's : Needs assistance/impaired Eating/Feeding: Independent     Grooming Details (indicate cue type and reason): educated in two cup method for toothbrushing and to use washcloth for face   Upper Body Bathing Details (indicate cue type and reason): recommended long handled bath sponge for back   Lower Body Bathing Details  (indicate cue type and reason): recommended long handled bath sponge for reaching feet and use of reacher to dry them       Lower Body Dressing Details (indicate cue type and reason): educated in use of reacher to start pants over feet and sock aide        Toileting - Clothing Manipulation Details (indicate cue type and reason): educated to avoid twisting with pericare, use of tongs as needed     Functional mobility during ADLs: Supervision/safety General ADL Comments: Instructed in IADL to avoid.     Vision Patient Visual Report: No change from baseline       Perception     Praxis      Pertinent Vitals/Pain Pain Assessment: Faces Pain Score: 7  Faces Pain Scale: Hurts even more Pain Location: incision site Pain Descriptors / Indicators: Sore;Discomfort;Grimacing Pain Intervention(s): Patient requesting pain meds-RN notified;RN gave pain meds during session     Hand Dominance Right   Extremity/Trunk Assessment Upper Extremity Assessment Upper Extremity Assessment: Overall WFL for tasks assessed   Lower Extremity Assessment Lower Extremity Assessment: Defer to PT evaluation   Cervical / Trunk Assessment Cervical / Trunk Assessment: Other exceptions Cervical / Trunk Exceptions: s/p back sx   Communication Communication Communication: No difficulties   Cognition Arousal/Alertness: Awake/alert Behavior During Therapy: WFL for tasks assessed/performed;Flat affect Overall Cognitive Status: Within Functional Limits for tasks assessed  General Comments       Exercises     Shoulder Instructions      Home Living Family/patient expects to be discharged to:: Private residence Living Arrangements: Spouse/significant other;Children(children are 9 and 12) Available Help at Discharge: Family;Available 24 hours/day Type of Home: House Home Access: Stairs to enter [employer] of Steps: 2   Home Layout: Two  level;Bed/bath upstairs   Alternate Level Stairs-Rails: Right;Left Bathroom Shower/Tub: Occupational psychologist: Standard     Home Equipment: Financial controller: Reacher        Prior Functioning/Environment Level of Independence: Independent        Comments: pt works as an Forensic psychologist, 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        OT Problem List:        OT Treatment/Interventions:      OT Goals(Current goals can be found in the care plan section) Acute Rehab OT Goals Patient Stated Goal: stop back pain  OT Frequency:     Barriers to D/C:            Co-evaluation              AM-PAC OT "6 Clicks" Daily Activity     Outcome Measure Help from another person eating meals?: None Help from another person taking care of personal grooming?: None Help from another person toileting, which includes using toliet, bedpan, or urinal?: None Help from another person bathing (including washing, rinsing, drying)?: None Help from another person to put on and taking off regular upper body clothing?: None Help from another person to put on and taking off regular lower body clothing?: None 6 Click Score: 24   End of Session Nurse Communication: Patient requests pain meds  Activity Tolerance: Patient tolerated treatment well Patient left: in bed;with call bell/phone within reach  OT Visit Diagnosis: Pain                Time: 6440-3474 OT Time Calculation (min): 22 min Charges:  OT General Charges $OT Visit: 1 Visit OT Evaluation $OT Eval Low Complexity: 1 Low  Nestor Lewandowsky, OTR/L Acute Rehabilitation Services Pager: [phone] Office: [phone]  Malka So 03/28/2019, 10:08 AM

## 2019-03-30 ENCOUNTER — Encounter (HOSPITAL_COMMUNITY): Payer: Self-pay | Admitting: Neurological Surgery

## 2019-03-31 MED FILL — Sodium Chloride IV Soln 0.9%: INTRAVENOUS | Qty: 1000 | Status: AC

## 2019-03-31 MED FILL — Heparin Sodium (Porcine) Inj 1000 Unit/ML: INTRAMUSCULAR | Qty: 30 | Status: AC

## 2019-04-20 ENCOUNTER — Other Ambulatory Visit: Payer: Self-pay

## 2019-04-20 MED ORDER — ATORVASTATIN CALCIUM 40 MG PO TABS: 40.0000 mg | ORAL_TABLET | Freq: Every day | ORAL | 3 refills | Status: DC

## 2019-04-20 MED ORDER — FENOFIBRATE 145 MG PO TABS: 145.0000 mg | ORAL_TABLET | Freq: Every day | ORAL | 3 refills | Status: DC

## 2019-05-07 DIAGNOSIS — M545 Low back pain: Secondary | ICD-10-CM | POA: Diagnosis not present

## 2019-05-29 DIAGNOSIS — Z6831 Body mass index (BMI) 31.0-31.9, adult: Secondary | ICD-10-CM | POA: Diagnosis not present

## 2019-05-29 DIAGNOSIS — F11220 Opioid dependence with intoxication, uncomplicated: Secondary | ICD-10-CM | POA: Diagnosis not present

## 2019-05-29 DIAGNOSIS — M545 Low back pain: Secondary | ICD-10-CM | POA: Diagnosis not present

## 2019-06-08 DIAGNOSIS — H6981 Other specified disorders of Eustachian tube, right ear: Secondary | ICD-10-CM | POA: Diagnosis not present

## 2019-06-08 DIAGNOSIS — H7201 Central perforation of tympanic membrane, right ear: Secondary | ICD-10-CM | POA: Diagnosis not present

## 2019-06-08 DIAGNOSIS — H903 Sensorineural hearing loss, bilateral: Secondary | ICD-10-CM | POA: Diagnosis not present

## 2019-06-18 DIAGNOSIS — M545 Low back pain: Secondary | ICD-10-CM | POA: Diagnosis not present

## 2019-07-20 DIAGNOSIS — H6983 Other specified disorders of Eustachian tube, bilateral: Secondary | ICD-10-CM | POA: Diagnosis not present

## 2019-07-20 DIAGNOSIS — H7201 Central perforation of tympanic membrane, right ear: Secondary | ICD-10-CM | POA: Diagnosis not present

## 2019-08-20 DIAGNOSIS — M545 Low back pain: Secondary | ICD-10-CM | POA: Diagnosis not present

## 2019-08-25 ENCOUNTER — Other Ambulatory Visit: Payer: Self-pay | Admitting: Student

## 2019-08-25 DIAGNOSIS — M545 Low back pain, unspecified: Secondary | ICD-10-CM

## 2019-08-25 DIAGNOSIS — M5416 Radiculopathy, lumbar region: Secondary | ICD-10-CM | POA: Diagnosis not present

## 2019-08-25 DIAGNOSIS — F11220 Opioid dependence with intoxication, uncomplicated: Secondary | ICD-10-CM | POA: Diagnosis not present

## 2019-09-03 ENCOUNTER — Ambulatory Visit
Admission: RE | Admit: 2019-09-03 | Discharge: 2019-09-03 | Disposition: A | Discharge status: Home / Self Care | Payer: BC Managed Care – PPO | Source: Ambulatory Visit | Attending: Student | Admitting: Student

## 2019-09-03 DIAGNOSIS — M4326 Fusion of spine, lumbar region: Secondary | ICD-10-CM | POA: Diagnosis not present

## 2019-09-03 DIAGNOSIS — M545 Low back pain, unspecified: Secondary | ICD-10-CM

## 2019-09-04 ENCOUNTER — Other Ambulatory Visit: Payer: BC Managed Care – PPO

## 2019-09-17 DIAGNOSIS — R030 Elevated blood-pressure reading, without diagnosis of hypertension: Secondary | ICD-10-CM | POA: Diagnosis not present

## 2019-09-17 DIAGNOSIS — Z6832 Body mass index (BMI) 32.0-32.9, adult: Secondary | ICD-10-CM | POA: Diagnosis not present

## 2019-09-17 DIAGNOSIS — M545 Low back pain: Secondary | ICD-10-CM | POA: Diagnosis not present

## 2019-11-11 DIAGNOSIS — Z125 Encounter for screening for malignant neoplasm of prostate: Secondary | ICD-10-CM | POA: Diagnosis not present

## 2019-11-11 DIAGNOSIS — I10 Essential (primary) hypertension: Secondary | ICD-10-CM | POA: Diagnosis not present

## 2019-11-11 DIAGNOSIS — E7800 Pure hypercholesterolemia, unspecified: Secondary | ICD-10-CM | POA: Diagnosis not present

## 2019-11-11 DIAGNOSIS — Z0000 Encounter for general adult medical examination without abnormal findings: Secondary | ICD-10-CM | POA: Diagnosis not present

## 2019-11-11 DIAGNOSIS — E291 Testicular hypofunction: Secondary | ICD-10-CM | POA: Diagnosis not present

## 2019-11-18 DIAGNOSIS — Z0000 Encounter for general adult medical examination without abnormal findings: Secondary | ICD-10-CM | POA: Diagnosis not present

## 2019-11-18 DIAGNOSIS — E7800 Pure hypercholesterolemia, unspecified: Secondary | ICD-10-CM | POA: Diagnosis not present

## 2019-11-18 DIAGNOSIS — R82998 Other abnormal findings in urine: Secondary | ICD-10-CM | POA: Diagnosis not present

## 2019-11-18 DIAGNOSIS — I10 Essential (primary) hypertension: Secondary | ICD-10-CM | POA: Diagnosis not present

## 2019-11-18 DIAGNOSIS — E291 Testicular hypofunction: Secondary | ICD-10-CM | POA: Diagnosis not present

## 2019-11-18 DIAGNOSIS — M4326 Fusion of spine, lumbar region: Secondary | ICD-10-CM | POA: Diagnosis not present

## 2019-11-18 DIAGNOSIS — R7301 Impaired fasting glucose: Secondary | ICD-10-CM | POA: Diagnosis not present

## 2019-11-18 DIAGNOSIS — Z1331 Encounter for screening for depression: Secondary | ICD-10-CM | POA: Diagnosis not present

## 2019-11-20 DIAGNOSIS — F11220 Opioid dependence with intoxication, uncomplicated: Secondary | ICD-10-CM | POA: Diagnosis not present

## 2019-11-20 DIAGNOSIS — M5416 Radiculopathy, lumbar region: Secondary | ICD-10-CM | POA: Diagnosis not present

## 2019-11-20 DIAGNOSIS — M545 Low back pain: Secondary | ICD-10-CM | POA: Diagnosis not present

## 2019-11-26 DIAGNOSIS — Z1212 Encounter for screening for malignant neoplasm of rectum: Secondary | ICD-10-CM | POA: Diagnosis not present

## 2019-12-01 ENCOUNTER — Ambulatory Visit: Payer: BC Managed Care – PPO | Admitting: Physical Therapy

## 2019-12-17 DIAGNOSIS — M5416 Radiculopathy, lumbar region: Secondary | ICD-10-CM | POA: Diagnosis not present

## 2020-01-20 DIAGNOSIS — H7201 Central perforation of tympanic membrane, right ear: Secondary | ICD-10-CM | POA: Diagnosis not present

## 2020-01-20 DIAGNOSIS — H6981 Other specified disorders of Eustachian tube, right ear: Secondary | ICD-10-CM | POA: Diagnosis not present

## 2020-02-15 DIAGNOSIS — M961 Postlaminectomy syndrome, not elsewhere classified: Secondary | ICD-10-CM | POA: Diagnosis not present

## 2020-02-15 DIAGNOSIS — F11220 Opioid dependence with intoxication, uncomplicated: Secondary | ICD-10-CM | POA: Diagnosis not present

## 2020-02-15 DIAGNOSIS — M5416 Radiculopathy, lumbar region: Secondary | ICD-10-CM | POA: Diagnosis not present

## 2020-03-02 DIAGNOSIS — M5416 Radiculopathy, lumbar region: Secondary | ICD-10-CM | POA: Diagnosis not present

## 2020-03-16 DIAGNOSIS — M47816 Spondylosis without myelopathy or radiculopathy, lumbar region: Secondary | ICD-10-CM | POA: Diagnosis not present

## 2020-03-28 DIAGNOSIS — M47816 Spondylosis without myelopathy or radiculopathy, lumbar region: Secondary | ICD-10-CM | POA: Diagnosis not present

## 2020-03-30 DIAGNOSIS — M47816 Spondylosis without myelopathy or radiculopathy, lumbar region: Secondary | ICD-10-CM | POA: Diagnosis not present

## 2020-04-04 DIAGNOSIS — M47816 Spondylosis without myelopathy or radiculopathy, lumbar region: Secondary | ICD-10-CM | POA: Diagnosis not present

## 2020-04-06 DIAGNOSIS — M47816 Spondylosis without myelopathy or radiculopathy, lumbar region: Secondary | ICD-10-CM | POA: Diagnosis not present

## 2020-04-11 DIAGNOSIS — M47816 Spondylosis without myelopathy or radiculopathy, lumbar region: Secondary | ICD-10-CM | POA: Diagnosis not present

## 2020-04-13 DIAGNOSIS — M47816 Spondylosis without myelopathy or radiculopathy, lumbar region: Secondary | ICD-10-CM | POA: Diagnosis not present

## 2020-04-13 DIAGNOSIS — Z79891 Long term (current) use of opiate analgesic: Secondary | ICD-10-CM | POA: Diagnosis not present

## 2020-04-18 DIAGNOSIS — M47816 Spondylosis without myelopathy or radiculopathy, lumbar region: Secondary | ICD-10-CM | POA: Diagnosis not present

## 2020-04-25 DIAGNOSIS — M47816 Spondylosis without myelopathy or radiculopathy, lumbar region: Secondary | ICD-10-CM | POA: Diagnosis not present

## 2020-05-02 DIAGNOSIS — M47816 Spondylosis without myelopathy or radiculopathy, lumbar region: Secondary | ICD-10-CM | POA: Diagnosis not present

## 2020-05-04 DIAGNOSIS — M47816 Spondylosis without myelopathy or radiculopathy, lumbar region: Secondary | ICD-10-CM | POA: Diagnosis not present

## 2020-05-11 DIAGNOSIS — Z79891 Long term (current) use of opiate analgesic: Secondary | ICD-10-CM | POA: Diagnosis not present

## 2020-07-13 DIAGNOSIS — Z79891 Long term (current) use of opiate analgesic: Secondary | ICD-10-CM | POA: Diagnosis not present

## 2020-07-20 DIAGNOSIS — H6981 Other specified disorders of Eustachian tube, right ear: Secondary | ICD-10-CM | POA: Diagnosis not present

## 2020-07-20 DIAGNOSIS — H903 Sensorineural hearing loss, bilateral: Secondary | ICD-10-CM | POA: Diagnosis not present

## 2020-07-20 DIAGNOSIS — H7201 Central perforation of tympanic membrane, right ear: Secondary | ICD-10-CM | POA: Diagnosis not present

## 2020-08-23 ENCOUNTER — Other Ambulatory Visit: Payer: Self-pay

## 2020-08-23 ENCOUNTER — Ambulatory Visit: Payer: BC Managed Care – PPO | Admitting: Primary Care

## 2020-08-23 ENCOUNTER — Encounter: Payer: Self-pay | Admitting: Primary Care

## 2020-08-23 DIAGNOSIS — R058 Other specified cough: Secondary | ICD-10-CM

## 2020-08-23 MED ORDER — PREDNISONE 10 MG PO TABS: ORAL_TABLET | ORAL | 0 refills | Status: DC

## 2020-08-23 MED ORDER — AZELASTINE HCL 0.1 % NA SOLN: 1.0000 | Freq: Two times a day (BID) | NASAL | 0 refills | Status: DC

## 2020-08-23 MED ORDER — HYDROCODONE BIT-HOMATROP MBR 5-1.5 MG/5ML PO SOLN: 5.0000 mL | Freq: Every evening | ORAL | 0 refills | Status: DC | PRN

## 2020-08-23 NOTE — Progress Notes (Signed)
@Patient  ID: , male    DOB: 1971/01/26, 50 y.o.   MRN: [id number]  Chief Complaint  Patient presents with  . Follow-up    Cough, congestion for 1 week, neg covid    Referring provider: Tisovec, [id number], MD  HPI: 50 year old male, never smoked. Hx obesity, hyperlipidemia, UACS, dysfunction both eustachian tubes. Follows with Dr. 52 for chronic rhinitis and cough. Last seen on 07/15/18. UACS onset around 30, intermittent with URI trigger. Allergies to dog, cat, ragweed. IgE 87. Methacholine challenge negative for asthma.   08/23/2020 Patient presents today for acute visit/ UACS follow-up. He currently has a dry cough which started 5 days ago. Associated chest tightness and congestion. No significant sinusitis symptoms. He has a bad headache. He has tried sudafed and benadryl at night without  improvement. He restarted Symbicort on Thursday 4/28. He does not have rescue inhaler. Cough is keeping him up at night. He is compliant with omeprazole. Covid test negative x2. Denies f/c/s, shortness of breath, wheezing.   Dyspnea: None Cough: Dry/hacking cough  Sleeping: 2 pillows, sleeps on side  SABA use: None  O2: None  Pulmonary testing/significant events:  - RX singulair 07/18/2013 > improved 08/21/13 > try off singulair 03/02/14 as pt not convinced helped - Sinus CT 08/28/13 > Sinuses are essentially clear with only minor dependent secretions noted in the inferior right frontal and left maxillary sinuses.There is anatomic narrowing of the ostiomeatal complexes, incompletely evaluated on this limited sinus study. - 02/26/2014 refer to ENT ([facility])> neg sinus dz/ pos ET dysfunction rec flonase  - 03/26/14  Allergy profile:  eos 0,  IGgE 87 Dog=cat, dust, ragweed  - 04/19/14  PFTs wnl including fef 25-75 off singulair and all bronchodilators  - NO 04/14/2015 during flare = 26  - Methacholine challenge 05/05/14 > neg for asthma - CXR 05/29/18 - No active cardiopulmonary  disease.   Allergies  Allergen Reactions  . Demerol [Meperidine] Other (See Comments)    Unknown reaction type  . Morphine And Related Itching    Immunization History  Administered Date(s) Administered  . Influenza Split 01/24/2014, 01/29/2015, 01/22/2016, 02/18/2017  . Influenza,inj,Quad PF,6+ Mos 02/04/2017, 01/28/2018  . Influenza-Unspecified 01/15/2016, 12/21/2018, 03/05/2020  . Td 04/01/2011    Past Medical History:  Diagnosis Date  . Asthma   . Chronic back pain   . Depression   . Essential (primary) hypertension   . GERD (gastroesophageal reflux disease)   . Hyperlipidemia   . Pneumonia 2015  . Post laminectomy syndrome     Tobacco History: Social History   Tobacco Use  Smoking Status Never Smoker  Smokeless Tobacco Never Used   Counseling given: Not Answered   Outpatient Medications Prior to Visit  Medication Sig Dispense Refill  . aspirin 81 MG tablet Take 81 mg by mouth daily.    14/12/2010 atorvastatin (LIPITOR) 40 MG tablet Take 1 tablet (40 mg total) by mouth daily. 90 tablet 3  . budesonide-formoterol (SYMBICORT) 160-4.5 MCG/ACT inhaler Inhale 2 puffs into the lungs daily. (Patient taking differently: Inhale 2 puffs into the lungs 2 (two) times daily as needed (respiratory issues.).) 1 Inhaler 3  . DULoxetine (CYMBALTA) 60 MG capsule Take 60 mg by mouth daily.     . famotidine (PEPCID) 20 MG tablet Take 20 mg by mouth at bedtime.     . fenofibrate (TRICOR) 145 MG tablet Take 1 tablet (145 mg total) by mouth daily. 90 tablet 3  . fexofenadine (ALLEGRA) 180 MG  tablet Take 180 mg by mouth daily.    . methocarbamol (ROBAXIN) 500 MG tablet Take 1 tablet (500 mg total) by mouth every 6 (six) hours as needed for muscle spasms. 28 tablet 1  . nitroGLYCERIN (NITROSTAT) 0.4 MG SL tablet Place 1 tablet (0.4 mg total) under the tongue every 5 (five) minutes as needed for chest pain. 25 tablet 3  . nortriptyline (PAMELOR) 25 MG capsule Take 25 mg by mouth at bedtime.     Marland Kitchen  oxyCODONE 10 MG TABS Take 1 tablet (10 mg total) by mouth every 4 (four) hours as needed for severe pain ((score 7 to 10)). 42 tablet 0  . traZODone (DESYREL) 100 MG tablet Take 100 mg by mouth at bedtime.   5  . acetaminophen (TYLENOL) 325 MG tablet Take 2 tablets (650 mg total) by mouth every 4 (four) hours as needed for mild pain ((score 1 to 3) or temp > 100.5). (Patient not taking: Reported on 08/23/2020)    . albuterol (PROAIR HFA) 108 (90 Base) MCG/ACT inhaler 2 puffs every 4 hours as needed only  if your can't catch your breath (Patient not taking: Reported on 08/23/2020) 1 Inhaler 0  . celecoxib (CELEBREX) 200 MG capsule Take 1 capsule (200 mg total) by mouth every 12 (twelve) hours. 60 capsule 0  . XTAMPZA ER 13.5 MG C12A Take 13.5 mg by mouth 2 (two) times daily.     No facility-administered medications prior to visit.   Review of Systems  Review of Systems  Constitutional: Negative.   HENT: Positive for congestion and postnasal drip. Negative for sinus pressure and sinus pain.   Respiratory: Positive for cough and chest tightness. Negative for shortness of breath and wheezing.   Neurological: Positive for headaches.     Physical Exam  BP 118/70 (BP Location: Right Arm, Cuff Size: Normal)   Pulse 94   Temp (!) 97.1 F (36.2 C) (Temporal)   Ht 6' (1.829 m)   Wt 243 lb 3.2 oz (110.3 kg)   SpO2 99% Comment: RA  BMI 32.98 kg/m  Physical Exam Constitutional:      Appearance: Normal appearance.  HENT:     Nose: No nasal tenderness or congestion.     Right Sinus: No maxillary sinus tenderness or frontal sinus tenderness.     Left Sinus: No maxillary sinus tenderness or frontal sinus tenderness.     Mouth/Throat:     Mouth: Mucous membranes are moist.     Pharynx: Oropharynx is clear.  Cardiovascular:     Rate and Rhythm: Normal rate and regular rhythm.  Pulmonary:     Effort: Pulmonary effort is normal.     Breath sounds: Normal breath sounds.  Neurological:     General:  No focal deficit present.     Mental Status: He is alert and oriented to person, place, and time. Mental status is at baseline.  Psychiatric:        Mood and Affect: Mood normal.        Behavior: Behavior normal.        Thought Content: Thought content normal.        Judgment: Judgment normal.      Lab Results:  CBC    Component Value Date/Time   WBC 7.9 03/24/2019 1100   RBC 5.47 03/24/2019 1100   HGB 15.4 03/24/2019 1100   HGB 14.4 01/16/2019 1014   HCT 46.9 03/24/2019 1100   HCT 43.5 01/16/2019 1014   PLT 297 03/24/2019 1100  PLT 293 01/16/2019 1014   MCV 85.7 03/24/2019 1100   MCV 84 01/16/2019 1014   MCH 28.2 03/24/2019 1100   MCHC 32.8 03/24/2019 1100   RDW 12.6 03/24/2019 1100   RDW 12.4 01/16/2019 1014   LYMPHSABS 2.5 03/24/2019 1100   MONOABS 0.7 03/24/2019 1100   EOSABS 0.5 03/24/2019 1100   BASOSABS 0.1 03/24/2019 1100    BMET    Component Value Date/Time   NA 138 03/24/2019 1100   NA 141 01/18/2017 1658   K 4.3 03/24/2019 1100   CL 99 03/24/2019 1100   CO2 23 03/24/2019 1100   GLUCOSE 119 (H) 03/24/2019 1100   BUN 13 03/24/2019 1100   BUN 17 01/18/2017 1658   CREATININE 1.39 (H) 03/24/2019 1100   CALCIUM 10.0 03/24/2019 1100   GFRNONAA >60 03/24/2019 1100   GFRAA >60 03/24/2019 1100    BNP No results found for: BNP  ProBNP No results found for: PROBNP  Imaging: No results found.   Assessment & Plan:   Upper airway cough syndrome - Following with Dr. Sherene Sires for UACS since 2015. He has been doing well the past two years. Develop dry cough 5 days ago with associated chest tightness. Recurrent cough likely due to one of the following GERD, asthma or PND. Patient is vaccinated for covid and received booster. Home covid test negative x 2. Methacholine challenge 05/05/14 was negative. FENO during flare in 2016 was 26. No signs of URI or bacterial infection. Abx not indicated at this time.   Recommendations: - Follow GERD diet and continue  Omeprazole 40mg  before breakfast and Pepcid 20mg  at bedtime - Stay on Symbicort 160 two puffs twice daily as he has been for the next week or two until cough has resolved - Sending in prednisone taper 40mg  x 3 days; 20mg  x 3 days; 10mg  x 3 days. He can take Hycodan cough syrup 68ml at bedtime for cough suppression - Starting Astelin nasal spray for suspected PND driving his cough - Return/call if symptoms do not improve or worsen      , NP 08/23/2020

## 2020-08-23 NOTE — Patient Instructions (Addendum)
  Recommendations:  - Prednisone taper as directed  - Hycodan at bedtime for cough suppression (do not combine with other medication) - Start astelin nasal spray 1 per nostril twice daily for PND/allergy symptoms which could be driving cough  - Continue to take Over the counter Omeprazole (or Pantoprazole) in morning before breakfast and Famotidine (pepcid) 20mg  at bedtime  - Follow GERD diet  - Call if you develop productive cough with purulent mucus, fever or nasal congestion

## 2020-08-23 NOTE — Assessment & Plan Note (Addendum)
-   Following with Dr. Sherene Sires for UACS since 2015. He has been doing well the past two years. Develop dry cough 5 days ago with associated chest tightness. Recurrent cough likely due to one of the following GERD, asthma or PND. Patient is vaccinated for covid and received booster. Home covid test negative x 2. Methacholine challenge 05/05/14 was negative. FENO during flare in 2016 was 26. No signs of URI or bacterial infection. Abx not indicated at this time.   Recommendations: - Follow GERD diet and continue Omeprazole 40mg  before breakfast and Pepcid 20mg  at bedtime - Stay on Symbicort 160 two puffs twice daily as he has been for the next week or two until cough has resolved - Sending in prednisone taper 40mg  x 3 days; 20mg  x 3 days; 10mg  x 3 days. He can take Hycodan cough syrup 23ml at bedtime for cough suppression - Starting Astelin nasal spray for suspected PND driving his cough - Return/call if symptoms do not improve or worsen

## 2020-09-08 NOTE — Progress Notes (Signed)
Cardiology Office Note   Date:  09/09/2020   ID:  Luke Wong, Luke Wong Aug 20, 1970, MRN [id number]  PCP:  Haywood Pao, MD  Cardiologist:   Dorris Carnes, MD    F/U of hyperlipidemia and BP       History of Present Illness: Luke Wong is a 50 y.o. male with a history of hyperlipidemia and a very strong family history of CAD (no male lived after 5 due to heart problems)  Note that CT Ca score was 0 in pastHe has a family history of CAD  (father with CABG in 85s, MI in 3s; paternal GF and uncle also with early CAD)In addition to Ca score the pt has had stress tests  Last Lexiscan myovue for CP was in 2017  This showed a small, fixed defect that was probable artifact   LVEF 54%.  P  I saw the pt in 2020   He complained of intermitt CP that occurred usually at night   Substernal, not associated with activity   Lasting about 5 min.     Since then he continues to have intermitt spells   Again, mostly at night   He  Takes Pepcid which may help       Outpatient Medications Prior to Visit  Medication Sig Dispense Refill  . acetaminophen (TYLENOL) 325 MG tablet Take 2 tablets (650 mg total) by mouth every 4 (four) hours as needed for mild pain ((score 1 to 3) or temp > 100.5).    Marland Kitchen albuterol (PROAIR HFA) 108 (90 Base) MCG/ACT inhaler 2 puffs every 4 hours as needed only  if your can't catch your breath (Patient taking differently: 2 puffs every 4 hours as needed only  if your can't catch your breath) 1 Inhaler 0  . aspirin 81 MG tablet Take 81 mg by mouth daily.    Marland Kitchen atorvastatin (LIPITOR) 40 MG tablet Take 1 tablet (40 mg total) by mouth daily. 90 tablet 3  . azelastine (ASTELIN) 0.1 % nasal spray Place 1 spray into both nostrils 2 (two) times daily. Use in each nostril as directed (Patient taking differently: Place 1 spray into both nostrils 2 (two) times daily. AS NEEDED) 30 mL 0  . budesonide-formoterol (SYMBICORT) 160-4.5 MCG/ACT inhaler Inhale 2 puffs into the lungs daily. (Patient taking  differently: Inhale 2 puffs into the lungs 2 (two) times daily as needed (respiratory issues.).) 1 Inhaler 3  . DULoxetine (CYMBALTA) 60 MG capsule Take 60 mg by mouth daily.     . famotidine (PEPCID) 20 MG tablet Take 20 mg by mouth at bedtime.     . fenofibrate (TRICOR) 145 MG tablet Take 1 tablet (145 mg total) by mouth daily. 90 tablet 3  . fexofenadine (ALLEGRA) 180 MG tablet Take 180 mg by mouth daily.    . methocarbamol (ROBAXIN) 500 MG tablet Take 1 tablet (500 mg total) by mouth every 6 (six) hours as needed for muscle spasms. 28 tablet 1  . nitroGLYCERIN (NITROSTAT) 0.4 MG SL tablet Place 1 tablet (0.4 mg total) under the tongue every 5 (five) minutes as needed for chest pain. 25 tablet 3  . nortriptyline (PAMELOR) 25 MG capsule Take 25 mg by mouth at bedtime.     Marland Kitchen oxyCODONE 10 MG TABS Take 1 tablet (10 mg total) by mouth every 4 (four) hours as needed for severe pain ((score 7 to 10)). 42 tablet 0  . predniSONE (DELTASONE) 10 MG tablet Take 4 tabs po daily x 3  days; then 2 tabs daily x3 days; then 1 tab daily x 3 days; then stop 21 tablet 0  . traZODone (DESYREL) 100 MG tablet Take 100 mg by mouth at bedtime.   5  . HYDROcodone bit-homatropine (HYCODAN) 5-1.5 MG/5ML syrup Take 5 mLs by mouth at bedtime and may repeat dose one time if needed. (Patient not taking: Reported on 09/09/2020) 120 mL 0   No facility-administered medications prior to visit.     Allergies:   Meperidine   Past Medical History:  Diagnosis Date  . Asthma   . Chronic back pain   . Depression   . Essential (primary) hypertension   . GERD (gastroesophageal reflux disease)   . Hyperlipidemia   . Pneumonia 2015  . Post laminectomy syndrome     Past Surgical History:  Procedure Laterality Date  . ABDOMINAL EXPOSURE N/A 03/27/2019   Procedure: ABDOMINAL EXPOSURE;  Surgeon: Marty Heck, MD;  Location: [facility];  Service: Vascular;  Laterality: N/A;  anterior  . ANKLE SURGERY Left 06/07/13, 3/16    Arthroscopic debridement  x2  . ANTERIOR LUMBAR FUSION N/A 03/27/2019   Procedure: ANTERIOR LUMBAR INTERBODY FUSION LUMBAR FIVE-SACRAL- ONE.;  Surgeon: Eustace Moore, MD;  Location: [facility];  Service: Neurosurgery;  Laterality: N/A;  ANTERIOR APPROACH  . BACK SURGERY    . HERNIA REPAIR Bilateral 1994  . LUMBAR LAMINECTOMY/ DECOMPRESSION WITH MET-RX Right 11/30/2014   Procedure: Right L4-5 Far Lateral Microdiskectomy w/ Metrx;  Surgeon: Kevan Ny Ditty, MD;  Location: [facility];  Service: Neurosurgery;  Laterality: Right;  Right L4-5 Far Lateral Microdiskectomy  . MYRINGOTOMY WITH TUBE PLACEMENT Right 10/28/2018   Procedure: RIGHT MYRINGOTOMY WITH  T TUBE PLACEMENT;  Surgeon: Leta Baptist, MD;  Location: [facility];  Service: ENT;  Laterality: Right;  . SPINAL FUSION       Social History:  The patient  reports that he has never smoked. He has never used smokeless tobacco. He reports current alcohol use. He reports that he does not use drugs.   Family History:  The patient's family history includes Heart attack in his father and paternal grandfather; Heart disease in his father; Thyroid cancer in his mother.    ROS:  Please see the history of present illness. All other systems are reviewed and  Negative to the above problem except as noted.    PHYSICAL EXAM: VS:  BP 120/78   Pulse 91   Ht 6' (1.829 m)   Wt 248 lb (112.5 kg)   BMI 33.63 kg/m   GEN:  Obese 50 yo in NAD    Neck: JVP is normal   No carotid bruits Cardiac: RRR; no murmurs  No LE  edema  Respiratory:  clear to auscultation bilaterally GI: soft, nontender, nondistended, + BS  No hepatomegaly  MS: no deformity Moving all extremities   Skin: warm and dry, no rash Neuro:  Strength and sensation are intact Psych: euthymic mood, full affect   EKG:  EKG shows SR 91 bpm      Lipid Panel    Component Value Date/Time   CHOL 165 01/18/2017 1658   TRIG 572 (HH) 01/18/2017 1658   HDL 33 (L) 01/18/2017 1658    CHOLHDL 5.0 01/18/2017 1658   CHOLHDL 7.0 (H) 11/25/2015 0917   VLDL 47 (H) 11/25/2015 0917   LDLCALC Comment 01/18/2017 1658   LDLDIRECT 113.0 09/16/2014 0814      Wt Readings from Last 3 Encounters:  09/09/20 248  lb (112.5 kg)  08/23/20 243 lb 3.2 oz (110.3 kg)  03/27/19 230 lb (104.3 kg)      ASSESSMENT AND PLAN:  1  CP   Remains atypical for cardiac   Not associated with activity   Discussed considering a GI evaluation (for colonoscopy since he is 50 and possible upper eval. He is still anxious given FHx   Will get Ca score as weel to see if plaquing developed  2  HL  Last lipids showed LDL 89  HDL 34   CT scan will guide Rx further  3  HTN   BP is controlled  Continue to follow   F/U next spring   Signed, Niam Nepomuceno, MD  09/09/2020 3:07 PM    [facility] [place], [place], [place]  [address] Phone: [phone]; Fax: [phone]

## 2020-09-09 ENCOUNTER — Other Ambulatory Visit: Payer: Self-pay

## 2020-09-09 ENCOUNTER — Ambulatory Visit: Payer: BC Managed Care – PPO | Admitting: Internal Medicine

## 2020-09-09 ENCOUNTER — Encounter: Payer: Self-pay | Admitting: Internal Medicine

## 2020-09-09 VITALS — BP 120/78 | HR 91 | Ht 72.0 in | Wt 248.0 lb

## 2020-09-09 DIAGNOSIS — E782 Mixed hyperlipidemia: Secondary | ICD-10-CM

## 2020-09-09 NOTE — Patient Instructions (Signed)
Medication Instructions:  No changes *If you need a refill on your cardiac medications before your next appointment, please call your pharmacy*   Lab Work: none If you have labs (blood work) drawn today and your tests are completely normal, you will receive your results only by: Marland Kitchen MyChart Message (if you have MyChart) OR . A paper copy in the mail If you have any lab test that is abnormal or we need to change your treatment, we will call you to review the results.   Testing/Procedures: Calcium Score CT scan   Follow-Up: At [facility], you and your health needs are our priority.  As part of our continuing mission to provide you with exceptional heart care, we have created designated Provider Care Teams.  These Care Teams include your primary Cardiologist (physician) and Advanced Practice Providers (APPs -  Physician Assistants and Nurse Practitioners) who all work together to provide you with the care you need, when you need it.   Your next appointment:   12 month(s)  The format for your next appointment:   In Person  Provider:   You may see Dietrich Pates, MD or one of the following Advanced Practice Providers on your designated Care Team:    Tereso Newcomer, PA-C  Chelsea Aus, [place]  Other Instructions

## 2020-10-04 ENCOUNTER — Telehealth: Payer: Self-pay | Admitting: Internal Medicine

## 2020-10-04 NOTE — Telephone Encounter (Signed)
Called and spoke with patient, advised of information per MR regarding hycodan cough syrup.  Patient verbalized understanding.  Nothing further needed.

## 2020-10-04 NOTE — Telephone Encounter (Signed)
Okay to refill Hycodan but I will probably only be able to electronically fingerprint signed tomorrow 10/05/2020 when I am in the hospital rotation

## 2020-10-04 NOTE — Telephone Encounter (Signed)
Primary Pulmonologist: Dr. Sherene Sires Last office visit and with whom: 08/23/20 with Ames Dura What do we see them for (pulmonary problems): Upper airway cough syndrome Last OV assessment/plan: see below  Was appointment offered to patient (explain)?    Upper airway cough syndrome - Following with Dr. Sherene Sires for UACS since 2015. He has been doing well the past two years. Develop dry cough 5 days ago with associated chest tightness. Recurrent cough likely due to one of the following GERD, asthma or PND. Patient is vaccinated for covid and received booster. Home covid test negative x 2. Methacholine challenge 05/05/14 was negative. FENO during flare in 2016 was 26. No signs of URI or bacterial infection. Abx not indicated at this time.    Recommendations: - Follow GERD diet and continue Omeprazole 40mg  before breakfast and Pepcid 20mg  at bedtime - Stay on Symbicort 160 two puffs twice daily as he has been for the next week or two until cough has resolved - Sending in prednisone taper 40mg  x 3 days; 20mg  x 3 days; 10mg  x 3 days. He can take Hycodan cough syrup 67ml at bedtime for cough suppression - Starting Astelin nasal spray for suspected PND driving his cough - Return/call if symptoms do not improve or worsen          , NP 08/23/2020     Reason for call: I called and spoke with patient regarding cough. Patient stated that he has been having a dry, hacking cough for the last 3 days and keeps getting worse. No SOB, wheezing, chest pain/tightness, fever/chills or N/V. Patient has been using rescue inhaler, dayquil and nightquil with no relief. Not using symbicort inhaler. Requesting recs before "it gets in my chest" routing to MR and Dr. is off.  Dr. , please advise. Thanks!    (examples of things to ask: : When did symptoms start? Fever? Cough? Productive? Color to sputum? More sputum than usual? Wheezing? Have you needed increased oxygen? Are you taking your  respiratory medications? What over the counter measures have you tried?)  Allergies  Allergen Reactions   Meperidine Other (See Comments)    Unknown reaction type Pt unsure Unknown reaction type    Immunization History  Administered Date(s) Administered   Influenza Split 01/24/2014, 01/29/2015, 01/22/2016, 02/18/2017   Influenza,inj,Quad PF,6+ Mos 02/04/2017, 01/28/2018   Influenza-Unspecified 01/15/2016, 12/21/2018, 03/05/2020   Td 04/01/2011

## 2020-10-04 NOTE — Telephone Encounter (Signed)
As always flareups of chronic cough are very difficult to treat especially on the phone  Plan - Should go home her rapid COVID antigen test.  If this is positive should call us for flexible weight.  If it is negative he should go get a PCR test.  Please give him directions for the   - Can try empiric 5-day prednisone Please take prednisone 40 mg x1 day, then 30 mg x1 day, then 20 mg x1 day, then 10 mg x1 day, and then 5 mg x1 day and stop [please ensure he has no contraindications and has no previous side effects with prednisone]   Allergies  Allergen Reactions   Meperidine Other (See Comments)    Unknown reaction type Pt unsure Unknown reaction type

## 2020-10-04 NOTE — Telephone Encounter (Signed)
Called and spoke with patient, advised of recommendations per Dr. Marchelle Gearing.  He states he took a home rapid covid test today and it was negative.  His wife and son have the same symptoms, have both had 2 rapid tests and a pcr test and they were all negative.  He is requesting a refill of the Hycodan that Dr. Sherene Sires, 5 ml every 6 hours prn for cough (240 ml).  Please advise.  Thank you.

## 2020-10-05 ENCOUNTER — Other Ambulatory Visit: Payer: Self-pay | Admitting: Internal Medicine

## 2020-10-05 MED ORDER — HYDROCODONE BIT-HOMATROP MBR 5-1.5 MG/5ML PO SOLN: 5.0000 mL | Freq: Four times a day (QID) | ORAL | 0 refills | Status: DC | PRN

## 2020-10-05 MED ORDER — PREDNISONE 10 MG PO TABS: ORAL_TABLET | ORAL | 0 refills | Status: DC

## 2020-10-05 NOTE — Telephone Encounter (Signed)
Medication has been pended

## 2020-10-05 NOTE — Telephone Encounter (Signed)
Called and spoke with patient to let him know that Dr. Marchelle Gearing has sent in cough syrup and that Prednisone RX has also been sent in. Patient expressed understanding. Also called pharmacy to confirm they received it. They confirmed they received it. Nothing further needed at this time.

## 2020-10-05 NOTE — Telephone Encounter (Signed)
I do not see hycodan on his med list. If Sherene Sires has given hycodan in past then please send me a script for me to finger print sign then I can do it 10/05/2020  or 10/06/20

## 2020-10-05 NOTE — Telephone Encounter (Signed)
Called and spoke with patient. He was calling to check on the status of the prednisone and Hycodan from yesterday. I apologized to him as the prednisone should have been sent in at the very least. He is also aware that I will send a message over to MR to send in the Hycodan.   MR, please advise on the Hycodan from yesterday. Thanks!

## 2020-10-11 ENCOUNTER — Other Ambulatory Visit: Payer: Self-pay

## 2020-10-11 ENCOUNTER — Ambulatory Visit (INDEPENDENT_AMBULATORY_CARE_PROVIDER_SITE_OTHER)
Admission: RE | Admit: 2020-10-11 | Discharge: 2020-10-11 | Disposition: A | Discharge status: Home / Self Care | Payer: Self-pay | Source: Ambulatory Visit | Attending: Internal Medicine | Admitting: Internal Medicine

## 2020-10-11 DIAGNOSIS — E782 Mixed hyperlipidemia: Secondary | ICD-10-CM

## 2020-10-12 DIAGNOSIS — Z79891 Long term (current) use of opiate analgesic: Secondary | ICD-10-CM | POA: Diagnosis not present

## 2020-10-13 ENCOUNTER — Telehealth: Payer: Self-pay | Admitting: *Deleted

## 2020-10-13 DIAGNOSIS — R931 Abnormal findings on diagnostic imaging of heart and coronary circulation: Secondary | ICD-10-CM

## 2020-10-13 DIAGNOSIS — E782 Mixed hyperlipidemia: Secondary | ICD-10-CM

## 2020-10-13 DIAGNOSIS — I10 Essential (primary) hypertension: Secondary | ICD-10-CM

## 2020-10-13 NOTE — Telephone Encounter (Addendum)
-----   Message from Dietrich Pates V, MD sent at 10/12/2020  2:28 PM EDT ----- Calcium score is very mildly elevated meaning there is a very little bit of plaquing on the coronary arteries.   At 23 I do not think it is minimal  BUt, want to prevent it from increasing    I would get a lipomed panel with Lp(a) and also ApoB I am happy to review on phone/person but I cannot at present   Pricilla Riffle, MD  10/12/2020  2:31 PM EDT      CT also showed some hepatic steotosis   Fatty liver changes    WOuld highly recomm review of diet, cut back on carbs and sweets.    Can have dietary see (consult) to discuss   Changes would help with slowing ofany CAD progression as well    DO not want long term liver changes  Will forward to Dr Wylene Simmer as well [email or web address]   Video  WHy sugar is as bad as alcohol  There are other resources    Mediterranean diet recommended    __________________________________  I reviewed results and recommendations w patient.  He voices understanding and is in agreement to labs (appointment 6/24). Reviewed that he is taking Lipitor 40 mg and fenofibrate 145 mg every day.  Wishes to have a nutrition consult.  Order placed.

## 2020-10-14 ENCOUNTER — Other Ambulatory Visit: Payer: BC Managed Care – PPO | Admitting: *Deleted

## 2020-10-14 ENCOUNTER — Other Ambulatory Visit: Payer: Self-pay

## 2020-10-14 DIAGNOSIS — R931 Abnormal findings on diagnostic imaging of heart and coronary circulation: Secondary | ICD-10-CM | POA: Diagnosis not present

## 2020-10-14 DIAGNOSIS — E782 Mixed hyperlipidemia: Secondary | ICD-10-CM

## 2020-10-14 DIAGNOSIS — I10 Essential (primary) hypertension: Secondary | ICD-10-CM | POA: Diagnosis not present

## 2020-10-15 LAB — APOLIPOPROTEIN B: Apolipoprotein B: 88 mg/dL (ref <90)

## 2020-10-15 LAB — NMR, LIPOPROFILE
Cholesterol, Total: 174 mg/dL (ref 100–199)
HDL Particle Number: 27 umol/L — ABNORMAL LOW (ref >=30.5)
HDL-C: 30 mg/dL — ABNORMAL LOW (ref >39)
LDL Particle Number: 1378 nmol/L — ABNORMAL HIGH (ref <1000)
LDL Size: 20.9 nm (ref >20.5)
LDL-C (NIH Calc): 84 mg/dL (ref 0–99)
LP-IR Score: 84 — ABNORMAL HIGH (ref <=45)
Small LDL Particle Number: 396 nmol/L (ref <=527)
Triglycerides: 368 mg/dL — ABNORMAL HIGH (ref 0–149)

## 2020-10-15 LAB — LIPOPROTEIN A (LPA): Lipoprotein (a): 10.4 nmol/L (ref <75.0)

## 2020-11-15 NOTE — Telephone Encounter (Signed)
I have msg'd patient  He has appt in Dietary  on September 12th at 4 PM He is on lipitor and Tricor   Ca score is 23   Low but positive I have recomm he stay on same meds    Really need to dissect dietary habits, review areas for change

## 2020-11-30 DIAGNOSIS — E7800 Pure hypercholesterolemia, unspecified: Secondary | ICD-10-CM | POA: Diagnosis not present

## 2020-11-30 DIAGNOSIS — E291 Testicular hypofunction: Secondary | ICD-10-CM | POA: Diagnosis not present

## 2020-11-30 DIAGNOSIS — Z125 Encounter for screening for malignant neoplasm of prostate: Secondary | ICD-10-CM | POA: Diagnosis not present

## 2020-12-07 DIAGNOSIS — Z0000 Encounter for general adult medical examination without abnormal findings: Secondary | ICD-10-CM | POA: Diagnosis not present

## 2020-12-07 DIAGNOSIS — Z1331 Encounter for screening for depression: Secondary | ICD-10-CM | POA: Diagnosis not present

## 2020-12-07 DIAGNOSIS — I10 Essential (primary) hypertension: Secondary | ICD-10-CM | POA: Diagnosis not present

## 2020-12-07 DIAGNOSIS — E119 Type 2 diabetes mellitus without complications: Secondary | ICD-10-CM | POA: Diagnosis not present

## 2020-12-07 DIAGNOSIS — Z1339 Encounter for screening examination for other mental health and behavioral disorders: Secondary | ICD-10-CM | POA: Diagnosis not present

## 2020-12-07 DIAGNOSIS — R82998 Other abnormal findings in urine: Secondary | ICD-10-CM | POA: Diagnosis not present

## 2020-12-15 DIAGNOSIS — Z1212 Encounter for screening for malignant neoplasm of rectum: Secondary | ICD-10-CM | POA: Diagnosis not present

## 2020-12-16 ENCOUNTER — Ambulatory Visit: Payer: BC Managed Care – PPO | Admitting: Internal Medicine

## 2021-01-02 ENCOUNTER — Other Ambulatory Visit: Payer: Self-pay

## 2021-01-02 ENCOUNTER — Encounter: Payer: BC Managed Care – PPO | Attending: Internal Medicine | Admitting: Registered"

## 2021-01-02 ENCOUNTER — Encounter: Payer: Self-pay | Admitting: Registered"

## 2021-01-02 DIAGNOSIS — E782 Mixed hyperlipidemia: Secondary | ICD-10-CM | POA: Diagnosis not present

## 2021-01-02 NOTE — Progress Notes (Signed)
Medical Nutrition Therapy  Appointment Start time:  [phone]  Appointment End time:  1725  Primary concerns today: recent A1c 6.5% from PCP visit Referral diagnosis: E78.2 (ICD-10-CM) - Mixed hyperlipidemia from cardiologist referral Preferred learning style: no preference indicated Learning readiness: change in progress   NUTRITION ASSESSMENT   Anthropometrics  Not assessed this visit Wt Readings from Last 3 Encounters:  09/09/20 248 lb (112.5 kg)  08/23/20 243 lb 3.2 oz (110.3 kg)  03/27/19 230 lb (104.3 kg)      Clinical Medical Hx: elevated TG, LDL; low HDL Medications: Jardiance, Lipitor, tricor Labs: A1c 6.5%, HDL 21, TG 235, LDL 77  Lifestyle & Dietary Hx Pt states he has a strong family history of heart disease, grandfather and father died from heart disease before age of 60. Pt states although this is concerning to him he was concerned about recent elevated A1c and states he doesn't want to take insulin.  Patient states he had switched to sugar free options many years ago. Pt states he does not eat a lot of sweets.   Patient states he is a picky eater, doesn't like most meats, eats fruits about 1x/week. Patient states he does not like fish, but was taking fish oil supplements.  Patient states his diet varies somewhat depending on if he is working remote or in the office. He states 3 days remote and 2 days in the office Pt states he tends to eat more when in the office but not sure exactly why, he may be hungrier, stressed or tired.  Estimated daily fluid intake: 80 oz water, coffee, diet coke, unsweet tea (nutrasweet) Supplements: fish oil Sleep: 5-8 hr due to back pain Stress / self-care: movies, read Current average weekly physical activity: ADL occasionally walking the dog 20 min time varies.  24-Hr Dietary Recall First Meal: sandwich wheat or sour dough, 2 eggs, cheese Snack: snacks more in the office, pretzel sticks, almonds, trail mix trail mix (no  chocolate) Second Meal: (skips not hungry at home) veggie or [place] sandwich, 5 grain bread, sometimes baked lays, diet coke OR burrito bowl with rice or quinoa Snack: none Third Meal: lental pasta, sauce or pasta salad with garbanzo beans, vinegar oil salad dressing OR small potatoes cheese and [place] bacon & veggies OR burgers 1x week Snack: occasionally 2 mini ice cream cones or 1 c dry cereal 2x/weeks (rice crispies or rasin bran) Beverages: water, diet coke, occasional beer, 0-3 bourbon, coffee with non-nutritive sweetener  Estimated Energy Needs Calories: 2100 (for mild weight loss)   NUTRITION DIAGNOSIS  NB-1.1 Food and nutrition-related knowledge deficit As related to foods that contain carbohydrates, how they relate to triglycerides.  As evidenced by patient stated gain of new information.   NUTRITION INTERVENTION  Nutrition education (E-1) on the following topics:  Types of Fat and effect on the lipid panel Carbohydrates Simple (added) sugar Nutrition facts labels  Handouts Provided Include  Types of Fat Cholesterol and Triglycerides Nutrition facts label Sugar in beverages Soluble fiber (to help address mixed hyperlipidemia) Sleep hygiene  Learning Style & Readiness for Change Teaching method utilized: Visual & Auditory  Demonstrated degree of understanding via: Teach Back  Barriers to learning/adherence to lifestyle change: pain limiting physical activity and reducing quality of sleep  Goals Established by Pt If you want to try taking high dose fish oil (4 grams/day), check with your doctor or pharmacist to make sure it is not contraindicated with your other medications. Continue eating foods that can help reduce  inflammation such as whole grains, fruits and vegetables. Continue choosing leaner meats, and have plant based proteins. Consider reducing your intake of artificial sweeteners. If able, increase physical activity.   MONITORING & EVALUATION Dietary  intake, weekly physical activity,  in 6-8 weeks.  Next Steps  Patient is to call to make follow-up appointment. If wanting more specific information to pre-diabetes ask PCP to send over a new referal.

## 2021-01-02 NOTE — Patient Instructions (Signed)
If you want to try taking high dose fish oil (4 grams/day), check with your doctor or pharmacist to make sure it is not contraindicated with your other medications. Continue eating foods that can help reduce inflammation such as whole grains, fruits and vegetables. Continue choosing leaner meats, and have plant based proteins. Consider reducing your intake of artificial sweeteners. If able, increase physical activity.

## 2021-01-10 DIAGNOSIS — M961 Postlaminectomy syndrome, not elsewhere classified: Secondary | ICD-10-CM | POA: Diagnosis not present

## 2021-01-10 DIAGNOSIS — M47816 Spondylosis without myelopathy or radiculopathy, lumbar region: Secondary | ICD-10-CM | POA: Diagnosis not present

## 2021-01-10 DIAGNOSIS — Z79891 Long term (current) use of opiate analgesic: Secondary | ICD-10-CM | POA: Diagnosis not present

## 2021-01-18 DIAGNOSIS — H903 Sensorineural hearing loss, bilateral: Secondary | ICD-10-CM | POA: Diagnosis not present

## 2021-01-18 DIAGNOSIS — H6983 Other specified disorders of Eustachian tube, bilateral: Secondary | ICD-10-CM | POA: Diagnosis not present

## 2021-01-18 DIAGNOSIS — H7201 Central perforation of tympanic membrane, right ear: Secondary | ICD-10-CM | POA: Diagnosis not present

## 2021-01-25 ENCOUNTER — Other Ambulatory Visit: Payer: Self-pay | Admitting: Otolaryngology

## 2021-01-25 DIAGNOSIS — H918X9 Other specified hearing loss, unspecified ear: Secondary | ICD-10-CM

## 2021-01-31 DIAGNOSIS — M5126 Other intervertebral disc displacement, lumbar region: Secondary | ICD-10-CM | POA: Diagnosis not present

## 2021-01-31 DIAGNOSIS — M48061 Spinal stenosis, lumbar region without neurogenic claudication: Secondary | ICD-10-CM | POA: Diagnosis not present

## 2021-01-31 DIAGNOSIS — M961 Postlaminectomy syndrome, not elsewhere classified: Secondary | ICD-10-CM | POA: Diagnosis not present

## 2021-01-31 DIAGNOSIS — Z981 Arthrodesis status: Secondary | ICD-10-CM | POA: Diagnosis not present

## 2021-02-02 DIAGNOSIS — H903 Sensorineural hearing loss, bilateral: Secondary | ICD-10-CM | POA: Diagnosis not present

## 2021-02-16 ENCOUNTER — Ambulatory Visit
Admission: RE | Admit: 2021-02-16 | Discharge: 2021-02-16 | Disposition: A | Discharge status: Home / Self Care | Payer: BC Managed Care – PPO | Source: Ambulatory Visit | Attending: Otolaryngology | Admitting: Otolaryngology

## 2021-02-16 DIAGNOSIS — H918X9 Other specified hearing loss, unspecified ear: Secondary | ICD-10-CM

## 2021-02-16 DIAGNOSIS — H903 Sensorineural hearing loss, bilateral: Secondary | ICD-10-CM | POA: Diagnosis not present

## 2021-02-16 DIAGNOSIS — J3489 Other specified disorders of nose and nasal sinuses: Secondary | ICD-10-CM | POA: Diagnosis not present

## 2021-02-16 MED ORDER — GADOBENATE DIMEGLUMINE 529 MG/ML IV SOLN
20.0000 mL | Freq: Once | INTRAVENOUS | Status: AC | PRN
  Administered 2021-02-16: 20 mL via INTRAVENOUS

## 2021-03-28 DIAGNOSIS — Z79891 Long term (current) use of opiate analgesic: Secondary | ICD-10-CM | POA: Diagnosis not present

## 2021-03-28 DIAGNOSIS — F1120 Opioid dependence, uncomplicated: Secondary | ICD-10-CM | POA: Diagnosis not present

## 2021-03-28 DIAGNOSIS — M5416 Radiculopathy, lumbar region: Secondary | ICD-10-CM | POA: Diagnosis not present

## 2021-03-28 DIAGNOSIS — M961 Postlaminectomy syndrome, not elsewhere classified: Secondary | ICD-10-CM | POA: Diagnosis not present

## 2021-04-25 DIAGNOSIS — M5416 Radiculopathy, lumbar region: Secondary | ICD-10-CM | POA: Diagnosis not present

## 2021-05-15 DIAGNOSIS — M961 Postlaminectomy syndrome, not elsewhere classified: Secondary | ICD-10-CM | POA: Diagnosis not present

## 2021-05-15 DIAGNOSIS — M5136 Other intervertebral disc degeneration, lumbar region: Secondary | ICD-10-CM | POA: Diagnosis not present

## 2021-05-15 DIAGNOSIS — M47816 Spondylosis without myelopathy or radiculopathy, lumbar region: Secondary | ICD-10-CM | POA: Diagnosis not present

## 2021-05-16 DIAGNOSIS — M5416 Radiculopathy, lumbar region: Secondary | ICD-10-CM | POA: Diagnosis not present

## 2021-05-16 DIAGNOSIS — M961 Postlaminectomy syndrome, not elsewhere classified: Secondary | ICD-10-CM | POA: Diagnosis not present

## 2021-05-16 DIAGNOSIS — F1120 Opioid dependence, uncomplicated: Secondary | ICD-10-CM | POA: Diagnosis not present

## 2021-06-07 DIAGNOSIS — E119 Type 2 diabetes mellitus without complications: Secondary | ICD-10-CM | POA: Insufficient documentation

## 2021-06-07 DIAGNOSIS — M961 Postlaminectomy syndrome, not elsewhere classified: Secondary | ICD-10-CM | POA: Diagnosis not present

## 2021-06-07 DIAGNOSIS — M4726 Other spondylosis with radiculopathy, lumbar region: Secondary | ICD-10-CM | POA: Diagnosis not present

## 2021-06-07 DIAGNOSIS — G894 Chronic pain syndrome: Secondary | ICD-10-CM | POA: Diagnosis not present

## 2021-06-07 DIAGNOSIS — M791 Myalgia, unspecified site: Secondary | ICD-10-CM | POA: Insufficient documentation

## 2021-07-19 DIAGNOSIS — H7201 Central perforation of tympanic membrane, right ear: Secondary | ICD-10-CM | POA: Diagnosis not present

## 2021-07-19 DIAGNOSIS — H903 Sensorineural hearing loss, bilateral: Secondary | ICD-10-CM | POA: Diagnosis not present

## 2021-07-19 DIAGNOSIS — H6981 Other specified disorders of Eustachian tube, right ear: Secondary | ICD-10-CM | POA: Diagnosis not present

## 2021-09-01 DIAGNOSIS — M961 Postlaminectomy syndrome, not elsewhere classified: Secondary | ICD-10-CM | POA: Diagnosis not present

## 2021-09-01 DIAGNOSIS — M4726 Other spondylosis with radiculopathy, lumbar region: Secondary | ICD-10-CM | POA: Diagnosis not present

## 2021-09-01 DIAGNOSIS — F1120 Opioid dependence, uncomplicated: Secondary | ICD-10-CM | POA: Diagnosis not present

## 2021-09-21 DIAGNOSIS — M961 Postlaminectomy syndrome, not elsewhere classified: Secondary | ICD-10-CM | POA: Diagnosis not present

## 2021-10-02 DIAGNOSIS — M545 Low back pain, unspecified: Secondary | ICD-10-CM | POA: Diagnosis not present

## 2021-10-02 DIAGNOSIS — Z79899 Other long term (current) drug therapy: Secondary | ICD-10-CM | POA: Diagnosis not present

## 2021-10-02 DIAGNOSIS — M542 Cervicalgia: Secondary | ICD-10-CM | POA: Diagnosis not present

## 2021-10-02 DIAGNOSIS — M546 Pain in thoracic spine: Secondary | ICD-10-CM | POA: Diagnosis not present

## 2021-10-05 DIAGNOSIS — Z79899 Other long term (current) drug therapy: Secondary | ICD-10-CM | POA: Diagnosis not present

## 2021-10-27 ENCOUNTER — Encounter: Payer: Self-pay | Admitting: Internal Medicine

## 2021-10-27 MED ORDER — ATORVASTATIN CALCIUM 40 MG PO TABS: 40.0000 mg | ORAL_TABLET | Freq: Every day | ORAL | 0 refills | Status: DC

## 2021-10-27 MED ORDER — FENOFIBRATE 145 MG PO TABS: 145.0000 mg | ORAL_TABLET | Freq: Every day | ORAL | 0 refills | Status: AC

## 2021-10-30 DIAGNOSIS — M546 Pain in thoracic spine: Secondary | ICD-10-CM | POA: Diagnosis not present

## 2021-10-30 DIAGNOSIS — M542 Cervicalgia: Secondary | ICD-10-CM | POA: Diagnosis not present

## 2021-10-30 DIAGNOSIS — Z79899 Other long term (current) drug therapy: Secondary | ICD-10-CM | POA: Diagnosis not present

## 2021-10-30 DIAGNOSIS — M545 Low back pain, unspecified: Secondary | ICD-10-CM | POA: Diagnosis not present

## 2021-11-02 DIAGNOSIS — Z79899 Other long term (current) drug therapy: Secondary | ICD-10-CM | POA: Diagnosis not present

## 2021-11-30 DIAGNOSIS — M546 Pain in thoracic spine: Secondary | ICD-10-CM | POA: Diagnosis not present

## 2021-11-30 DIAGNOSIS — Z79899 Other long term (current) drug therapy: Secondary | ICD-10-CM | POA: Diagnosis not present

## 2021-11-30 DIAGNOSIS — M545 Low back pain, unspecified: Secondary | ICD-10-CM | POA: Diagnosis not present

## 2021-11-30 DIAGNOSIS — M542 Cervicalgia: Secondary | ICD-10-CM | POA: Diagnosis not present

## 2021-12-04 DIAGNOSIS — Z79899 Other long term (current) drug therapy: Secondary | ICD-10-CM | POA: Diagnosis not present

## 2021-12-05 DIAGNOSIS — H6981 Other specified disorders of Eustachian tube, right ear: Secondary | ICD-10-CM | POA: Diagnosis not present

## 2021-12-05 DIAGNOSIS — H7201 Central perforation of tympanic membrane, right ear: Secondary | ICD-10-CM | POA: Diagnosis not present

## 2021-12-05 DIAGNOSIS — H903 Sensorineural hearing loss, bilateral: Secondary | ICD-10-CM | POA: Diagnosis not present

## 2021-12-05 DIAGNOSIS — H608X1 Other otitis externa, right ear: Secondary | ICD-10-CM | POA: Diagnosis not present

## 2021-12-07 DIAGNOSIS — Z0000 Encounter for general adult medical examination without abnormal findings: Secondary | ICD-10-CM | POA: Diagnosis not present

## 2021-12-07 DIAGNOSIS — R739 Hyperglycemia, unspecified: Secondary | ICD-10-CM | POA: Diagnosis not present

## 2021-12-07 DIAGNOSIS — Z125 Encounter for screening for malignant neoplasm of prostate: Secondary | ICD-10-CM | POA: Diagnosis not present

## 2021-12-07 DIAGNOSIS — E291 Testicular hypofunction: Secondary | ICD-10-CM | POA: Diagnosis not present

## 2021-12-07 DIAGNOSIS — E7800 Pure hypercholesterolemia, unspecified: Secondary | ICD-10-CM | POA: Diagnosis not present

## 2021-12-14 DIAGNOSIS — E119 Type 2 diabetes mellitus without complications: Secondary | ICD-10-CM | POA: Diagnosis not present

## 2021-12-14 DIAGNOSIS — R82998 Other abnormal findings in urine: Secondary | ICD-10-CM | POA: Diagnosis not present

## 2021-12-14 DIAGNOSIS — Z0000 Encounter for general adult medical examination without abnormal findings: Secondary | ICD-10-CM | POA: Diagnosis not present

## 2021-12-14 DIAGNOSIS — Z1331 Encounter for screening for depression: Secondary | ICD-10-CM | POA: Diagnosis not present

## 2021-12-14 DIAGNOSIS — Z1339 Encounter for screening examination for other mental health and behavioral disorders: Secondary | ICD-10-CM | POA: Diagnosis not present

## 2021-12-28 DIAGNOSIS — M545 Low back pain, unspecified: Secondary | ICD-10-CM | POA: Diagnosis not present

## 2021-12-28 DIAGNOSIS — M542 Cervicalgia: Secondary | ICD-10-CM | POA: Diagnosis not present

## 2021-12-28 DIAGNOSIS — Z79899 Other long term (current) drug therapy: Secondary | ICD-10-CM | POA: Diagnosis not present

## 2021-12-28 DIAGNOSIS — M546 Pain in thoracic spine: Secondary | ICD-10-CM | POA: Diagnosis not present

## 2021-12-31 NOTE — Progress Notes (Unsigned)
 Cardiology Office Note   Date:  01/01/2022   ID:  Luke Wong, DOB 10/30/1970, MRN [id number]  PCP:  Tisovec, Richard W, MD  Cardiologist:   Paula Ross, MD    F/U of hyperlipidemia and BP       History of Present Illness: Luke Wong is a 51 y.o. male with a history of hyperlipidemia.  The pt also has a very strong family history of CAD (no male lived after 64 due to heart problems)  Note that CT Ca score was 0 H  )In addition to Ca score the pt has had stress tests  Last Lexiscan myovue for CP was in 2017  This showed a small, fixed defect that was probable artifact   LVEF 54%.  P Th pt also has a hx of reflux and DJD  I saw the pt in May 2022  In June he had a Ca score CT that was 23 (80th percentile for age)     Fatty liver changes noted   Lipds   LDL 84 with 1378 partilces  Trgi 368  HDL 30   Lpa 10 ApoB 88    Since seen the pt says he still has intermitt episodes of CP  Last 5 to 39 min   Takes ASA     Not associated with physical activity   ? Mental activity.    Frightening to him   Not sure what it is    Continues to have problems with back   Limits activity  Outpatient Medications Prior to Visit  Medication Sig Dispense Refill   acetaminophen (TYLENOL) 325 MG tablet Take 2 tablets (650 mg total) by mouth every 4 (four) hours as needed for mild pain ((score 1 to 3) or temp > 100.5).     aspirin 81 MG tablet Take 81 mg by mouth daily.     atorvastatin (LIPITOR) 40 MG tablet Take 1 tablet (40 mg total) by mouth daily. 90 tablet 0   BELBUCA 300 MCG FILM Take 450 mg by mouth in the morning and at bedtime.     DULoxetine (CYMBALTA) 60 MG capsule Take 60 mg by mouth daily.      empagliflozin (JARDIANCE) 25 MG TABS tablet Take by mouth daily.     famotidine (PEPCID) 20 MG tablet Take 20 mg by mouth at bedtime.      fenofibrate (TRICOR) 145 MG tablet Take 1 tablet (145 mg total) by mouth daily. 90 tablet 0   fexofenadine (ALLEGRA) 180 MG tablet Take 180 mg by mouth daily.      methocarbamol (ROBAXIN) 500 MG tablet Take 1 tablet (500 mg total) by mouth every 6 (six) hours as needed for muscle spasms. 28 tablet 1   nortriptyline (PAMELOR) 25 MG capsule Take 25 mg by mouth at bedtime.      oxyCODONE 10 MG TABS Take 1 tablet (10 mg total) by mouth every 4 (four) hours as needed for severe pain ((score 7 to 10)). 42 tablet 0   traZODone (DESYREL) 100 MG tablet Take 100 mg by mouth at bedtime.   5   albuterol (PROAIR HFA) 108 (90 Base) MCG/ACT inhaler 2 puffs every 4 hours as needed only  if your can't catch your breath (Patient not taking: Reported on 01/01/2022) 1 Inhaler 0   azelastine (ASTELIN) 0.1 % nasal spray Place 1 spray into both nostrils 2 (two) times daily. Use in each nostril as directed (Patient not taking: Reported on 01/01/2022) 30 mL 0     budesonide-formoterol (SYMBICORT) 160-4.5 MCG/ACT inhaler Inhale 2 puffs into the lungs daily. (Patient not taking: Reported on 01/01/2022) 1 Inhaler 3   HYDROcodone bit-homatropine (HYCODAN) 5-1.5 MG/5ML syrup Take 5 mLs by mouth every 6 (six) hours as needed for cough. (Patient not taking: Reported on 01/01/2022) 120 mL 0   nitroGLYCERIN (NITROSTAT) 0.4 MG SL tablet Place 1 tablet (0.4 mg total) under the tongue every 5 (five) minutes as needed for chest pain. (Patient not taking: Reported on 01/01/2022) 25 tablet 3   predniSONE (DELTASONE) 10 MG tablet Take 4 tabs po daily x 3 days; then 2 tabs daily x3 days; then 1 tab daily x 3 days; then stop (Patient not taking: Reported on 01/01/2022) 21 tablet 0   predniSONE (DELTASONE) 10 MG tablet Take 4 tabs x1 day, 3 tabs x1 day, 2 tabs x1 day, 1 tab x1 day and then 1/2 tab x1 day. (Patient not taking: Reported on 01/01/2022) 11 tablet 0   No facility-administered medications prior to visit.     Allergies:   Meperidine   Past Medical History:  Diagnosis Date   Asthma    Chronic back pain    Depression    Essential (primary) hypertension    GERD (gastroesophageal reflux disease)     Hyperlipidemia    Pneumonia 2015   Post laminectomy syndrome     Past Surgical History:  Procedure Laterality Date   ABDOMINAL EXPOSURE N/A 03/27/2019   Procedure: ABDOMINAL EXPOSURE;  Surgeon: Marty Heck, MD;  Location: [facility] OR;  Service: Vascular;  Laterality: N/A;  anterior   ANKLE SURGERY Left 06/07/13, 3/16   Arthroscopic debridement  x2   ANTERIOR LUMBAR FUSION N/A 03/27/2019   Procedure: ANTERIOR LUMBAR INTERBODY FUSION LUMBAR FIVE-SACRAL- ONE.;  Surgeon: Eustace Moore, MD;  Location: [place];  Service: Neurosurgery;  Laterality: N/A;  ANTERIOR APPROACH   BACK SURGERY     HERNIA REPAIR Bilateral 1994   LUMBAR LAMINECTOMY/ DECOMPRESSION WITH MET-RX Right 11/30/2014   Procedure: Right L4-5 Far Lateral Microdiskectomy w/ Metrx;  Surgeon: Kevan Ny Ditty, MD;  Location: [place] [facility];  Service: Neurosurgery;  Laterality: Right;  Right L4-5 Far Lateral Microdiskectomy   MYRINGOTOMY WITH TUBE PLACEMENT Right 10/28/2018   Procedure: RIGHT MYRINGOTOMY WITH  T TUBE PLACEMENT;  Surgeon: Leta Baptist, MD;  Location: [place];  Service: ENT;  Laterality: Right;   SPINAL FUSION       Social History:  The patient  reports that he has never smoked. He has never used smokeless tobacco. He reports current alcohol use. He reports that he does not use drugs.   Family History:  The patient's family history includes Heart attack in his father and paternal grandfather; Heart disease in his father; Thyroid cancer in his mother.    ROS:  Please see the history of present illness. All other systems are reviewed and  Negative to the above problem except as noted.    PHYSICAL EXAM: VS:  BP 130/86   Pulse 95   Ht 6' (1.829 m)   Wt 235 lb 9.6 oz (106.9 kg)   SpO2 97%   BMI 31.95 kg/m   GEN:  Obese 51 yo in NAD    Neck: JVP is normal   No carotid bruits Cardiac: RRR; no murmurs  No LE  edema  Respiratory:  clear to auscultation bilaterally GI: soft, nontender, nondistended, + BS   No hepatomegaly  MS: no deformity Moving all extremities   Skin: warm and dry,  no rash Neuro:  Strength and sensation are intact Psych: euthymic mood, full affect   EKG:  EKG shows SR 95 bpm      Lipid Panel    Component Value Date/Time   CHOL 165 01/18/2017 1658   TRIG 572 (HH) 01/18/2017 1658   HDL 33 (L) 01/18/2017 1658   CHOLHDL 5.0 01/18/2017 1658   CHOLHDL 7.0 (H) 11/25/2015 0917   VLDL 47 (H) 11/25/2015 0917   LDLCALC Comment 01/18/2017 1658   LDLDIRECT 113.0 09/16/2014 0814      Wt Readings from Last 3 Encounters:  01/01/22 235 lb 9.6 oz (106.9 kg)  09/09/20 248 lb (112.5 kg)  08/23/20 243 lb 3.2 oz (110.3 kg)      ASSESSMENT AND PLAN:  1  CP   Pt with low Ca score   Intermitt episodes of CP   Pt very concerned with FHx   Not clearly relfux    I would recomm coronary CT angiogram to define anatomy           2  HL  Last lipids showed LDL 77  HDL 21  Trig 235   This was 1 year ago   Repeat LDL from Dr Tisovec close to 100   WIll repeat fasting lipomed  Limit carbs   3  HTN   BP is controlled  Continue to follow   F/U based on trest results   Signed, Paula Ross, MD  01/01/2022 11:37 AM    [facility] [address], [place], [place]  [address] Phone: [phone]; Fax: [phone]    

## 2022-01-01 ENCOUNTER — Encounter: Payer: Self-pay | Admitting: Internal Medicine

## 2022-01-01 ENCOUNTER — Ambulatory Visit: Payer: BC Managed Care – PPO | Attending: Internal Medicine | Admitting: Internal Medicine

## 2022-01-01 VITALS — BP 130/86 | HR 95 | Ht 72.0 in | Wt 235.6 lb

## 2022-01-01 DIAGNOSIS — Z01810 Encounter for preprocedural cardiovascular examination: Secondary | ICD-10-CM

## 2022-01-01 DIAGNOSIS — R931 Abnormal findings on diagnostic imaging of heart and coronary circulation: Secondary | ICD-10-CM

## 2022-01-01 DIAGNOSIS — Z79899 Other long term (current) drug therapy: Secondary | ICD-10-CM | POA: Diagnosis not present

## 2022-01-01 DIAGNOSIS — E782 Mixed hyperlipidemia: Secondary | ICD-10-CM

## 2022-01-01 MED ORDER — METOPROLOL TARTRATE 100 MG PO TABS: ORAL_TABLET | ORAL | 0 refills | Status: DC

## 2022-01-01 NOTE — Patient Instructions (Addendum)
Medication Instructions:   *If you need a refill on your cardiac medications before your next appointment, please call your pharmacy*   Lab Work: NMR AND BMET FRIDAY 01/05/22... anytime after 7:30 am fasting  If you have labs (blood work) drawn today and your tests are completely normal, you will receive your results only by: MyChart Message (if you have MyChart) OR A paper copy in the mail If you have any lab test that is abnormal or we need to change your treatment, we will call you to review the results.   Testing/Procedures:   Your cardiac CT will be scheduled at one of the below locations:   [facility] [address] [place], [place] [address] [phone]  OR  [facility] [address] [place], [place] [address] [phone]  OR   [facility] [address] [place], [place] [address] [phone]  If scheduled at [facility], please arrive at the [facility] and Children's Entrance (Entrance C2) of [facility] 30 minutes prior to test start time. You can use the FREE valet parking offered at entrance C (encouraged to control the heart rate for the test)  Proceed to the [facility] Radiology Department (first floor) to check-in and test prep.  All radiology patients and guests should use entrance C2 at [facility], accessed from [facility], even though the hospital's physical address listed is [address]    If scheduled at [facility] or [facility], please arrive 15 mins early for check-in and test prep.   Please follow these instructions carefully (unless otherwise directed):  Hold all erectile dysfunction medications at least 3 days (72 hrs) prior to test. (Ie viagra, cialis, sildenafil, tadalafil, etc) We will administer nitroglycerin during this exam.   On the Night Before the  Test: Be sure to Drink plenty of water. Do not consume any caffeinated/decaffeinated beverages or chocolate 12 hours prior to your test. Do not take any antihistamines 12 hours prior to your test.  On the Day of the Test: Drink plenty of water until 1 hour prior to the test. You may take your regular medications prior to the test.  Take metoprolol (Lopressor) two hours prior to test. SENT TO YOUR PHARMACY.        After the Test: Drink plenty of water. After receiving IV contrast, you may experience a mild flushed feeling. This is normal. On occasion, you may experience a mild rash up to 24 hours after the test. This is not dangerous. If this occurs, you can take Benadryl 25 mg and increase your fluid intake. If you experience trouble breathing, this can be serious. If it is severe call 911 IMMEDIATELY. If it is mild, please call our office.   We will call to schedule your test 2-4 weeks out understanding that some insurance companies will need an authorization prior to the service being performed.   For non-scheduling related questions, please contact the cardiac imaging nurse navigator should you have any questions/concerns: Rockwell Alexandria, Cardiac Imaging Nurse Navigator Larey Brick, Cardiac Imaging Nurse Navigator [facility] Direct Office Dial: [phone]   For scheduling needs, including cancellations and rescheduling, please call [place], [phone].    Follow-Up: At [facility], you and your health needs are our priority.  As part of our continuing mission to provide you with exceptional heart care, we have created designated Provider Care  Teams.  These Care Teams include your primary Cardiologist (physician) and Advanced Practice Providers (APPs -  Physician Assistants and Nurse Practitioners) who all work together to provide you with the care you need, when you need it.  We recommend signing up for the patient portal called  "MyChart".  Sign up information is provided on this After Visit Summary.  MyChart is used to connect with patients for Virtual Visits (Telemedicine).  Patients are able to view lab/test results, encounter notes, upcoming appointments, etc.  Non-urgent messages can be sent to your provider as well.   To learn more about what you can do with MyChart, go to [email or web address].     Important Information About Sugar

## 2022-01-05 ENCOUNTER — Ambulatory Visit: Payer: BC Managed Care – PPO | Attending: Internal Medicine

## 2022-01-05 DIAGNOSIS — R931 Abnormal findings on diagnostic imaging of heart and coronary circulation: Secondary | ICD-10-CM

## 2022-01-05 DIAGNOSIS — Z79899 Other long term (current) drug therapy: Secondary | ICD-10-CM

## 2022-01-05 DIAGNOSIS — Z01810 Encounter for preprocedural cardiovascular examination: Secondary | ICD-10-CM

## 2022-01-05 DIAGNOSIS — E782 Mixed hyperlipidemia: Secondary | ICD-10-CM

## 2022-01-05 LAB — NO SPECIMEN RECEIVED

## 2022-01-06 LAB — BASIC METABOLIC PANEL
BUN/Creatinine Ratio: 13 (ref 9–20)
BUN: 15 mg/dL (ref 6–24)
CO2: 26 mmol/L (ref 20–29)
Calcium: 9.5 mg/dL (ref 8.7–10.2)
Chloride: 98 mmol/L (ref 96–106)
Creatinine, Ser: 1.17 mg/dL (ref 0.76–1.27)
Glucose: 112 mg/dL — ABNORMAL HIGH (ref 70–99)
Potassium: 4.1 mmol/L (ref 3.5–5.2)
Sodium: 138 mmol/L (ref 134–144)
eGFR: 76 mL/min/{1.73_m2} (ref >59)

## 2022-01-06 LAB — NMR, LIPOPROFILE
Cholesterol, Total: 154 mg/dL (ref 100–199)
HDL Particle Number: 25.6 umol/L — ABNORMAL LOW (ref >=30.5)
HDL-C: 26 mg/dL — ABNORMAL LOW (ref >39)
LDL Particle Number: 1303 nmol/L — ABNORMAL HIGH (ref <1000)
LDL Size: 20.8 nm (ref >20.5)
LDL-C (NIH Calc): 82 mg/dL (ref 0–99)
LP-IR Score: 81 — ABNORMAL HIGH (ref <=45)
Small LDL Particle Number: 634 nmol/L — ABNORMAL HIGH (ref <=527)
Triglycerides: 280 mg/dL — ABNORMAL HIGH (ref 0–149)

## 2022-01-12 ENCOUNTER — Telehealth: Payer: Self-pay

## 2022-01-12 DIAGNOSIS — Z79899 Other long term (current) drug therapy: Secondary | ICD-10-CM

## 2022-01-12 DIAGNOSIS — E782 Mixed hyperlipidemia: Secondary | ICD-10-CM

## 2022-01-12 DIAGNOSIS — R931 Abnormal findings on diagnostic imaging of heart and coronary circulation: Secondary | ICD-10-CM

## 2022-01-12 NOTE — Telephone Encounter (Signed)
-----   Message from Dorris Carnes V, MD sent at 01/12/2022  3:27 PM EDT ----- Electrolytes and kidney function are OK except for glucose whiich is a little high  Lipids:   LDL 82   HDL 26  Triglycerides 280    Still not optimal     I have reviewed with pharmacy    Would recom 1.  Repatha 2x per month 2   Check on referral to nutritionist     3  Follow up lipomed and A1C in 8 to 12 wks

## 2022-01-12 NOTE — Telephone Encounter (Signed)
I spoke with the pt re: his labs and he agreed to try the [place].. he says he would rather come in to talk to the Pharmacist about t prio to starting.. he declines a nutritionist after I went over the benefits but he says he has done that and will not go back... he will work on his diet on his own.   I will place referral to PharmD and will schedule labs after he gets started on the [place].

## 2022-01-17 ENCOUNTER — Telehealth (HOSPITAL_COMMUNITY): Payer: Self-pay | Admitting: Emergency Medicine

## 2022-01-17 NOTE — Telephone Encounter (Signed)
Reaching out to patient to offer assistance regarding upcoming cardiac imaging study; pt verbalizes understanding of appt date/time, parking situation and where to check in, pre-test NPO status and medications ordered, and verified current allergies; name and call back number provided for further questions should they arise Marchia Bond RN Navigator Cardiac Imaging [facility] [phone] office [phone] cell  Arrival 1130 Denies iv issues  HOH 100mg  metoprolol Aware nitro

## 2022-01-19 ENCOUNTER — Ambulatory Visit (HOSPITAL_COMMUNITY)
Admission: RE | Admit: 2022-01-19 | Discharge: 2022-01-19 | Disposition: A | Discharge status: Home / Self Care | Payer: BC Managed Care – PPO | Source: Ambulatory Visit | Attending: Internal Medicine | Admitting: Internal Medicine

## 2022-01-19 DIAGNOSIS — Z8249 Family history of ischemic heart disease and other diseases of the circulatory system: Secondary | ICD-10-CM | POA: Diagnosis not present

## 2022-01-19 DIAGNOSIS — R079 Chest pain, unspecified: Secondary | ICD-10-CM | POA: Diagnosis not present

## 2022-01-19 DIAGNOSIS — E782 Mixed hyperlipidemia: Secondary | ICD-10-CM | POA: Diagnosis not present

## 2022-01-19 DIAGNOSIS — Z01810 Encounter for preprocedural cardiovascular examination: Secondary | ICD-10-CM | POA: Insufficient documentation

## 2022-01-19 DIAGNOSIS — R931 Abnormal findings on diagnostic imaging of heart and coronary circulation: Secondary | ICD-10-CM | POA: Insufficient documentation

## 2022-01-19 DIAGNOSIS — Z79899 Other long term (current) drug therapy: Secondary | ICD-10-CM | POA: Diagnosis present

## 2022-01-19 MED ORDER — NITROGLYCERIN 0.4 MG SL SUBL
0.8000 mg | SUBLINGUAL_TABLET | Freq: Once | SUBLINGUAL | Status: AC
  Administered 2022-01-19: 0.8 mg via SUBLINGUAL

## 2022-01-19 MED ORDER — IOHEXOL 350 MG/ML SOLN
95.0000 mL | Freq: Once | INTRAVENOUS | Status: AC | PRN
  Administered 2022-01-19: 95 mL via INTRAVENOUS

## 2022-01-19 MED ORDER — NITROGLYCERIN 0.4 MG SL SUBL
SUBLINGUAL_TABLET | SUBLINGUAL | Status: AC
  Filled 2022-01-19: qty 2

## 2022-01-26 DIAGNOSIS — R030 Elevated blood-pressure reading, without diagnosis of hypertension: Secondary | ICD-10-CM | POA: Diagnosis not present

## 2022-01-26 DIAGNOSIS — Z6831 Body mass index (BMI) 31.0-31.9, adult: Secondary | ICD-10-CM | POA: Diagnosis not present

## 2022-01-26 DIAGNOSIS — M545 Low back pain, unspecified: Secondary | ICD-10-CM | POA: Diagnosis not present

## 2022-01-26 DIAGNOSIS — Z79899 Other long term (current) drug therapy: Secondary | ICD-10-CM | POA: Diagnosis not present

## 2022-02-01 DIAGNOSIS — Z79899 Other long term (current) drug therapy: Secondary | ICD-10-CM | POA: Diagnosis not present

## 2022-02-20 DIAGNOSIS — Z6831 Body mass index (BMI) 31.0-31.9, adult: Secondary | ICD-10-CM | POA: Diagnosis not present

## 2022-02-20 DIAGNOSIS — M545 Low back pain, unspecified: Secondary | ICD-10-CM | POA: Diagnosis not present

## 2022-02-20 DIAGNOSIS — M546 Pain in thoracic spine: Secondary | ICD-10-CM | POA: Diagnosis not present

## 2022-02-20 DIAGNOSIS — Z79899 Other long term (current) drug therapy: Secondary | ICD-10-CM | POA: Diagnosis not present

## 2022-02-22 DIAGNOSIS — Z79899 Other long term (current) drug therapy: Secondary | ICD-10-CM | POA: Diagnosis not present

## 2022-02-28 ENCOUNTER — Ambulatory Visit: Payer: BC Managed Care – PPO | Attending: Cardiology | Admitting: Pharmacist

## 2022-02-28 ENCOUNTER — Telehealth: Payer: Self-pay | Admitting: Pharmacist

## 2022-02-28 ENCOUNTER — Encounter: Payer: Self-pay | Admitting: Pharmacist

## 2022-02-28 VITALS — Wt 234.0 lb

## 2022-02-28 DIAGNOSIS — I25119 Atherosclerotic heart disease of native coronary artery with unspecified angina pectoris: Secondary | ICD-10-CM | POA: Diagnosis not present

## 2022-02-28 DIAGNOSIS — E782 Mixed hyperlipidemia: Secondary | ICD-10-CM

## 2022-02-28 DIAGNOSIS — R931 Abnormal findings on diagnostic imaging of heart and coronary circulation: Secondary | ICD-10-CM | POA: Diagnosis not present

## 2022-02-28 NOTE — Telephone Encounter (Signed)
PA submitted for Repatha. Key: [id number].  PA submitted for Vascepa. Key: [id number]

## 2022-02-28 NOTE — Progress Notes (Signed)
Patient ID: Luke Wong                 DOB: 10-29-1970                    MRN: [id number]      HPI: Luke Wong is a 51 y.o. male patient referred to lipid clinic by Dr. Tenny Craw. PMH is significant for CAD, HTN, T2DM?, obesity and elevated coronary calcium score. Patient has extensive family history of CAD.  Patient presents today concerned about his health. Father, grandfather, and great uncle all had CAD and passed away. He has 2 sons and he is concerned for their health as well.  Currently managed on atorvastatin 40mg  daily and fenofibrate 160mg  daily with no adverse effects  Current Medications:  Atorvastatin 40mg  daily  Intolerances: N/A  Risk Factors:  CAD Elevated coronary calcium score Family History  LDL goal: <55  Labs: LDL particle # 1303, HDL 26, Trigs 280, TC 154, HDL particle # 25.6, LDL 82  Coronary artery calcium score 76.7 Agatston units. This places the patient in the 88th percentile for age and gender, suggesting high risk for future cardiac events.   Mild nonobstructive CAD.  Past Medical History:  Diagnosis Date   Asthma    Chronic back pain    Depression    Essential (primary) hypertension    GERD (gastroesophageal reflux disease)    Hyperlipidemia    Pneumonia 2015   Post laminectomy syndrome     Current Outpatient Medications on File Prior to Visit  Medication Sig Dispense Refill   acetaminophen (TYLENOL) 325 MG tablet Take 2 tablets (650 mg total) by mouth every 4 (four) hours as needed for mild pain ((score 1 to 3) or temp > 100.5).     aspirin 81 MG tablet Take 81 mg by mouth daily.     atorvastatin (LIPITOR) 40 MG tablet Take 1 tablet (40 mg total) by mouth daily. 90 tablet 0   BELBUCA 300 MCG FILM Take 450 mg by mouth in the morning and at bedtime.     DULoxetine (CYMBALTA) 60 MG capsule Take 60 mg by mouth daily.      empagliflozin (JARDIANCE) 25 MG TABS tablet Take by mouth daily.     famotidine (PEPCID) 20 MG tablet Take 20 mg by mouth at  bedtime.      fenofibrate (TRICOR) 145 MG tablet Take 1 tablet (145 mg total) by mouth daily. 90 tablet 0   fexofenadine (ALLEGRA) 180 MG tablet Take 180 mg by mouth daily.     methocarbamol (ROBAXIN) 500 MG tablet Take 1 tablet (500 mg total) by mouth every 6 (six) hours as needed for muscle spasms. 28 tablet 1   metoprolol tartrate (LOPRESSOR) 100 MG tablet Take one tablet (100 mg) by mouth 2 hours prior to your Cardiac CT. 1 tablet 0   nortriptyline (PAMELOR) 25 MG capsule Take 25 mg by mouth at bedtime.      oxyCODONE 10 MG TABS Take 1 tablet (10 mg total) by mouth every 4 (four) hours as needed for severe pain ((score 7 to 10)). 42 tablet 0   traZODone (DESYREL) 100 MG tablet Take 100 mg by mouth at bedtime.   5   No current facility-administered medications on file prior to visit.    Allergies  Allergen Reactions   Meperidine Other (See Comments)    Unknown reaction type Pt unsure Unknown reaction type    Assessment/Plan:  1. Hyperlipidemia -  Patient LDL 82 which is above goal of <55. Aggressive goal chosen due to patients risk factor and patient's desire to be aggressive in lipid lowering.  Will start Repatha.  Using demo pen, educated patient on mechanism of action, storage, site selection , administration, and possible adverse effects. Patient voiced understanding. Will complete PA and contact patient when approved.    Patient triglycerides 280 which is above goal of <150. Will start Vascepa 2g BID.  Continue atorvastatin 40mg  daily Continue fenofibrate 160mg  daily Start Repatha 140mg  sq q 14 days Start Vascepa 2g BID Recheck lipid panel in 2-3 months  , PharmD, BCACP, CDCES, CPP [facility] [address] [place], [facility] [address] Phone: [phone]; Fax: [phone] 02/28/2022 5:10 PM

## 2022-02-28 NOTE — Patient Instructions (Addendum)
It was nice meeting you today  We would like your LDL (bad cholesterol) to be less than 55 and your triglycerides less than 150  Please continue your atorvastatin 40mg  and fenofibrate 150mg  once a day  We would like to start a new medication called Repatha or Praluent, both of which are given once every 2 weeks  The websites for the copay cards are www.repatha.com and www.praluent.com  For your triglycerides, we would like to start Vascepa. Your dose will be 2 capsules twice a day with food  We will recheck your cholesterol in 2-3 months  I will let you know when your prior authorizations are approved  Please message me with any questions  , PharmD, BCACP, CDCES, CPP [facility] [address], [place], [address] [place] Phone: [phone]; Fax: [phone] 02/28/2022 2:06 PM

## 2022-03-01 MED ORDER — ICOSAPENT ETHYL 1 G PO CAPS: ORAL_CAPSULE | ORAL | 3 refills | Status: DC

## 2022-03-01 MED ORDER — REPATHA SURECLICK 140 MG/ML ~~LOC~~ SOAJ: 1.0000 mL | SUBCUTANEOUS | 3 refills | Status: DC

## 2022-03-01 NOTE — Addendum Note (Signed)
Addended by: Cheree Ditto on: 03/01/2022 07:53 AM   Modules accepted: Orders

## 2022-03-01 NOTE — Telephone Encounter (Signed)
Repatha and Vascepa approved both approved until 03/01/23

## 2022-03-01 NOTE — Telephone Encounter (Signed)
REPATHA WAS APPROVALED FROM 02/28/22 TO 03/01/23 ID # [id number] CASE # [id number]

## 2022-03-23 DIAGNOSIS — M545 Low back pain, unspecified: Secondary | ICD-10-CM | POA: Diagnosis not present

## 2022-03-23 DIAGNOSIS — Z6831 Body mass index (BMI) 31.0-31.9, adult: Secondary | ICD-10-CM | POA: Diagnosis not present

## 2022-03-23 DIAGNOSIS — Z79899 Other long term (current) drug therapy: Secondary | ICD-10-CM | POA: Diagnosis not present

## 2022-03-23 DIAGNOSIS — R030 Elevated blood-pressure reading, without diagnosis of hypertension: Secondary | ICD-10-CM | POA: Diagnosis not present

## 2022-03-23 DIAGNOSIS — M546 Pain in thoracic spine: Secondary | ICD-10-CM | POA: Diagnosis not present

## 2022-03-29 DIAGNOSIS — Z79899 Other long term (current) drug therapy: Secondary | ICD-10-CM | POA: Diagnosis not present

## 2022-05-16 ENCOUNTER — Other Ambulatory Visit: Payer: Self-pay | Admitting: *Deleted

## 2022-05-16 ENCOUNTER — Ambulatory Visit: Payer: BC Managed Care – PPO | Attending: Internal Medicine

## 2022-05-16 DIAGNOSIS — E782 Mixed hyperlipidemia: Secondary | ICD-10-CM

## 2022-05-16 DIAGNOSIS — Z79899 Other long term (current) drug therapy: Secondary | ICD-10-CM

## 2022-05-16 DIAGNOSIS — I25119 Atherosclerotic heart disease of native coronary artery with unspecified angina pectoris: Secondary | ICD-10-CM

## 2022-05-17 LAB — LIPID PANEL
Chol/HDL Ratio: 2.9 ratio (ref 0.0–5.0)
Cholesterol, Total: 89 mg/dL — ABNORMAL LOW (ref 100–199)
HDL: 31 mg/dL — ABNORMAL LOW (ref >39)
LDL Chol Calc (NIH): 26 mg/dL (ref 0–99)
Triglycerides: 198 mg/dL — ABNORMAL HIGH (ref 0–149)
VLDL Cholesterol Cal: 32 mg/dL (ref 5–40)

## 2022-11-05 ENCOUNTER — Other Ambulatory Visit: Payer: Self-pay | Admitting: Internal Medicine

## 2022-11-05 DIAGNOSIS — I25119 Atherosclerotic heart disease of native coronary artery with unspecified angina pectoris: Secondary | ICD-10-CM

## 2022-11-05 DIAGNOSIS — R931 Abnormal findings on diagnostic imaging of heart and coronary circulation: Secondary | ICD-10-CM

## 2022-11-05 DIAGNOSIS — E782 Mixed hyperlipidemia: Secondary | ICD-10-CM

## 2023-01-22 ENCOUNTER — Other Ambulatory Visit (HOSPITAL_COMMUNITY): Payer: Self-pay

## 2023-01-22 ENCOUNTER — Telehealth: Payer: Self-pay | Admitting: Pharmacy Technician

## 2023-01-22 NOTE — Telephone Encounter (Signed)
Pharmacy Patient Advocate Encounter   Received notification from CoverMyMeds that prior authorization for repatha is required/requested.   Insurance verification completed.   The patient is insured through [facility]  .   Per test claim: PA required; PA submitted to [facility] via CoverMyMeds Key/confirmation #/EOC [id number] Status is pending

## 2023-01-23 ENCOUNTER — Other Ambulatory Visit (HOSPITAL_COMMUNITY): Payer: Self-pay

## 2023-01-23 NOTE — Telephone Encounter (Signed)
Pharmacy Patient Advocate Encounter  Received notification from [facility]  that Prior Authorization for repatha has been APPROVED from 12/23/22 to 01/22/24. Ran test claim, Copay is $0.00-no copay- 3 months. This test claim was processed through [facility]- copay amounts may vary at other pharmacies due to pharmacy/plan contracts, or as the patient moves through the different stages of their insurance plan.   PA #/Case ID/Reference #:  [id number]

## 2023-01-24 ENCOUNTER — Other Ambulatory Visit: Payer: Self-pay | Admitting: Internal Medicine

## 2023-01-24 DIAGNOSIS — I25119 Atherosclerotic heart disease of native coronary artery with unspecified angina pectoris: Secondary | ICD-10-CM

## 2023-01-24 DIAGNOSIS — E782 Mixed hyperlipidemia: Secondary | ICD-10-CM

## 2023-01-24 DIAGNOSIS — R931 Abnormal findings on diagnostic imaging of heart and coronary circulation: Secondary | ICD-10-CM

## 2023-02-13 ENCOUNTER — Encounter: Payer: Self-pay | Admitting: Gastroenterology

## 2023-02-26 ENCOUNTER — Other Ambulatory Visit (HOSPITAL_COMMUNITY): Payer: Self-pay

## 2023-03-01 ENCOUNTER — Telehealth (INDEPENDENT_AMBULATORY_CARE_PROVIDER_SITE_OTHER): Payer: Self-pay | Admitting: Otolaryngology

## 2023-03-01 NOTE — Telephone Encounter (Signed)
Ciprodex ear drops was called in [facility] on [address] in [place] with 5 refills.

## 2023-03-06 ENCOUNTER — Encounter: Payer: Self-pay | Admitting: Gastroenterology

## 2023-03-06 ENCOUNTER — Ambulatory Visit (AMBULATORY_SURGERY_CENTER): Payer: BC Managed Care – PPO

## 2023-03-06 VITALS — Ht 72.0 in | Wt 220.0 lb

## 2023-03-06 DIAGNOSIS — Z1211 Encounter for screening for malignant neoplasm of colon: Secondary | ICD-10-CM

## 2023-03-06 MED ORDER — NA SULFATE-K SULFATE-MG SULF 17.5-3.13-1.6 GM/177ML PO SOLN: 1.0000 | Freq: Once | ORAL | 0 refills | Status: AC

## 2023-03-06 NOTE — Progress Notes (Signed)
No egg or soy allergy known to patient  No issues known to pt with past sedation with any surgeries or procedures Patient denies ever being told they had issues or difficulty with intubation  No FH of Malignant Hyperthermia Pt is not on diet pills Pt is not on  home 02  Pt is not on blood thinners  Pt denies has with constipation  No A fib or A flutter Have any cardiac testing pending--no Pt can ambulate  Pt denies use of chewing tobacco Discussed diabetic I weight loss medication holds Discussed NSAID holds Checked BMI Pt instructed to use Singlecare.com or GoodRx for a price reduction on prep  Patient's chart reviewed by Cathlyn Parsons CNRA prior to previsit and patient appropriate for the [facility].  Pre visit completed and red dot placed by patient's name on their procedure day (on provider's schedule).

## 2023-03-08 ENCOUNTER — Encounter (INDEPENDENT_AMBULATORY_CARE_PROVIDER_SITE_OTHER): Payer: Self-pay

## 2023-03-08 ENCOUNTER — Ambulatory Visit (INDEPENDENT_AMBULATORY_CARE_PROVIDER_SITE_OTHER): Payer: BC Managed Care – PPO | Admitting: Otolaryngology

## 2023-03-08 VITALS — Ht 72.0 in | Wt 220.0 lb

## 2023-03-08 DIAGNOSIS — H7201 Central perforation of tympanic membrane, right ear: Secondary | ICD-10-CM

## 2023-03-08 DIAGNOSIS — H6121 Impacted cerumen, right ear: Secondary | ICD-10-CM | POA: Diagnosis not present

## 2023-03-08 DIAGNOSIS — H903 Sensorineural hearing loss, bilateral: Secondary | ICD-10-CM | POA: Diagnosis not present

## 2023-03-10 DIAGNOSIS — H903 Sensorineural hearing loss, bilateral: Secondary | ICD-10-CM | POA: Insufficient documentation

## 2023-03-10 DIAGNOSIS — H7201 Central perforation of tympanic membrane, right ear: Secondary | ICD-10-CM | POA: Insufficient documentation

## 2023-03-10 DIAGNOSIS — H6121 Impacted cerumen, right ear: Secondary | ICD-10-CM | POA: Insufficient documentation

## 2023-03-10 NOTE — Progress Notes (Signed)
Patient ID: Luke Wong, male   DOB: 1970-11-20, 52 y.o.   MRN: [id number]  CC: Right ear drainage, right ear pain  HPI: The patient is a 52 year old male who presents today complaining of right ear drainage and right ear pain for the past 10 days.  He has been using Ciprodex eardrops in the right ear for the past 7 days. The patient has a history of bilateral hearing loss and right ear eustachian tube dysfunction. He underwent right myringotomy and T-tube placement in July 2020.  He denies any recent change in his hearing.  He is currently using bilateral hearing aids.  Exam: General: Communicates without difficulty, well nourished, no acute distress. Head: Normocephalic, no evidence injury, no tenderness, facial buttresses intact without stepoff. Eyes: PERRL, EOMI. No scleral icterus, conjunctivae clear. Neuro: CN II exam reveals vision grossly intact. No nystagmus at any point of gaze. Ears: Auricles well formed without lesions.  Right ear cerumen impaction.  Under the operating microscope, the right ear cerumen is carefully removed using a suction catheter.  The right T tube is in place and patent.  A small amount of granulation tissue is noted at the base of the right T-tube.  The left tympanic membrane and middle ear space are normal. Nose: External evaluation reveals normal support and skin without lesions. Dorsum is intact. Anterior rhinoscopy reveals congested and edematous mucosa over anterior aspect of the inferior turbinates and nasal septum. No purulence is noted. Oral:  Oral cavity and oropharynx are intact, symmetric, without erythema or edema. Mucosa is moist without lesions. Neck: Full range of motion without pain. There is no significant lymphadenopathy. No masses palpable. Thyroid bed within normal limits to palpation. Parotid glands and submandibular glands equal bilaterally without mass. Trachea is midline. Neuro:  CN 2-12 grossly intact. Gait normal. Vestibular: No nystagmus at any point  of gaze.   Assessment: 1.  Right ear cerumen impaction.  After the disimpaction procedure, the right T-tube is in place and patent. 2.  A small amount of granulation tissue is noted at the base of the right T-tube. 3.  The left tympanic membrane and middle ear space are normal. 4.  Subjectively stable bilateral high-frequency sensorineural hearing loss.  Plan: 1.  Otomicroscopy with right ear cerumen disimpaction. 2.  The physical exam findings are reviewed with the patient. 3.  Ciprodex eardrops 4 drops right ear twice daily for 5 additional days. 4.  The patient will return for reevaluation in 3 months, sooner if needed.

## 2023-03-19 ENCOUNTER — Ambulatory Visit (AMBULATORY_SURGERY_CENTER): Payer: BC Managed Care – PPO | Admitting: Gastroenterology

## 2023-03-19 ENCOUNTER — Encounter: Payer: Self-pay | Admitting: Gastroenterology

## 2023-03-19 VITALS — BP 110/81 | HR 74 | Resp 11

## 2023-03-19 DIAGNOSIS — K641 Second degree hemorrhoids: Secondary | ICD-10-CM | POA: Diagnosis not present

## 2023-03-19 DIAGNOSIS — K644 Residual hemorrhoidal skin tags: Secondary | ICD-10-CM | POA: Diagnosis not present

## 2023-03-19 DIAGNOSIS — K635 Polyp of colon: Secondary | ICD-10-CM | POA: Diagnosis not present

## 2023-03-19 DIAGNOSIS — D127 Benign neoplasm of rectosigmoid junction: Secondary | ICD-10-CM

## 2023-03-19 DIAGNOSIS — D128 Benign neoplasm of rectum: Secondary | ICD-10-CM

## 2023-03-19 DIAGNOSIS — Q438 Other specified congenital malformations of intestine: Secondary | ICD-10-CM

## 2023-03-19 DIAGNOSIS — Z1211 Encounter for screening for malignant neoplasm of colon: Secondary | ICD-10-CM

## 2023-03-19 MED ORDER — SODIUM CHLORIDE 0.9 % IV SOLN: 500.0000 mL | INTRAVENOUS | Status: DC

## 2023-03-19 NOTE — Op Note (Signed)
[facility] Patient Name: Luke Wong Procedure Date: 03/19/2023 11:19 AM MRN: [id number] Endoscopist: Corliss Parish , MD, [id number] Age: 52 Referring MD:  Date of Birth: 1970/07/04 Gender: Male Account #: [id number] Procedure:                Colonoscopy Indications:              Screening for colorectal malignant neoplasm Medicines:                Monitored Anesthesia Care Procedure:                Pre-Anesthesia Assessment:                           - Prior to the procedure, a History and Physical                            was performed, and patient medications and                            allergies were reviewed. The patient's tolerance of                            previous anesthesia was also reviewed. The risks                            and benefits of the procedure and the sedation                            options and risks were discussed with the patient.                            All questions were answered, and informed consent                            was obtained. Prior Anticoagulants: The patient has                            taken no anticoagulant or antiplatelet agents                            except for aspirin. ASA Grade Assessment: II - A                            patient with mild systemic disease. After reviewing                            the risks and benefits, the patient was deemed in                            satisfactory condition to undergo the procedure.                           After obtaining informed consent, the colonoscope  was passed under direct vision. Throughout the                            procedure, the patient's blood pressure, pulse, and                            oxygen saturations were monitored continuously. The                            Olympus Scope [id number] was introduced through the                            anus and advanced to the the cecum, identified by                             appendiceal orifice and ileocecal valve. The                            colonoscopy was performed without difficulty. The                            patient tolerated the procedure. The quality of the                            bowel preparation was adequate. The ileocecal                            valve, appendiceal orifice, and rectum were                            photographed. Scope In: 11:27:05 AM Scope Out: 11:44:45 AM Scope Withdrawal Time: 0 hours 13 minutes 4 seconds  Total Procedure Duration: 0 hours 17 minutes 40 seconds  Findings:                 The digital rectal exam findings include                            hemorrhoids. Pertinent negatives include no                            palpable rectal lesions.                           A large amount of semi-liquid stool was found in                            the entire colon, interfering with visualization.                            Lavage of the area was performed using copious                            amounts, resulting in clearance with adequate  visualization.                           The colon (entire examined portion) was                            significantly redundant leading to significant                            looping.                           Two sessile polyps were found in the rectum and                            recto-sigmoid colon. The polyps were 2 to 3 mm in                            size. These polyps were removed with a cold snare.                            Resection and retrieval were complete.                           Non-bleeding non-thrombosed external and internal                            hemorrhoids were found during retroflexion, during                            perianal exam and during digital exam. The                            hemorrhoids were Grade II (internal hemorrhoids                            that prolapse but reduce  spontaneously). Complications:            No immediate complications. Estimated Blood Loss:     Estimated blood loss was minimal. Impression:               - Hemorrhoids found on digital rectal exam.                           - Stool in the entire examined colon - lavaged with                            adequate visualization.                           - Redundant colon leading to significant looping.                           - Two 2 to 3 mm polyps in the rectum and at the  recto-sigmoid colon, removed with a cold snare.                            Resected and retrieved.                           - Non-bleeding non-thrombosed external and internal                            hemorrhoids. Recommendation:           - The patient will be observed post-procedure,                            until all discharge criteria are met.                           - Discharge patient to home.                           - Patient has a contact number available for                            emergencies. The signs and symptoms of potential                            delayed complications were discussed with the                            patient. Return to normal activities tomorrow.                            Written discharge instructions were provided to the                            patient.                           - High fiber diet.                           - Use FiberCon 1-2 tablets PO daily.                           - Continue present medications.                           - Await pathology results.                           - Repeat colonoscopy in 5-10 years for surveillance                            based on pathology results and findings of                            adenomatous tissue. Recommend 1 week of MiraLAX  daily and every other day Dulcolax before next                            colonoscopy.                           - The  findings and recommendations were discussed                            with the patient.                           - The findings and recommendations were discussed                            with the patient's family. Corliss Parish, MD 03/19/2023 11:53:29 AM

## 2023-03-19 NOTE — Progress Notes (Signed)
GASTROENTEROLOGY PROCEDURE H&P NOTE   Primary Care Physician: Tisovec, Adelfa Koh, MD  HPI: Luke Wong is a 52 y.o. male who presents for Colonoscopy for screening.  Past Medical History:  Diagnosis Date   Allergy    Asthma    Chronic back pain    Depression    GERD (gastroesophageal reflux disease)    Hyperlipidemia    Pneumonia 2015   Post laminectomy syndrome    Past Surgical History:  Procedure Laterality Date   ABDOMINAL EXPOSURE N/A 03/27/2019   Procedure: ABDOMINAL EXPOSURE;  Surgeon: Cephus Shelling, MD;  Location: [facility] OR;  Service: Vascular;  Laterality: N/A;  anterior   ANKLE SURGERY Left 06/07/13, 3/16   Arthroscopic debridement  x2   ANTERIOR LUMBAR FUSION N/A 03/27/2019   Procedure: ANTERIOR LUMBAR INTERBODY FUSION LUMBAR FIVE-SACRAL- ONE.;  Surgeon: Tia Alert, MD;  Location: [facility] OR;  Service: Neurosurgery;  Laterality: N/A;  ANTERIOR APPROACH   BACK SURGERY     HERNIA REPAIR Bilateral 1994   LUMBAR LAMINECTOMY/ DECOMPRESSION WITH MET-RX Right 11/30/2014   Procedure: Right L4-5 Far Lateral Microdiskectomy w/ Metrx;  Surgeon: Loura Halt Ditty, MD;  Location: [facility];  Service: Neurosurgery;  Laterality: Right;  Right L4-5 Far Lateral Microdiskectomy   MYRINGOTOMY WITH TUBE PLACEMENT Right 10/28/2018   Procedure: RIGHT MYRINGOTOMY WITH  T TUBE PLACEMENT;  Surgeon: Newman Pies, MD;  Location: [facility];  Service: ENT;  Laterality: Right;   SPINAL FUSION     Current Outpatient Medications  Medication Sig Dispense Refill   acetaminophen (TYLENOL) 325 MG tablet Take 2 tablets (650 mg total) by mouth every 4 (four) hours as needed for mild pain ((score 1 to 3) or temp > 100.5).     albuterol (VENTOLIN HFA) 108 (90 Base) MCG/ACT inhaler Inhale into the lungs.     aspirin 81 MG tablet Take 81 mg by mouth daily.     atorvastatin (LIPITOR) 40 MG tablet Take 1 tablet (40 mg total) by mouth daily. 90 tablet 0   BELBUCA 450 MCG FILM SMARTSIG:1  Strip(s) By Mouth Twice Daily PRN (Patient not taking: Reported on 03/08/2023)     ciprofloxacin-dexamethasone (CIPRODEX) OTIC suspension SMARTSIG:4 Drop(s) Right Ear Daily PRN     DULoxetine (CYMBALTA) 60 MG capsule Take 60 mg by mouth daily.      empagliflozin (JARDIANCE) 25 MG TABS tablet Take by mouth daily.     famotidine (PEPCID) 20 MG tablet Take 20 mg by mouth at bedtime.      fenofibrate (TRICOR) 145 MG tablet Take 1 tablet (145 mg total) by mouth daily. 90 tablet 0   fexofenadine (ALLEGRA) 180 MG tablet Take 180 mg by mouth daily.     icosapent Ethyl (VASCEPA) 1 g capsule TAKE 2 CAPSULES BY MOUTH TWICE DAILY WITH MEALS. Please call [phone] to schedule an appointment with Dr. Dietrich Pates for future refills. Thank you. 1st attempt. 120 capsule 0   methocarbamol (ROBAXIN) 500 MG tablet Take 1 tablet (500 mg total) by mouth every 6 (six) hours as needed for muscle spasms. 28 tablet 1   metoprolol tartrate (LOPRESSOR) 100 MG tablet Take one tablet (100 mg) by mouth 2 hours prior to your Cardiac CT. (Patient not taking: Reported on 03/06/2023) 1 tablet 0   nortriptyline (PAMELOR) 25 MG capsule Take 25 mg by mouth at bedtime.      oxyCODONE 10 MG TABS Take 1 tablet (10 mg total) by mouth every 4 (four) hours as  needed for severe pain ((score 7 to 10)). 42 tablet 0   REPATHA SURECLICK 140 MG/ML SOAJ ADMINISTER 1 ML UNDER THE SKIN EVERY 14 DAYS 6 mL 3   traZODone (DESYREL) 100 MG tablet Take 100 mg by mouth at bedtime.   5   No current facility-administered medications for this visit.    Current Outpatient Medications:    acetaminophen (TYLENOL) 325 MG tablet, Take 2 tablets (650 mg total) by mouth every 4 (four) hours as needed for mild pain ((score 1 to 3) or temp > 100.5)., Disp:  , Rfl:    albuterol (VENTOLIN HFA) 108 (90 Base) MCG/ACT inhaler, Inhale into the lungs., Disp: , Rfl:    aspirin 81 MG tablet, Take 81 mg by mouth daily., Disp: , Rfl:    atorvastatin (LIPITOR) 40 MG tablet,  Take 1 tablet (40 mg total) by mouth daily., Disp: 90 tablet, Rfl: 0   BELBUCA 450 MCG FILM, SMARTSIG:1 Strip(s) By Mouth Twice Daily PRN (Patient not taking: Reported on 03/08/2023), Disp: , Rfl:    ciprofloxacin-dexamethasone (CIPRODEX) OTIC suspension, SMARTSIG:4 Drop(s) Right Ear Daily PRN, Disp: , Rfl:    DULoxetine (CYMBALTA) 60 MG capsule, Take 60 mg by mouth daily. , Disp: , Rfl:    empagliflozin (JARDIANCE) 25 MG TABS tablet, Take by mouth daily., Disp: , Rfl:    famotidine (PEPCID) 20 MG tablet, Take 20 mg by mouth at bedtime. , Disp: , Rfl:    fenofibrate (TRICOR) 145 MG tablet, Take 1 tablet (145 mg total) by mouth daily., Disp: 90 tablet, Rfl: 0   fexofenadine (ALLEGRA) 180 MG tablet, Take 180 mg by mouth daily., Disp: , Rfl:    icosapent Ethyl (VASCEPA) 1 g capsule, TAKE 2 CAPSULES BY MOUTH TWICE DAILY WITH MEALS. Please call [phone] to schedule an appointment with Dr. Dietrich Pates for future refills. Thank you. 1st attempt., Disp: 120 capsule, Rfl: 0   methocarbamol (ROBAXIN) 500 MG tablet, Take 1 tablet (500 mg total) by mouth every 6 (six) hours as needed for muscle spasms., Disp: 28 tablet, Rfl: 1   metoprolol tartrate (LOPRESSOR) 100 MG tablet, Take one tablet (100 mg) by mouth 2 hours prior to your Cardiac CT. (Patient not taking: Reported on 03/06/2023), Disp: 1 tablet, Rfl: 0   nortriptyline (PAMELOR) 25 MG capsule, Take 25 mg by mouth at bedtime. , Disp: , Rfl:    oxyCODONE 10 MG TABS, Take 1 tablet (10 mg total) by mouth every 4 (four) hours as needed for severe pain ((score 7 to 10))., Disp: 42 tablet, Rfl: 0   REPATHA SURECLICK 140 MG/ML SOAJ, ADMINISTER 1 ML UNDER THE SKIN EVERY 14 DAYS, Disp: 6 mL, Rfl: 3   traZODone (DESYREL) 100 MG tablet, Take 100 mg by mouth at bedtime. , Disp: , Rfl: 5 Allergies  Allergen Reactions   Meperidine Other (See Comments)    Unknown reaction type Pt unsure Unknown reaction type   Family History  Problem Relation Age of Onset    Thyroid cancer Mother    Heart disease Father    Heart attack Father    Heart attack Paternal Grandfather    Colon cancer Neg Hx    Colon polyps Neg Hx    Esophageal cancer Neg Hx    Rectal cancer Neg Hx    Stomach cancer Neg Hx    Social History   Socioeconomic History   Marital status: Married    Spouse name: Not on file   Number of children: Not on file  Years of education: Not on file   Highest education level: Not on file  Occupational History   Occupation: Attorney  Tobacco Use   Smoking status: Never   Smokeless tobacco: Never  Vaping Use   Vaping status: Never Used  Substance and Sexual Activity   Alcohol use: Yes    Comment: once per wk 0- 3 wine, beer, liquor   Drug use: No   Sexual activity: Not on file  Other Topics Concern   Not on file  Social History Narrative   Not on file   Social Determinants of Health   Financial Resource Strain: Low Risk  (08/29/2021)   Received from [facility], [facility]   Overall Financial Resource Strain (CARDIA)    Difficulty of Paying Living Expenses: Not hard at all  Food Insecurity: No Food Insecurity (08/29/2021)   Received from [facility], [facility]   Hunger Vital Sign    Worried About Running Out of Food in the Last Year: Never true    Ran Out of Food in the Last Year: Never true  Transportation Needs: No Transportation Needs (05/12/2021)   Received from [facility], [facility]   PRAPARE - Transportation    Lack of Transportation (Medical): No    Lack of Transportation (Non-Medical): No  Physical Activity: Unknown (08/29/2021)   Received from [facility], [facility]   Exercise Vital Sign    Days of Exercise per Week: 1 day    Minutes of Exercise per Session: Not on file  Stress: Stress Concern Present (08/29/2021)   Received from [facility], [facility] - Occupational Stress Questionnaire    Feeling of Stress : To some extent  Social Connections:  Unknown (09/02/2022)   Received from [facility]   Social Network    Social Network: Not on file  Intimate Partner Violence: Unknown (09/02/2022)   Received from [facility]   HITS    Physically Hurt: Not on file    Insult or Talk Down To: Not on file    Threaten Physical Harm: Not on file    Scream or Curse: Not on file    Physical Exam: There were no vitals filed for this visit. There is no height or weight on file to calculate BMI. GEN: NAD EYE: Sclerae anicteric ENT: MMM CV: Non-tachycardic GI: Soft, NT/ND NEURO:  Alert & Oriented x 3  Lab Results: No results for input(s): "WBC", "HGB", "HCT", "PLT" in the last 72 hours. BMET No results for input(s): "NA", "K", "CL", "CO2", "GLUCOSE", "BUN", "CREATININE", "CALCIUM" in the last 72 hours. LFT No results for input(s): "PROT", "ALBUMIN", "AST", "ALT", "ALKPHOS", "BILITOT", "BILIDIR", "IBILI" in the last 72 hours. PT/INR No results for input(s): "LABPROT", "INR" in the last 72 hours.   Impression / Plan: This is a 52 y.o.male who presents for Colonoscopy for screening.  The risks and benefits of endoscopic evaluation/treatment were discussed with the patient and/or family; these include but are not limited to the risk of perforation, infection, bleeding, missed lesions, lack of diagnosis, severe illness requiring hospitalization, as well as anesthesia and sedation related illnesses.  The patient's history has been reviewed, patient examined, no change in status, and deemed stable for procedure.  The patient and/or family is agreeable to proceed.    Corliss Parish, MD [facility]oscopy Office # [phone]

## 2023-03-19 NOTE — Progress Notes (Signed)
Vss nad trans to pacu 

## 2023-03-19 NOTE — Progress Notes (Signed)
Pt's states no medical or surgical changes since previsit or office visit. 

## 2023-03-19 NOTE — Patient Instructions (Signed)
Thank you for letting us take care of your healthcare needs today. Please see handouts given to you on Polyps and Hemorrhoids and High Fiber Diet. Fiber-Con 1-2 tablets daily.     YOU HAD AN ENDOSCOPIC PROCEDURE TODAY AT THE [facility]:   Refer to the procedure report that was given to you for any specific questions about what was found during the examination.  If the procedure report does not answer your questions, please call your gastroenterologist to clarify.  If you requested that your care partner not be given the details of your procedure findings, then the procedure report has been included in a sealed envelope for you to review at your convenience later.  YOU SHOULD EXPECT: Some feelings of bloating in the abdomen. Passage of more gas than usual.  Walking can help get rid of the air that was put into your GI tract during the procedure and reduce the bloating. If you had a lower endoscopy (such as a colonoscopy or flexible sigmoidoscopy) you may notice spotting of blood in your stool or on the toilet paper. If you underwent a bowel prep for your procedure, you may not have a normal bowel movement for a few days.  Please Note:  You might notice some irritation and congestion in your nose or some drainage.  This is from the oxygen used during your procedure.  There is no need for concern and it should clear up in a day or so.  SYMPTOMS TO REPORT IMMEDIATELY:  Following lower endoscopy (colonoscopy or flexible sigmoidoscopy):  Excessive amounts of blood in the stool  Significant tenderness or worsening of abdominal pains  Swelling of the abdomen that is new, acute  Fever of 100F or higher  For urgent or emergent issues, a gastroenterologist can be reached at any hour by calling [phone]. Do not use MyChart messaging for urgent concerns.    DIET:  We do recommend a small meal at first, but then you may proceed to your regular diet.  Drink plenty of fluids but you  should avoid alcoholic beverages for 24 hours.  ACTIVITY:  You should plan to take it easy for the rest of today and you should NOT DRIVE or use heavy machinery until tomorrow (because of the sedation medicines used during the test).    FOLLOW UP: Our staff will call the number listed on your records the next business day following your procedure.  We will call around 7:15- 8:00 am to check on you and address any questions or concerns that you may have regarding the information given to you following your procedure. If we do not reach you, we will leave a message.     If any biopsies were taken you will be contacted by phone or by letter within the next 1-3 weeks.  Please call us at [phone] if you have not heard about the biopsies in 3 weeks.    SIGNATURES/CONFIDENTIALITY: You and/or your care partner have signed paperwork which will be entered into your electronic medical record.  These signatures attest to the fact that that the information above on your After Visit Summary has been reviewed and is understood.  Full responsibility of the confidentiality of this discharge information lies with you and/or your care-partner.

## 2023-03-20 ENCOUNTER — Telehealth: Payer: Self-pay

## 2023-03-20 NOTE — Telephone Encounter (Signed)
  Follow up Call-     03/19/2023   10:48 AM  Call back number  Post procedure Call Back phone  # [phone]  Permission to leave phone message Yes     Patient questions:  Do you have a fever, pain , or abdominal swelling? No. Pain Score  0 *  Have you tolerated food without any problems? Yes.    Have you been able to return to your normal activities? Yes.    Do you have any questions about your discharge instructions: Diet   No. Medications  No. Follow up visit  No.  Do you have questions or concerns about your Care? No.  Actions: * If pain score is 4 or above: No action needed, pain <4.

## 2023-03-22 LAB — SURGICAL PATHOLOGY

## 2023-03-28 ENCOUNTER — Encounter: Payer: Self-pay | Admitting: Gastroenterology

## 2023-04-09 ENCOUNTER — Other Ambulatory Visit: Payer: Self-pay | Admitting: Internal Medicine

## 2023-04-09 DIAGNOSIS — E782 Mixed hyperlipidemia: Secondary | ICD-10-CM

## 2023-04-09 DIAGNOSIS — I25119 Atherosclerotic heart disease of native coronary artery with unspecified angina pectoris: Secondary | ICD-10-CM

## 2023-04-09 DIAGNOSIS — R931 Abnormal findings on diagnostic imaging of heart and coronary circulation: Secondary | ICD-10-CM

## 2023-04-10 MED ORDER — ICOSAPENT ETHYL 1 G PO CAPS: ORAL_CAPSULE | ORAL | 0 refills | Status: DC

## 2023-04-12 ENCOUNTER — Other Ambulatory Visit (HOSPITAL_COMMUNITY): Payer: Self-pay

## 2023-04-12 ENCOUNTER — Other Ambulatory Visit: Payer: Self-pay | Admitting: Pharmacist

## 2023-04-12 DIAGNOSIS — E782 Mixed hyperlipidemia: Secondary | ICD-10-CM

## 2023-04-12 DIAGNOSIS — I25119 Atherosclerotic heart disease of native coronary artery with unspecified angina pectoris: Secondary | ICD-10-CM

## 2023-04-12 DIAGNOSIS — R931 Abnormal findings on diagnostic imaging of heart and coronary circulation: Secondary | ICD-10-CM

## 2023-04-12 MED ORDER — REPATHA SURECLICK 140 MG/ML ~~LOC~~ SOAJ
140.0000 mg | SUBCUTANEOUS | 0 refills | Status: DC
  Filled 2023-04-12: qty 6, 84d supply, fill #0

## 2023-04-12 NOTE — Telephone Encounter (Signed)
*  STAT* If patient is at the pharmacy, call can be transferred to refill team.   1. Which medications need to be refilled? (please list name of each medication and dose if known)   REPATHA SURECLICK 140 MG/ML SOAJ    2. Which pharmacy/location (including street and city if local pharmacy) is medication to be sent to? [id number] - [place], [place] - [address] AT [facility] OF [address]      3. Do they need a 30 day or 90 day supply? 90 day   Pt also needs prior authorization for his VASCEPA medication. Please advise

## 2023-04-12 NOTE — Telephone Encounter (Signed)
 Pt is requesting Repatha

## 2023-04-22 ENCOUNTER — Other Ambulatory Visit: Payer: Self-pay

## 2023-04-22 DIAGNOSIS — E782 Mixed hyperlipidemia: Secondary | ICD-10-CM

## 2023-04-22 DIAGNOSIS — I25119 Atherosclerotic heart disease of native coronary artery with unspecified angina pectoris: Secondary | ICD-10-CM

## 2023-04-22 DIAGNOSIS — R931 Abnormal findings on diagnostic imaging of heart and coronary circulation: Secondary | ICD-10-CM

## 2023-04-22 MED ORDER — ICOSAPENT ETHYL 1 G PO CAPS: ORAL_CAPSULE | ORAL | 1 refills | Status: DC

## 2023-04-23 ENCOUNTER — Telehealth: Payer: Self-pay | Admitting: Pharmacy Technician

## 2023-04-23 ENCOUNTER — Other Ambulatory Visit (HOSPITAL_COMMUNITY): Payer: Self-pay

## 2023-04-23 NOTE — Telephone Encounter (Signed)
 Pharmacy Patient Advocate Encounter  Received notification from [facility]   that Prior Authorization for Vascepa  1GM capsules has been APPROVED from 04/23/23 to 04/22/24-estimated time since no time given. Ran test claim, Copay is $0.00. This test claim was processed through [facility]- copay amounts may vary at other pharmacies due to pharmacy/plan contracts, or as the patient moves through the different stages of their insurance plan.   PA #/Case ID/Reference #: [id number]

## 2023-04-23 NOTE — Telephone Encounter (Signed)
Confirmation came back and prior authorization is approved from 03/24/23-04/22/24

## 2023-06-09 NOTE — Progress Notes (Deleted)
 Cardiology Office Note   Date:  06/09/2023   ID:  Luke Wong, Luke Wong 05-Jul-1970, MRN [id number]  PCP:  Gaspar Garbe, MD  Cardiologist:   Dietrich Pates, MD    F/U of hyperlipidemia and BP       History of Present Illness: Luke Wong is a 53 y.o. male with a history of hyperlipidemia.  The pt also has a very strong family history of CAD (no male lived after 53 due to heart problems)  Note that CT Ca score was 0 H  )In addition to Ca score the pt has had stress tests  Last Lexiscan myovue for CP was in 2017  This showed a small, fixed defect that was probable artifact   LVEF 54%.  P Th pt also has a hx of reflux and DJD  I saw the pt in May 2022  In June he had a Ca score CT that was 54 (80th percentile for age)     Fatty liver changes noted   Lipds   LDL 84 with 1378 partilces  Trgi 368  HDL 30   Lpa 10 ApoB 88    Since seen the pt says he still has intermitt episodes of CP  Last 5 to 39 min   Takes ASA     Not associated with physical activity   ? Mental activity.    Frightening to him   Not sure what it is    Continues to have problems with back   Limits activity  I saw the pt in Sept 2024   He was seen by Margrett Rud in Nov 2023  Outpatient Medications Prior to Visit  Medication Sig Dispense Refill   acetaminophen (TYLENOL) 325 MG tablet Take 2 tablets (650 mg total) by mouth every 4 (four) hours as needed for mild pain ((score 1 to 3) or temp > 100.5).     albuterol (VENTOLIN HFA) 108 (90 Base) MCG/ACT inhaler Inhale into the lungs.     aspirin 81 MG tablet Take 81 mg by mouth daily.     atorvastatin (LIPITOR) 40 MG tablet Take 1 tablet (40 mg total) by mouth daily. 90 tablet 0   DULoxetine (CYMBALTA) 60 MG capsule Take 60 mg by mouth daily.      empagliflozin (JARDIANCE) 25 MG TABS tablet Take by mouth daily.     Evolocumab (REPATHA SURECLICK) 140 MG/ML SOAJ Inject 140 mg into the skin every 14 (fourteen) days. 6 mL 0   famotidine (PEPCID) 20 MG tablet Take 20 mg by mouth at  bedtime.      fenofibrate (TRICOR) 145 MG tablet Take 1 tablet (145 mg total) by mouth daily. 90 tablet 0   fexofenadine (ALLEGRA) 180 MG tablet Take 180 mg by mouth daily.     icosapent Ethyl (VASCEPA) 1 g capsule TAKE 2 CAPSULES BY MOUTH TWICE DAILY WITH MEALS. 120 capsule 1   methocarbamol (ROBAXIN) 500 MG tablet Take 1 tablet (500 mg total) by mouth every 6 (six) hours as needed for muscle spasms. 28 tablet 1   nortriptyline (PAMELOR) 25 MG capsule Take 25 mg by mouth at bedtime.      oxyCODONE 10 MG TABS Take 1 tablet (10 mg total) by mouth every 4 (four) hours as needed for severe pain ((score 7 to 10)). 42 tablet 0   pantoprazole (PROTONIX) 40 MG tablet Take 40 mg by mouth daily.     traZODone (DESYREL) 100 MG tablet Take 100 mg by mouth at  bedtime.   5   No facility-administered medications prior to visit.     Allergies:   Meperidine   Past Medical History:  Diagnosis Date   Allergy    Asthma    Chronic back pain    Depression    GERD (gastroesophageal reflux disease)    Hyperlipidemia    Pneumonia 2015   Post laminectomy syndrome     Past Surgical History:  Procedure Laterality Date   ABDOMINAL EXPOSURE N/A 03/27/2019   Procedure: ABDOMINAL EXPOSURE;  Surgeon: Cephus Shelling, MD;  Location: [facility] OR;  Service: Vascular;  Laterality: N/A;  anterior   ANKLE SURGERY Left 06/07/13, 3/16   Arthroscopic debridement  x2   ANTERIOR LUMBAR FUSION N/A 03/27/2019   Procedure: ANTERIOR LUMBAR INTERBODY FUSION LUMBAR FIVE-SACRAL- ONE.;  Surgeon: Tia Alert, MD;  Location: [facility] OR;  Service: Neurosurgery;  Laterality: N/A;  ANTERIOR APPROACH   BACK SURGERY     HERNIA REPAIR Bilateral 1994   LUMBAR LAMINECTOMY/ DECOMPRESSION WITH MET-RX Right 11/30/2014   Procedure: Right L4-5 Far Lateral Microdiskectomy w/ Metrx;  Surgeon: Loura Halt Ditty, MD;  Location: [facility];  Service: Neurosurgery;  Laterality: Right;  Right L4-5 Far Lateral Microdiskectomy   MYRINGOTOMY WITH TUBE  PLACEMENT Right 10/28/2018   Procedure: RIGHT MYRINGOTOMY WITH  T TUBE PLACEMENT;  Surgeon: Newman Pies, MD;  Location: [facility];  Service: ENT;  Laterality: Right;   SPINAL FUSION       Social History:  The patient  reports that he has never smoked. He has never used smokeless tobacco. He reports current alcohol use. He reports that he does not use drugs.   Family History:  The patient's family history includes Heart attack in his father and paternal grandfather; Heart disease in his father; Thyroid cancer in his mother.    ROS:  Please see the history of present illness. All other systems are reviewed and  Negative to the above problem except as noted.    PHYSICAL EXAM: VS:  There were no vitals taken for this visit.  GEN:  Obese 53 yo in NAD    Neck: JVP is normal   No carotid bruits Cardiac: RRR; no murmurs  No LE  edema  Respiratory:  clear to auscultation bilaterally GI: soft, nontender, nondistended, + BS  No hepatomegaly  MS: no deformity Moving all extremities   Skin: warm and dry, no rash Neuro:  Strength and sensation are intact Psych: euthymic mood, full affect   EKG:  EKG shows SR 95 bpm      Lipid Panel    Component Value Date/Time   CHOL 89 (L) 05/16/2022 1028   TRIG 198 (H) 05/16/2022 1028   HDL 31 (L) 05/16/2022 1028   CHOLHDL 2.9 05/16/2022 1028   CHOLHDL 7.0 (H) 11/25/2015 0917   VLDL 47 (H) 11/25/2015 0917   LDLCALC 26 05/16/2022 1028   LDLDIRECT 113.0 09/16/2014 0814      Wt Readings from Last 3 Encounters:  03/08/23 220 lb (99.8 kg)  03/06/23 220 lb (99.8 kg)  02/28/22 234 lb (106.1 kg)      ASSESSMENT AND PLAN:  1  CP   Pt with low Ca score   Intermitt episodes of CP   Pt very concerned with FHx   Not clearly relfux    I would recomm coronary CT angiogram to define anatomy           2  HL  Last lipids showed LDL 77  HDL 21  Trig 235   This was 1 year ago   Repeat LDL from Dr Wylene Simmer close to 100   WIll repeat fasting lipomed   Limit carbs   3  HTN   BP is controlled  Continue to follow   F/U based on trest results   Signed, Dietrich Pates, MD  06/09/2023 2:23 PM    [facility] [address] [place], [place], [place]  [address] Phone: [phone]; Fax: [phone]

## 2023-06-10 ENCOUNTER — Ambulatory Visit: Payer: BC Managed Care – PPO | Admitting: Internal Medicine

## 2023-06-14 ENCOUNTER — Telehealth (INDEPENDENT_AMBULATORY_CARE_PROVIDER_SITE_OTHER): Payer: Self-pay | Admitting: Otolaryngology

## 2023-06-14 NOTE — Telephone Encounter (Signed)
 Confirmed appt & location [id number] afm

## 2023-06-17 ENCOUNTER — Ambulatory Visit (INDEPENDENT_AMBULATORY_CARE_PROVIDER_SITE_OTHER): Payer: BC Managed Care – PPO | Admitting: Otolaryngology

## 2023-06-17 ENCOUNTER — Encounter (INDEPENDENT_AMBULATORY_CARE_PROVIDER_SITE_OTHER): Payer: Self-pay

## 2023-06-17 VITALS — BP 117/76 | HR 72 | Ht 72.0 in | Wt 225.0 lb

## 2023-06-17 DIAGNOSIS — H903 Sensorineural hearing loss, bilateral: Secondary | ICD-10-CM | POA: Diagnosis not present

## 2023-06-17 DIAGNOSIS — H608X1 Other otitis externa, right ear: Secondary | ICD-10-CM | POA: Diagnosis not present

## 2023-06-17 DIAGNOSIS — Z9629 Presence of other otological and audiological implants: Secondary | ICD-10-CM

## 2023-06-17 DIAGNOSIS — H6981 Other specified disorders of Eustachian tube, right ear: Secondary | ICD-10-CM | POA: Diagnosis not present

## 2023-06-17 DIAGNOSIS — Z09 Encounter for follow-up examination after completed treatment for conditions other than malignant neoplasm: Secondary | ICD-10-CM

## 2023-06-17 DIAGNOSIS — H7201 Central perforation of tympanic membrane, right ear: Secondary | ICD-10-CM

## 2023-06-18 DIAGNOSIS — H608X1 Other otitis externa, right ear: Secondary | ICD-10-CM | POA: Insufficient documentation

## 2023-06-18 DIAGNOSIS — H6981 Other specified disorders of Eustachian tube, right ear: Secondary | ICD-10-CM | POA: Insufficient documentation

## 2023-06-18 NOTE — Progress Notes (Signed)
 Patient ID: Luke Wong, male   DOB: 15-Apr-1971, 53 y.o.   MRN: [id number]  Follow-up: Hearing loss, right ear eustachian tube dysfunction  HPI: The patient is a 53 year old male who returns today for his follow-up evaluation.  The patient has a history of bilateral hearing loss and right ear eustachian tube dysfunction. He underwent right myringotomy and T-tube placement.  He was also fitted with bilateral hearing aids.  According to the patient, he has been doing well in regards to his hearing and eustachian tube dysfunction.  He has not noted any change in his hearing.  The hearing aids have helped.  He denies any otalgia or otorrhea.  However, he complains of frequent itchy sensation in the right ear.  He is wearing his hearing aids daily.  Exam: General: Communicates without difficulty, well nourished, no acute distress. Head: Normocephalic, no evidence injury, no tenderness, facial buttresses intact without stepoff. Face/sinus: No tenderness to palpation and percussion. Facial movement is normal and symmetric. Eyes: PERRL, EOMI. No scleral icterus, conjunctivae clear. Neuro: CN II exam reveals vision grossly intact.  No nystagmus at any point of gaze. Ears: Auricles well formed without lesions.  Ear canals are intact with eczematous changes.  The right T-tube is in place and patent.  The left tympanic membrane is intact.  Nose: External evaluation reveals normal support and skin without lesions.  Dorsum is intact.  Anterior rhinoscopy reveals congested mucosa over anterior aspect of inferior turbinates and intact septum.  No purulence noted. Oral:  Oral cavity and oropharynx are intact, symmetric, without erythema or edema.  Mucosa is moist without lesions. Neck: Full range of motion without pain.  There is no significant lymphadenopathy.  No masses palpable.  Thyroid bed within normal limits to palpation.  Parotid glands and submandibular glands equal bilaterally without mass.  Trachea is midline.  Neuro:  CN 2-12 grossly intact.   Assessment: 1.  Chronic right eczematous otitis externa. 2.  Subjectively stable bilateral high-frequency sensorineural hearing loss. 3.  Right ear eustachian tube dysfunction.  His right T-tube is in place and patent.  Plan: 1.  The physical exam findings are reviewed with the patient. 2.  Elocon cream/Ciprodex eardrops to treat the eczematous otitis externa. 3.  Continue dry ear precautions on the right side. 4.  Continue the use of his hearing aids. 5.  The patient will return for reevaluation in 6 months.

## 2023-06-27 ENCOUNTER — Other Ambulatory Visit: Payer: Self-pay | Admitting: Internal Medicine

## 2023-06-27 ENCOUNTER — Ambulatory Visit: Payer: BC Managed Care – PPO | Admitting: Nurse Practitioner

## 2023-06-27 DIAGNOSIS — R931 Abnormal findings on diagnostic imaging of heart and coronary circulation: Secondary | ICD-10-CM

## 2023-06-27 DIAGNOSIS — E782 Mixed hyperlipidemia: Secondary | ICD-10-CM

## 2023-06-27 DIAGNOSIS — I25119 Atherosclerotic heart disease of native coronary artery with unspecified angina pectoris: Secondary | ICD-10-CM

## 2023-07-10 ENCOUNTER — Encounter: Payer: Self-pay | Admitting: Physician Assistant

## 2023-07-10 ENCOUNTER — Ambulatory Visit: Attending: Physician Assistant | Admitting: Physician Assistant

## 2023-07-10 VITALS — BP 108/70 | HR 85 | Ht 72.0 in | Wt 230.0 lb

## 2023-07-10 DIAGNOSIS — Z79899 Other long term (current) drug therapy: Secondary | ICD-10-CM

## 2023-07-10 DIAGNOSIS — R931 Abnormal findings on diagnostic imaging of heart and coronary circulation: Secondary | ICD-10-CM

## 2023-07-10 DIAGNOSIS — E782 Mixed hyperlipidemia: Secondary | ICD-10-CM

## 2023-07-10 DIAGNOSIS — I10 Essential (primary) hypertension: Secondary | ICD-10-CM | POA: Diagnosis not present

## 2023-07-10 DIAGNOSIS — I25119 Atherosclerotic heart disease of native coronary artery with unspecified angina pectoris: Secondary | ICD-10-CM

## 2023-07-10 MED ORDER — EMPAGLIFLOZIN 25 MG PO TABS
25.0000 mg | ORAL_TABLET | Freq: Every day | ORAL | 3 refills | Status: AC
Start: 2023-07-10 — End: ?

## 2023-07-10 MED ORDER — ATORVASTATIN CALCIUM 40 MG PO TABS: 40.0000 mg | ORAL_TABLET | Freq: Every day | ORAL | 3 refills | Status: AC

## 2023-07-10 MED ORDER — EMPAGLIFLOZIN 25 MG PO TABS: 25.0000 mg | ORAL_TABLET | Freq: Every day | ORAL | 3 refills | Status: DC

## 2023-07-10 MED ORDER — ATORVASTATIN CALCIUM 40 MG PO TABS
40.0000 mg | ORAL_TABLET | Freq: Every day | ORAL | 3 refills | Status: DC
Start: 2023-07-10 — End: 2023-07-10

## 2023-07-10 MED ORDER — ICOSAPENT ETHYL 1 G PO CAPS: 2.0000 g | ORAL_CAPSULE | Freq: Two times a day (BID) | ORAL | 3 refills | Status: DC

## 2023-07-10 NOTE — Patient Instructions (Signed)
 Medication Instructions:  Your physician recommends that you continue on your current medications as directed. Please refer to the Current Medication list given to you today.  *If you need a refill on your cardiac medications before your next appointment, please call your pharmacy*   Lab Work: NONE If you have labs (blood work) drawn today and your tests are completely normal, you will receive your results only by: MyChart Message (if you have MyChart) OR A paper copy in the mail If you have any lab test that is abnormal or we need to change your treatment, we will call you to review the results.   Testing/Procedures: NONE   Follow-Up: At [facility], you and your health needs are our priority.  As part of our continuing mission to provide you with exceptional heart care, we have created designated Provider Care Teams.  These Care Teams include your primary Cardiologist (physician) and Advanced Practice Providers (APPs -  Physician Assistants and Nurse Practitioners) who all work together to provide you with the care you need, when you need it.  We recommend signing up for the patient portal called "MyChart".  Sign up information is provided on this After Visit Summary.  MyChart is used to connect with patients for Virtual Visits (Telemedicine).  Patients are able to view lab/test results, encounter notes, upcoming appointments, etc.  Non-urgent messages can be sent to your provider as well.   To learn more about what you can do with MyChart, go to [email or web address].    Your next appointment:   1 year  Provider:   Dr. Dietrich Pates, MD  Other Instructions   1st Floor: - Lobby - Registration  - Pharmacy  - Lab - Cafe  2nd Floor: - PV Lab - Diagnostic Testing (echo, CT, nuclear med)  3rd Floor: - Vacant  4th Floor: - TCTS (cardiothoracic surgery) - AFib Clinic - Structural Heart Clinic - Vascular Surgery  - Vascular Ultrasound  5th Floor: - HeartCare  Cardiology (general and EP) - Clinical Pharmacy for coumadin, hypertension, lipid, weight-loss medications, and med management appointments    Valet parking services will be available as well.

## 2023-07-10 NOTE — Addendum Note (Signed)
 Addended by: Erick Alley on: 07/10/2023 02:48 PM   Modules accepted: Orders

## 2023-07-10 NOTE — Progress Notes (Signed)
 Cardiology Office Note:  .   Date:  07/10/2023  ID:  Oneita Kras, DOB 02-04-1971, MRN [id number] PCP: Gaspar Garbe, MD  [facility] Providers   History of Present Illness: .   Luke Wong is a 53 y.o. male with a history of hyperlipidemia.  The patient has a strong family history of CAD) he no male lived after 26 due to heart problems).  CT calcium score was 0.  In addition to the calcium score patient also had a Lexiscan Myoview due to chest pain in 2017.  Showed a small fixed defect that was probable artifact.  LVEF 54%.  Patient has a history of reflux and DJD.  The patient was seen May 2022.  In June he had a coronary calcium score of 23 (80th percentile for age).  Fatty liver changes noted.  Lipids with LDL 84 and triglycerides 368.  HDL 30.  Lipoprotein a 10.  He was seen 01/01/22 and was having CP lasting 5-39 minutes. Takes ASA, not associated with physicial activity. Continues to have back problems.   Today, he presents with a history of high triglycerides,  for a routine follow-up. He reports that his triglycerides have been high his entire life, despite various treatments. He is currently on Vascepa, which has managed to lower his triglycerides to 200, a level he describes as 'fairly miraculous.' He has a significant family history of heart disease, with multiple male relatives having suffered heart attacks at a young age, despite not being overweight and seeing cardiologists regularly.  The patient also reports occasional chest pain, which he initially feared was cardiac-related due to his family history. However, he has since determined that the pain is due to GERD. Since starting Protonix, his symptoms have improved significantly, going from multiple episodes a week to only a handful since starting the medication.  In addition to his high triglycerides and GERD, the patient has chronic back pain due to four major back surgeries. He manages his pain with oxycodone and  Advil, and reports that the medications make the pain manageable. He has recently been able to start exercising again, walking 30-60 minutes 4-5 times a week on a treadmill.  The patient also reports a recent bout of flu, bronchitis, and pneumonia, which lasted for about a month. He is now feeling better, but still has a lingering cough. He uses an albuterol inhaler as needed, usually only when he is sick.  Reports no shortness of breath nor dyspnea on exertion. No edema, orthopnea, PND. Reports no palpitations.   Discussed the use of AI scribe software for clinical note transcription with the patient, who gave verbal consent to proceed.  ROS: persistent ROS in HPI  Studies Reviewed: Marland Kitchen   EKG Interpretation Date/Time:  Wednesday July 10 2023 14:04:13 EDT Ventricular Rate:  85 PR Interval:  156 QRS Duration:  96 QT Interval:  404 QTC Calculation: 480 R Axis:   35  Text Interpretation: Normal sinus rhythm Possible Inferior infarct (cited on or before 29-Nov-2015) When compared with ECG of 03-Nov-2017 17:27, T wave inversion less evident in Inferior leads T wave inversion no longer evident in Anterior leads Confirmed by Jari Favre [id number]) on 07/10/2023 2:17:38 PM   Coronary CTA 01/19/22  IMPRESSION: 1. Coronary artery calcium score 76.7 Agatston units. This places the patient in the 88th percentile for age and gender, suggesting high risk for future cardiac events.   2.  Mild nonobstructive CAD.   [employer] account executive   Electronically Signed:  By: Marca Ancona M.D. On: 01/19/2022 15:39      Physical Exam:   VS:  BP 108/70   Pulse 85   Ht 6' (1.829 m)   Wt 230 lb (104.3 kg)   SpO2 97%   BMI 31.19 kg/m    Wt Readings from Last 3 Encounters:  07/10/23 230 lb (104.3 kg)  06/17/23 225 lb (102.1 kg)  03/08/23 220 lb (99.8 kg)    GEN: Well nourished, well developed in no acute distress NECK: No JVD; No carotid bruits CARDIAC: RRR, no murmurs, rubs, gallops RESPIRATORY:  Clear to  auscultation without rales, wheezing or rhonchi  ABDOMEN: Soft, non-tender, non-distended EXTREMITIES:  No edema; No deformity   ASSESSMENT AND PLAN: .    Hypertriglyceridemia Chronic hypertriglyceridemia with lifelong elevated levels. Triglycerides remain slightly elevated despite Vascepa. Family history of cardiovascular disease. - Continue Vascepa. - Encourage dietary modifications to reduce carbohydrate intake. - Encourage regular exercise.  Hyperlipidemia LDL cholesterol well-controlled with Repatha and Lipitor. Missed last dose of Repatha. Tolerates current regimen well. Family history of statin intolerance. - Continue Repatha and Lipitor. - Re-administer Repatha as scheduled.  Preventive cardiology On low-dose aspirin for coronary artery disease prevention. Discussed recent article questioning aspirin use. Informed about potential gastrointestinal risks, mitigated by Protonix. - Continue low-dose aspirin 81 mg daily.  Gastroesophageal Reflux Disease (GERD) Significant improvement with Protonix. Reduced frequency of chest pain episodes. - Continue Protonix.  Asthma Asthma exacerbated by recent respiratory infections. Uses albuterol inhaler as needed. - Continue albuterol inhaler as needed.  Chronic back pain Chronic back pain secondary to surgeries. Managed with oxycodone, allowing increased activity. - Continue oxycodone as prescribed.      Dispo: Follow-up in a year with Dr. Tenny Craw  Signed, Sharlene Dory, PA-C

## 2023-12-16 ENCOUNTER — Telehealth: Payer: Self-pay | Admitting: Pharmacy Technician

## 2023-12-16 ENCOUNTER — Other Ambulatory Visit (HOSPITAL_COMMUNITY): Payer: Self-pay

## 2023-12-16 NOTE — Telephone Encounter (Signed)
 Pharmacy Patient Advocate Encounter   Received notification from Fax that prior authorization for repatha  is required/requested.   Insurance verification completed.   The patient is insured through [employer]) .   Per test claim: PA required; PA submitted to above mentioned insurance via Latent Key/confirmation #/EOC [id number] Status is pending

## 2023-12-16 NOTE — Telephone Encounter (Signed)
 Pharmacy Patient Advocate Encounter  Received notification from [employer] [place]  that Prior Authorization for repatha  has been APPROVED from 11/16/23 to 12/15/24. Ran test claim, Copay is $0.00 one month. This test claim was processed through [facility]- copay amounts may vary at other pharmacies due to pharmacy/plan contracts, or as the patient moves through the different stages of their insurance plan.   PA #/Case ID/Reference #: [id number]

## 2023-12-19 ENCOUNTER — Ambulatory Visit (INDEPENDENT_AMBULATORY_CARE_PROVIDER_SITE_OTHER): Payer: BC Managed Care – PPO | Admitting: Otolaryngology

## 2023-12-19 ENCOUNTER — Encounter (INDEPENDENT_AMBULATORY_CARE_PROVIDER_SITE_OTHER): Payer: Self-pay | Admitting: Otolaryngology

## 2023-12-19 VITALS — BP 127/86 | HR 80

## 2023-12-19 DIAGNOSIS — H6981 Other specified disorders of Eustachian tube, right ear: Secondary | ICD-10-CM

## 2023-12-19 DIAGNOSIS — H903 Sensorineural hearing loss, bilateral: Secondary | ICD-10-CM

## 2023-12-19 DIAGNOSIS — H608X1 Other otitis externa, right ear: Secondary | ICD-10-CM

## 2023-12-19 MED ORDER — MOMETASONE FUROATE 0.1 % EX CREA: TOPICAL_CREAM | CUTANEOUS | 3 refills | Status: AC

## 2023-12-20 NOTE — Progress Notes (Signed)
 Patient ID: Luke Wong, male   DOB: April 12, 1971, 53 y.o.   MRN: [id number]  Follow-up: Bilateral hearing loss, right ear eustachian tube dysfunction, chronic eczematous otitis externa  HPI: The patient is a 53 year old male who returns today for his follow-up evaluation.  The patient was previously seen for his bilateral hearing loss and right ear eustachian tube dysfunction.  He was treated with bilateral hearing aids and right myringotomy and T-tube placement.  He also has a history of eczematous otitis externa.  He was treated with Elocon  cream.  The patient returns today reporting occasional itchy sensation in his ears.  He has not noted any recent change in his hearing.  Currently he denies any otalgia, otorrhea, or vertigo.  Exam: General: Communicates without difficulty, well nourished, no acute distress. Head: Normocephalic, no evidence injury, no tenderness, facial buttresses intact without stepoff. Face/sinus: No tenderness to palpation and percussion. Facial movement is normal and symmetric. Eyes: PERRL, EOMI. No scleral icterus, conjunctivae clear. Neuro: CN II exam reveals vision grossly intact.  No nystagmus at any point of gaze. Ears: Auricles well formed without lesions.  Ear canals are intact with eczematous changes.  The right T-tube is in place and patent.  The left tympanic membrane is intact.  Nose: External evaluation reveals normal support and skin without lesions.  Dorsum is intact.  Anterior rhinoscopy reveals congested mucosa over anterior aspect of inferior turbinates and intact septum.  No purulence noted. Oral:  Oral cavity and oropharynx are intact, symmetric, without erythema or edema.  Mucosa is moist without lesions. Neck: Full range of motion without pain.  There is no significant lymphadenopathy.  No masses palpable.  Thyroid  bed within normal limits to palpation.  Parotid glands and submandibular glands equal bilaterally without mass.  Trachea is midline. Neuro:  CN 2-12  grossly intact.    Assessment: 1.  Chronic right eczematous otitis externa.  His symptom is currently under control with medication. 2.  Subjectively stable bilateral high-frequency sensorineural hearing loss. 3.  Right ear eustachian tube dysfunction.  His right T-tube is in place and patent.  Plan: 1.  The physical exam findings are reviewed with the patient. 2.  Elocon  cream refilled. 3.  Continue the use of his hearing aids. 4.  Continue dry ear precautions on the right side. 5.  The patient will return for reevaluation in 1 year, sooner if needed.

## 2023-12-23 ENCOUNTER — Other Ambulatory Visit: Payer: Self-pay | Admitting: Internal Medicine

## 2023-12-23 DIAGNOSIS — R931 Abnormal findings on diagnostic imaging of heart and coronary circulation: Secondary | ICD-10-CM

## 2023-12-23 DIAGNOSIS — E782 Mixed hyperlipidemia: Secondary | ICD-10-CM

## 2023-12-23 DIAGNOSIS — I25119 Atherosclerotic heart disease of native coronary artery with unspecified angina pectoris: Secondary | ICD-10-CM

## 2023-12-29 ENCOUNTER — Other Ambulatory Visit: Payer: Self-pay | Admitting: Internal Medicine

## 2023-12-29 DIAGNOSIS — I25119 Atherosclerotic heart disease of native coronary artery with unspecified angina pectoris: Secondary | ICD-10-CM

## 2023-12-29 DIAGNOSIS — E782 Mixed hyperlipidemia: Secondary | ICD-10-CM

## 2023-12-29 DIAGNOSIS — R931 Abnormal findings on diagnostic imaging of heart and coronary circulation: Secondary | ICD-10-CM

## 2024-03-11 ENCOUNTER — Telehealth: Payer: Self-pay | Admitting: Pharmacy Technician

## 2024-03-11 ENCOUNTER — Other Ambulatory Visit (HOSPITAL_COMMUNITY): Payer: Self-pay

## 2024-03-11 DIAGNOSIS — R931 Abnormal findings on diagnostic imaging of heart and coronary circulation: Secondary | ICD-10-CM

## 2024-03-11 DIAGNOSIS — I25119 Atherosclerotic heart disease of native coronary artery with unspecified angina pectoris: Secondary | ICD-10-CM

## 2024-03-11 DIAGNOSIS — E782 Mixed hyperlipidemia: Secondary | ICD-10-CM

## 2024-03-11 NOTE — Telephone Encounter (Addendum)
   Pharmacy Patient Advocate Encounter   Received notification from Onbase that prior authorization for icosapent  is required/requested.   Insurance verification completed.   The patient is insured through [employer].   Per test claim: PA required; PA submitted to above mentioned insurance via Latent Key/confirmation #/EOC [facility] Status is pending

## 2024-03-12 ENCOUNTER — Other Ambulatory Visit (HOSPITAL_COMMUNITY): Payer: Self-pay

## 2024-03-12 MED ORDER — VASCEPA 1 G PO CAPS: 2.0000 g | ORAL_CAPSULE | Freq: Two times a day (BID) | ORAL | 3 refills | Status: AC

## 2024-03-12 NOTE — Addendum Note (Signed)
 Addended by: CHAUVIGNE, Arabelle Bollig on: 03/12/2024 03:17 PM   Modules accepted: Orders

## 2024-03-12 NOTE — Telephone Encounter (Signed)
 Prescription has been sent in

## 2024-03-12 NOTE — Telephone Encounter (Signed)
 Hi, insurance approved brand vascepa  for the patient and the copay would be free. Can someone please send in the prescription as dispense as written per provider so the pharmacy can fill brand? Thank you!       The prior auth for icosapent  was denied -insurance stating brand must be filled

## 2024-05-16 ENCOUNTER — Other Ambulatory Visit: Payer: Self-pay | Admitting: Internal Medicine

## 2024-05-16 DIAGNOSIS — E782 Mixed hyperlipidemia: Secondary | ICD-10-CM

## 2024-05-16 DIAGNOSIS — I25119 Atherosclerotic heart disease of native coronary artery with unspecified angina pectoris: Secondary | ICD-10-CM

## 2024-05-16 DIAGNOSIS — R931 Abnormal findings on diagnostic imaging of heart and coronary circulation: Secondary | ICD-10-CM
